# Patient Record
Sex: Female | Born: 1965 | Race: Black or African American | Hispanic: No | Marital: Married | Smoking: Never smoker
Health system: Southern US, Community
[De-identification: ages and names within clinical notes are randomized; demographics above are authoritative.]

## PROBLEM LIST (undated history)

## (undated) DIAGNOSIS — K219 Gastro-esophageal reflux disease without esophagitis: Secondary | ICD-10-CM

## (undated) DIAGNOSIS — M1990 Unspecified osteoarthritis, unspecified site: Secondary | ICD-10-CM

## (undated) DIAGNOSIS — D259 Leiomyoma of uterus, unspecified: Secondary | ICD-10-CM

## (undated) DIAGNOSIS — I10 Essential (primary) hypertension: Secondary | ICD-10-CM

## (undated) DIAGNOSIS — Z9109 Other allergy status, other than to drugs and biological substances: Secondary | ICD-10-CM

## (undated) DIAGNOSIS — N809 Endometriosis, unspecified: Secondary | ICD-10-CM

## (undated) DIAGNOSIS — E7800 Pure hypercholesterolemia, unspecified: Secondary | ICD-10-CM

## (undated) HISTORY — PX: ABDOMINAL HYSTERECTOMY: SHX81

## (undated) HISTORY — DX: Other allergy status, other than to drugs and biological substances: Z91.09

## (undated) HISTORY — DX: Endometriosis, unspecified: N80.9

## (undated) HISTORY — DX: Pure hypercholesterolemia, unspecified: E78.00

## (undated) HISTORY — DX: Essential (primary) hypertension: I10

## (undated) HISTORY — PX: OOPHORECTOMY: SHX86

## (undated) HISTORY — DX: Leiomyoma of uterus, unspecified: D25.9

---

## 2004-08-17 ENCOUNTER — Ambulatory Visit: Payer: Self-pay | Admitting: Unknown Physician Specialty

## 2005-08-18 ENCOUNTER — Ambulatory Visit: Payer: Self-pay | Admitting: Unknown Physician Specialty

## 2006-08-24 ENCOUNTER — Ambulatory Visit: Payer: Self-pay | Admitting: Unknown Physician Specialty

## 2007-08-28 ENCOUNTER — Ambulatory Visit: Payer: Self-pay | Admitting: Unknown Physician Specialty

## 2008-02-25 ENCOUNTER — Ambulatory Visit: Payer: Self-pay | Admitting: Unknown Physician Specialty

## 2008-05-10 ENCOUNTER — Ambulatory Visit: Payer: Self-pay | Admitting: Internal Medicine

## 2008-05-12 ENCOUNTER — Ambulatory Visit: Payer: Self-pay | Admitting: Internal Medicine

## 2008-09-02 ENCOUNTER — Ambulatory Visit: Payer: Self-pay | Admitting: Unknown Physician Specialty

## 2009-09-07 ENCOUNTER — Ambulatory Visit: Payer: Self-pay | Admitting: Unknown Physician Specialty

## 2010-03-04 ENCOUNTER — Ambulatory Visit: Payer: Self-pay | Admitting: Internal Medicine

## 2010-03-07 ENCOUNTER — Emergency Department: Payer: Self-pay | Admitting: Internal Medicine

## 2010-09-08 ENCOUNTER — Ambulatory Visit: Payer: Self-pay | Admitting: Unknown Physician Specialty

## 2011-09-28 ENCOUNTER — Ambulatory Visit: Payer: Self-pay

## 2011-09-28 LAB — HM MAMMOGRAPHY

## 2012-09-21 ENCOUNTER — Other Ambulatory Visit: Payer: Self-pay | Admitting: Internal Medicine

## 2012-09-21 MED ORDER — OMEPRAZOLE 20 MG PO CPDR: 20.0000 mg | DELAYED_RELEASE_CAPSULE | Freq: Every day | ORAL | Status: DC

## 2012-09-21 NOTE — Telephone Encounter (Signed)
Patient has an appointment on 3.11.14  Omeprazole DR 20 mg capsule

## 2012-09-21 NOTE — Telephone Encounter (Signed)
Sent in to pharmacy.  

## 2012-09-24 ENCOUNTER — Telehealth: Payer: Self-pay | Admitting: *Deleted

## 2012-09-24 NOTE — Telephone Encounter (Signed)
Called [phone] for prior authorization form being faxed over

## 2012-09-27 ENCOUNTER — Telehealth: Payer: Self-pay | Admitting: Internal Medicine

## 2012-09-27 NOTE — Telephone Encounter (Signed)
Kathleen Gould, Kathleen Gould,  DOB-10-07-1965, PCP- Dale Liberty.  Patient was seen by Dr. Lorin Picket at [facility] (? Sp).  Last seen July, 2013.  Patient has an appt at [facility] on  March 11th, 2014.   She  Is concerned she will be running out of her medication prior to visit.  Patient in NKDA, Hx HTN.  Medications are as follows-  (1) Felodipine ER 5mg  daily   (2) Omeprazole 20mg  daily  (3) Triameterene-HCTZ 37.5-25mg  daily and Lipitor 10mg  daily.   Patient uses [facility] [phone].  Please contact patient for medication management until appt.  [phone]

## 2012-09-27 NOTE — Telephone Encounter (Signed)
Please call pt.  Ok to refill meds until appt.

## 2012-09-28 MED ORDER — ATORVASTATIN CALCIUM 10 MG PO TABS: 10.0000 mg | ORAL_TABLET | Freq: Every day | ORAL | Status: DC

## 2012-09-28 MED ORDER — FELODIPINE ER 5 MG PO TB24: 5.0000 mg | ORAL_TABLET | Freq: Every day | ORAL | Status: DC

## 2012-09-28 MED ORDER — OMEPRAZOLE 20 MG PO CPDR: 20.0000 mg | DELAYED_RELEASE_CAPSULE | Freq: Every day | ORAL | Status: DC

## 2012-09-28 MED ORDER — TRIAMTERENE-HCTZ 37.5-25 MG PO TABS: 1.0000 | ORAL_TABLET | Freq: Every day | ORAL | Status: DC

## 2012-09-28 NOTE — Telephone Encounter (Signed)
Sent in to pharmacy. Spoke to patient's husband who stated that she needed refills on all medications. He also stated that patient had not received refill on omeprazole.

## 2012-10-01 LAB — HM MAMMOGRAPHY

## 2012-10-04 ENCOUNTER — Telehealth: Payer: Self-pay | Admitting: *Deleted

## 2012-10-04 NOTE — Telephone Encounter (Signed)
Called patient regarding denial of prior authorization;no answer. Message was left.

## 2012-10-17 ENCOUNTER — Telehealth: Payer: Self-pay | Admitting: *Deleted

## 2012-10-17 ENCOUNTER — Other Ambulatory Visit: Payer: Self-pay | Admitting: General Practice

## 2012-10-17 MED ORDER — OMEPRAZOLE 20 MG PO CPDR: 20.0000 mg | DELAYED_RELEASE_CAPSULE | Freq: Every day | ORAL | Status: DC

## 2012-10-17 NOTE — Telephone Encounter (Signed)
2nd message was left with patient regarding insurance not covering prilosec;letter sent

## 2012-10-17 NOTE — Telephone Encounter (Signed)
Pt stated she would just pay for medication out of pocket. Pt has an appt on 3/11 filled the Rx for 90 days because the cost would be cheaper.

## 2012-10-18 ENCOUNTER — Telehealth: Payer: Self-pay | Admitting: *Deleted

## 2012-10-18 NOTE — Telephone Encounter (Signed)
Called [phone] for prior authorization in the Omeprazole 20 mg, it was approved a approval noticed will be faxed over now; approval time Feb. 06, 2014- Feb 20,2015

## 2012-11-05 ENCOUNTER — Encounter: Payer: Self-pay | Admitting: Internal Medicine

## 2012-11-05 DIAGNOSIS — I10 Essential (primary) hypertension: Secondary | ICD-10-CM

## 2012-11-05 DIAGNOSIS — K219 Gastro-esophageal reflux disease without esophagitis: Secondary | ICD-10-CM | POA: Insufficient documentation

## 2012-11-05 DIAGNOSIS — E7800 Pure hypercholesterolemia, unspecified: Secondary | ICD-10-CM

## 2012-11-05 DIAGNOSIS — D649 Anemia, unspecified: Secondary | ICD-10-CM

## 2012-11-06 ENCOUNTER — Encounter: Payer: Self-pay | Admitting: Internal Medicine

## 2012-11-06 ENCOUNTER — Ambulatory Visit (INDEPENDENT_AMBULATORY_CARE_PROVIDER_SITE_OTHER): Payer: BC Managed Care – PPO | Admitting: Internal Medicine

## 2012-11-06 VITALS — BP 120/80 | HR 94 | Temp 98.3°F | Ht 66.0 in | Wt 208.0 lb

## 2012-11-06 DIAGNOSIS — D649 Anemia, unspecified: Secondary | ICD-10-CM

## 2012-11-06 DIAGNOSIS — E7800 Pure hypercholesterolemia, unspecified: Secondary | ICD-10-CM

## 2012-11-06 DIAGNOSIS — Z9109 Other allergy status, other than to drugs and biological substances: Secondary | ICD-10-CM

## 2012-11-06 DIAGNOSIS — I10 Essential (primary) hypertension: Secondary | ICD-10-CM

## 2012-11-06 DIAGNOSIS — K219 Gastro-esophageal reflux disease without esophagitis: Secondary | ICD-10-CM

## 2012-11-06 LAB — COMPREHENSIVE METABOLIC PANEL
ALT: 14 U/L (ref 0–35)
AST: 14 U/L (ref 0–37)
BUN: 11 mg/dL (ref 6–23)
Calcium: 9.7 mg/dL (ref 8.4–10.5)
Chloride: 102 mEq/L (ref 96–112)
Creatinine, Ser: 0.8 mg/dL (ref 0.4–1.2)
Total Bilirubin: 1 mg/dL (ref 0.3–1.2)

## 2012-11-06 LAB — CBC WITH DIFFERENTIAL/PLATELET
Basophils Absolute: 0.1 10*3/uL (ref 0.0–0.1)
Basophils Relative: 1.5 % (ref 0.0–3.0)
Eosinophils Absolute: 0.1 10*3/uL (ref 0.0–0.7)
HCT: 41.8 % (ref 36.0–46.0)
Hemoglobin: 14.2 g/dL (ref 12.0–15.0)
Lymphocytes Relative: 26.1 % (ref 12.0–46.0)
Lymphs Abs: 1.4 10*3/uL (ref 0.7–4.0)
MCHC: 34 g/dL (ref 30.0–36.0)
MCV: 86.3 fl (ref 78.0–100.0)
Monocytes Absolute: 0.3 10*3/uL (ref 0.1–1.0)
Neutro Abs: 3.6 10*3/uL (ref 1.4–7.7)
RBC: 4.85 Mil/uL (ref 3.87–5.11)
RDW: 13.9 % (ref 11.5–14.6)

## 2012-11-06 LAB — LIPID PANEL
Cholesterol: 225 mg/dL — ABNORMAL HIGH (ref 0–200)
HDL: 55.2 mg/dL (ref >39.00)
Total CHOL/HDL Ratio: 4
Triglycerides: 89 mg/dL (ref 0.0–149.0)

## 2012-11-06 MED ORDER — AMOXICILLIN 875 MG PO TABS: 875.0000 mg | ORAL_TABLET | Freq: Two times a day (BID) | ORAL | Status: DC

## 2012-11-06 MED ORDER — ATORVASTATIN CALCIUM 10 MG PO TABS: 10.0000 mg | ORAL_TABLET | Freq: Every day | ORAL | Status: DC

## 2012-11-06 MED ORDER — OMEPRAZOLE 20 MG PO CPDR: 20.0000 mg | DELAYED_RELEASE_CAPSULE | Freq: Every day | ORAL | Status: DC

## 2012-11-06 MED ORDER — FELODIPINE ER 5 MG PO TB24: 5.0000 mg | ORAL_TABLET | Freq: Every day | ORAL | Status: DC

## 2012-11-06 MED ORDER — TRIAMTERENE-HCTZ 37.5-25 MG PO TABS: 1.0000 | ORAL_TABLET | Freq: Every day | ORAL | Status: DC

## 2012-11-13 ENCOUNTER — Other Ambulatory Visit: Payer: Self-pay | Admitting: Internal Medicine

## 2012-11-13 DIAGNOSIS — D696 Thrombocytopenia, unspecified: Secondary | ICD-10-CM

## 2012-11-13 DIAGNOSIS — E876 Hypokalemia: Secondary | ICD-10-CM

## 2012-11-13 NOTE — Progress Notes (Signed)
Order placed for follow up labs 

## 2012-11-15 ENCOUNTER — Encounter: Payer: Self-pay | Admitting: *Deleted

## 2012-11-19 ENCOUNTER — Encounter: Payer: Self-pay | Admitting: Internal Medicine

## 2012-11-19 DIAGNOSIS — Z9109 Other allergy status, other than to drugs and biological substances: Secondary | ICD-10-CM | POA: Insufficient documentation

## 2012-11-19 NOTE — Assessment & Plan Note (Signed)
Follow.  Check cbc.

## 2012-11-19 NOTE — Assessment & Plan Note (Signed)
Appears to be controlled if she watches what she eats and takes her omeprazole.  Follow.

## 2012-11-19 NOTE — Assessment & Plan Note (Signed)
Treat current sinusitis as outlined.  Follow.

## 2012-11-19 NOTE — Assessment & Plan Note (Signed)
Low cholesterol diet and exercise.  Follow.  Check lipid panel.   

## 2012-11-19 NOTE — Assessment & Plan Note (Signed)
Blood pressure is doing well on current regimen.  Check metabolic panel.  Follow.

## 2012-11-19 NOTE — Progress Notes (Signed)
  Subjective:    Patient ID: Kathleen Gould, female    DOB: 23-Mar-1966, 47 y.o.   MRN: [id number]  HPI 47 year old female with past history of uterine fibroids and endometriosis, hypertension and hypercholesterolemia who comes in today for a scheduled follow up.  She states she has been doing relatively well.  She does have increased nasal congestion and sinus pressure.  Some increased pressure in her ears.  Thick yellow mucus production.  No chest congestion.  No sob.  Acid reflux is controlled if she watches what she eats.  Tomasa Blase will aggravate.  On omeprazole.  Sees Dr Alben SpittleNedra Hai for her gyn care.  States she is up to date.  Had her mammogram last month.  Bowels stable.    Past Medical History  Diagnosis Date  . Hypertension   . Endometriosis   . Hypercholesterolemia   . Uterine fibroid   . Environmental allergies     Review of Systems Patient denies any headache, lightheadedness or dizziness.  Sinus pressure and congestion as outlined.  No chest pain, tightness or palpitations.  No increased shortness of breath.  No nausea or vomiting. On omeprazole.  Acid reflux appears to be controlled if she watches what she eats.  No abdominal pain or cramping.  No bowel change, such as diarrhea, constipation, BRBPR or melana.  No urine change.        Objective:   Physical Exam Filed Vitals:   11/06/12 1010  BP: 120/80  Pulse: 94  Temp: 98.3 F (36.8 C)   Blood pressure recheck:  128/72, pulse 99  47 year old female in no acute distress.   HEENT:  Nares- clear except for slightly erythematous turbinates.  Minimal tenderness to palpation over the sinuses.  Oropharynx - without lesions. NECK:  Supple.  Nontender.  No audible bruit.  HEART:  Appears to be regular. LUNGS:  No crackles or wheezing audible.  Respirations even and unlabored.  RADIAL PULSE:  Equal bilaterally.    BREASTS:  Performed by gyn.   ABDOMEN:  Soft, nontender.  Bowel sounds present and normal.  No audible abdominal  bruit.  GU:  Performed by gyn.    EXTREMITIES:  No increased edema present.  DP pulses palpable and equal bilaterally.          Assessment & Plan:  PROBABLE SINUSITIS.  Saline nasal spray and flonase as directed.  Mucinex/robitussin as directed.  Will give her a prescription for amoxicillin 875mg  if needed.  Follow.    HEALTH MAINTENANCE.  Breasts, pelvic and pap smears through [facility].  Up to date per her report.  Last mammogram I have is 09/28/11 - BiRADS I.  Obtain records of latest mammogram.  Colonoscopy 7/09 - internal hemorrhoids.

## 2012-11-29 ENCOUNTER — Telehealth: Payer: Self-pay | Admitting: *Deleted

## 2012-11-29 ENCOUNTER — Other Ambulatory Visit (INDEPENDENT_AMBULATORY_CARE_PROVIDER_SITE_OTHER): Payer: BC Managed Care – PPO

## 2012-11-29 DIAGNOSIS — E876 Hypokalemia: Secondary | ICD-10-CM

## 2012-11-29 DIAGNOSIS — D696 Thrombocytopenia, unspecified: Secondary | ICD-10-CM

## 2012-11-29 LAB — PLATELET COUNT: Platelets: 470 10*3/uL — ABNORMAL HIGH (ref 150–400)

## 2012-11-29 NOTE — Telephone Encounter (Signed)
Pt came in for labs and states that the last time she was here she it was for being nauseas and states that she tired to get a hold of someone to let them know that she still is not felling better and that she is still nausea and throwing up, also that she went to [facility] clinic on November 19, 2012 for the nausea and they did blood and urine test to see if they could find anything, she states they gave her phenergan and that didn't help at all.

## 2012-11-29 NOTE — Telephone Encounter (Signed)
If she is continuing to feel sick on her stomach - she needs reevaluation.  Need to know how often - emesis, etc.  Also need records from [place].  Any other symptoms.

## 2012-11-29 NOTE — Telephone Encounter (Signed)
Patient said that she vomited Saturday but she still feels nausea on and off all the. Has bad headaches but not all the time.

## 2012-11-30 ENCOUNTER — Ambulatory Visit (INDEPENDENT_AMBULATORY_CARE_PROVIDER_SITE_OTHER): Payer: BC Managed Care – PPO | Admitting: Internal Medicine

## 2012-11-30 ENCOUNTER — Encounter: Payer: Self-pay | Admitting: Internal Medicine

## 2012-11-30 ENCOUNTER — Ambulatory Visit: Payer: BC Managed Care – PPO

## 2012-11-30 VITALS — BP 130/68 | HR 86 | Temp 98.6°F | Resp 18 | Wt 206.5 lb

## 2012-11-30 DIAGNOSIS — K219 Gastro-esophageal reflux disease without esophagitis: Secondary | ICD-10-CM

## 2012-11-30 DIAGNOSIS — R110 Nausea: Secondary | ICD-10-CM

## 2012-11-30 DIAGNOSIS — I10 Essential (primary) hypertension: Secondary | ICD-10-CM

## 2012-11-30 NOTE — Telephone Encounter (Signed)
Patient called back and said that she could be in office by 4:00 pm

## 2012-11-30 NOTE — Telephone Encounter (Signed)
Left message for patient to return call.

## 2012-11-30 NOTE — Telephone Encounter (Signed)
See if she can come in today at the 11:30 spot.  Will need to be evaluated if symptoms are persistent.

## 2012-11-30 NOTE — Patient Instructions (Signed)
I want you to increase your prilosec (omeprazole) 20mg  - one tablet 2x/day.  I am also going to schedule you for an abdominal ultrasound.

## 2012-12-02 ENCOUNTER — Encounter: Payer: Self-pay | Admitting: Internal Medicine

## 2012-12-02 NOTE — Assessment & Plan Note (Signed)
Blood pressure is doing well on current regimen.  Check metabolic panel.  Follow.  

## 2012-12-02 NOTE — Progress Notes (Signed)
  Subjective:    Patient ID: Kathleen Gould, female    DOB: 1966-04-16, 47 y.o.   MRN: [id number]  HPI 47 year old female with past history of uterine fibroids and endometriosis, hypertension and hypercholesterolemia who comes in today as a work in with concerns regarding persistent nausea.  Last visit, she had issues with increased sinus congestion and drainage.  The nausea was felt to be related to the increased drainage.  Sinus treated and improved.  Still with persistent nausea.  11/17/12 - nausea and minimal headache.  Emesis x 2.  Since, has had persistent nausea.  No further emesis.  Decreased appetite.  Just not hungry.  Eating - knows she has to.  One runny stool, but since minimal constipation.  No significant bowel change.  Bowels regular.  [facility]Dr Particia Nearing).  Labs unrevealing.  Urine unrevealing.  In today, because of persistence.  Some better today.   Sees Dr Alben SpittleNedra Hai for her gyn care.  States she is up to date.   No change in her meds.  Taking prilosec.      Past Medical History  Diagnosis Date  . Hypertension   . Endometriosis   . Hypercholesterolemia   . Uterine fibroid   . Environmental allergies     Review of Systems Patient denies any headache now.  No lightheadedness or dizziness.  Sinus pressure and congestion have essentially resolved.  Minimal drainage.  No chest pain, tightness or palpitations.  No increased shortness of breath.  Nausea as outlined.  No further vomiting.  No abdominal pain or cramping.  No significant bowel change.  Possible minimal constipation.  No BRBPR or melana.  No urine change.  No significant acid reflux.  She is taking prilosec daily.   Has had phenergan.  Not helping.      Objective:   Physical Exam  Filed Vitals:   11/30/12 1615  BP: 130/68  Pulse: 86  Temp: 98.6 F (37 C)  Resp: 66   47 year old female in no acute distress.   HEENT:  Nares- clear.  Oropharynx - without lesions. NECK:  Supple.   Nontender.  No audible bruit.  HEART:  Appears to be regular. LUNGS:  No crackles or wheezing audible.  Respirations even and unlabored.  RADIAL PULSE:  Equal bilaterally.   ABDOMEN:  Soft, nontender.  Bowel sounds present and normal.  No audible abdominal bruit.    EXTREMITIES:  No increased edema present.  DP pulses palpable and equal bilaterally.          Assessment & Plan:  PREVIOUS SINUSITIS.  Essentially resolved.  Continue nasal sprays.  Follow.    PERSISTENT NAUSEA.  Unclear etiology.  Sinus infection essentially resolved.  Eating.  Does not appear to change for better or worse with eating.  No pain.  Decreased appetite.  No new meds.  Will check liver panel, amylase and lipase.  Also recheck cbc and met b.  Will increase prilosec to bid.  Check abdominal ultrasound.  Further w/up pending above results.  Some better today.    HEALTH MAINTENANCE.  Breasts, pelvic and pap smears through [facility].  Up to date per her report.  Last mammogram I have is 09/28/11 - BiRADS I.  Obtain records of latest mammogram.  Colonoscopy 7/09 - internal hemorrhoids.

## 2012-12-02 NOTE — Assessment & Plan Note (Signed)
On omeprazole daily.  Will increase to bid.  Follow.

## 2012-12-03 LAB — HEPATIC FUNCTION PANEL
Albumin: 4.2 g/dL (ref 3.5–5.2)
Alkaline Phosphatase: 53 U/L (ref 39–117)

## 2012-12-03 LAB — AMYLASE: Amylase: 50 U/L (ref 27–131)

## 2012-12-03 LAB — LIPASE: Lipase: 16 U/L (ref 11.0–59.0)

## 2012-12-19 ENCOUNTER — Ambulatory Visit: Payer: Self-pay | Admitting: Internal Medicine

## 2013-01-12 ENCOUNTER — Emergency Department: Payer: Self-pay | Admitting: Emergency Medicine

## 2013-02-21 ENCOUNTER — Encounter: Payer: Self-pay | Admitting: Internal Medicine

## 2013-03-07 ENCOUNTER — Ambulatory Visit (INDEPENDENT_AMBULATORY_CARE_PROVIDER_SITE_OTHER): Payer: BC Managed Care – PPO | Admitting: Internal Medicine

## 2013-03-07 ENCOUNTER — Encounter: Payer: Self-pay | Admitting: Internal Medicine

## 2013-03-07 VITALS — BP 120/70 | HR 86 | Temp 98.3°F | Ht 66.0 in | Wt 208.5 lb

## 2013-03-07 DIAGNOSIS — D649 Anemia, unspecified: Secondary | ICD-10-CM

## 2013-03-07 DIAGNOSIS — R350 Frequency of micturition: Secondary | ICD-10-CM

## 2013-03-07 DIAGNOSIS — Z9109 Other allergy status, other than to drugs and biological substances: Secondary | ICD-10-CM

## 2013-03-07 DIAGNOSIS — I10 Essential (primary) hypertension: Secondary | ICD-10-CM

## 2013-03-07 DIAGNOSIS — E7800 Pure hypercholesterolemia, unspecified: Secondary | ICD-10-CM

## 2013-03-07 DIAGNOSIS — K219 Gastro-esophageal reflux disease without esophagitis: Secondary | ICD-10-CM

## 2013-03-07 LAB — CBC WITH DIFFERENTIAL/PLATELET
Basophils Relative: 0.5 % (ref 0.0–3.0)
Hemoglobin: 13.7 g/dL (ref 12.0–15.0)
Lymphocytes Relative: 31.9 % (ref 12.0–46.0)
Monocytes Relative: 7.1 % (ref 3.0–12.0)
Neutro Abs: 3.1 10*3/uL (ref 1.4–7.7)
RBC: 4.66 Mil/uL (ref 3.87–5.11)

## 2013-03-07 LAB — BASIC METABOLIC PANEL
GFR: 78.82 mL/min (ref >60.00)
Glucose, Bld: 91 mg/dL (ref 70–99)
Potassium: 4.1 mEq/L (ref 3.5–5.1)
Sodium: 140 mEq/L (ref 135–145)

## 2013-03-07 LAB — URINALYSIS, ROUTINE W REFLEX MICROSCOPIC
Bilirubin Urine: NEGATIVE
Hgb urine dipstick: NEGATIVE
Ketones, ur: NEGATIVE
Leukocytes, UA: NEGATIVE
Nitrite: NEGATIVE
RBC / HPF: NONE SEEN
Specific Gravity, Urine: 1.02
Total Protein, Urine: NEGATIVE
Urine Glucose: NEGATIVE
Urobilinogen, UA: 0.2
pH: 8.5 (ref 5.0–8.0)

## 2013-03-07 LAB — HEPATIC FUNCTION PANEL
Albumin: 4.3 g/dL (ref 3.5–5.2)
Bilirubin, Direct: 0.1 mg/dL (ref 0.0–0.3)
Total Protein: 8 g/dL (ref 6.0–8.3)

## 2013-03-07 LAB — LIPID PANEL
Cholesterol: 275 mg/dL — ABNORMAL HIGH (ref 0–200)
HDL: 73.3 mg/dL (ref >39.00)
VLDL: 26 mg/dL (ref 0.0–40.0)

## 2013-03-07 LAB — LDL CHOLESTEROL, DIRECT: Direct LDL: 194.3 mg/dL

## 2013-03-10 ENCOUNTER — Encounter: Payer: Self-pay | Admitting: Internal Medicine

## 2013-03-10 LAB — CULTURE, URINE COMPREHENSIVE: Colony Count: 15000

## 2013-03-10 NOTE — Progress Notes (Signed)
  Subjective:    Patient ID: Kathleen Gould, female    DOB: November 30, 1965, 47 y.o.   MRN: [id number]  HPI 47 year old female with past history of uterine fibroids and endometriosis, hypertension and hypercholesterolemia who comes in today for a scheduled follow up.   Sees Dr Alben SpittleNedra Hai for her gyn care.  States she is up to date.   No change in her meds.      Past Medical History  Diagnosis Date  . Hypertension   . Endometriosis   . Hypercholesterolemia   . Uterine fibroid   . Environmental allergies     Review of Systems Patient denies any headache now.  No lightheadedness or dizziness.  Sinus pressure and congestion have essentially resolved.  Minimal drainage.  No chest pain, tightness or palpitations.  No increased shortness of breath.  Nausea as outlined.  No further vomiting.  No abdominal pain or cramping.  No significant bowel change.  Possible minimal constipation.  No BRBPR or melana.  No urine change.  No significant acid reflux.  She is taking prilosec daily.   Has had phenergan.  Not helping.      Objective:   Physical Exam  Filed Vitals:   03/07/13 0854  BP: 120/70  Pulse: 86  Temp: 98.3 F (36.8 C)   Blood pressure recheck:  122/76, pulse 32  47 year old female in no acute distress.   HEENT:  Nares- clear.  Oropharynx - without lesions. NECK:  Supple.  Nontender.  No audible bruit.  HEART:  Appears to be regular. LUNGS:  No crackles or wheezing audible.  Respirations even and unlabored.  RADIAL PULSE:  Equal bilaterally.   ABDOMEN:  Soft, nontender.  Bowel sounds present and normal.  No audible abdominal bruit.    EXTREMITIES:  No increased edema present.  DP pulses palpable and equal bilaterally.          Assessment & Plan:  PREVIOUS NAUSEA.  Resolved.  INCREASED URINARY FREQUENCY.  Will check urinalysis and culture to confirm no infection.     PREVIOUS HEAD INJURY.  Back to baseline.  All symptoms resolved.      HEALTH MAINTENANCE.  Breasts, pelvic  and pap smears through [facility].  Up to date per her report.  Last mammogram I have is 09/28/11 - BiRADS I.  Obtain records of latest mammogram.  Colonoscopy 7/09 - internal hemorrhoids.

## 2013-03-10 NOTE — Assessment & Plan Note (Signed)
Stable

## 2013-03-10 NOTE — Assessment & Plan Note (Signed)
Follow cbc.  

## 2013-03-10 NOTE — Assessment & Plan Note (Signed)
Blood pressure is doing well on current regimen.  Follow.   

## 2013-03-10 NOTE — Assessment & Plan Note (Signed)
Low cholesterol diet and exercise.  Follow lipid panel.   

## 2013-03-10 NOTE — Assessment & Plan Note (Signed)
On omeprazole.  Doing well.  Follow.  

## 2013-03-13 ENCOUNTER — Other Ambulatory Visit: Payer: Self-pay | Admitting: *Deleted

## 2013-03-13 MED ORDER — ATORVASTATIN CALCIUM 20 MG PO TABS: 20.0000 mg | ORAL_TABLET | Freq: Every day | ORAL | Status: DC

## 2013-03-13 MED ORDER — SULFAMETHOXAZOLE-TMP DS 800-160 MG PO TABS: 1.0000 | ORAL_TABLET | Freq: Two times a day (BID) | ORAL | Status: DC

## 2013-03-21 ENCOUNTER — Encounter: Payer: Self-pay | Admitting: Internal Medicine

## 2013-04-18 ENCOUNTER — Encounter: Payer: Self-pay | Admitting: Adult Health

## 2013-04-18 ENCOUNTER — Ambulatory Visit (INDEPENDENT_AMBULATORY_CARE_PROVIDER_SITE_OTHER): Payer: BC Managed Care – PPO | Admitting: Adult Health

## 2013-04-18 VITALS — BP 130/82 | HR 82 | Temp 98.0°F | Resp 12 | Wt 211.5 lb

## 2013-04-18 DIAGNOSIS — K5900 Constipation, unspecified: Secondary | ICD-10-CM

## 2013-04-18 NOTE — Assessment & Plan Note (Signed)
Patient reports 5 days of constipation with little results with fleets enema x 2. She presents with RLQ pain on exam. Chronic nausea. Cannot r/o chronic appendicitis given pain and nausea. Recommend abdominal CT. Patient refused. Stated it was too expensive. She felt the recent ultrasound should have sufficed. Explained that I could not safely recommend a cathartic with her RLQ pain and chronic nausea worsening in the last 5 days without identifying whether appendix involved and risk rupture. Patient stated she would come back and see Dr. Lorin Picket when she was here. In the meantime, patient stated "I will just take something over the counter".

## 2013-04-18 NOTE — Progress Notes (Signed)
  Subjective:    Patient ID: Kathleen Gould, female    DOB: 01/19/1966, 47 y.o.   MRN: [id number]  HPI  Patient presents to clinic with c/o of no BM since Saturday. She reports having a normal BM on Saturday. She has tried 2 fleets enema with little results. Reports eating regular size meals. No changes in diet. She eats lots of fruits and vegetables. Reports ongoing nausea "off and on" for months with worsening in the last 5 days. She denies hx of constipation. She normally moves her bowels daily. She is passing flatus. She feels bloated. She has not taken anything orally to relieve constipation. She c/o of occasional right sided abdominal pain. Recently had ultrasound which showed no gallstones and was essentially normal. Denies fever or other symptoms.   Current Outpatient Prescriptions on File Prior to Visit  Medication Sig Dispense Refill  . atorvastatin (LIPITOR) 20 MG tablet Take 1 tablet (20 mg total) by mouth daily.  30 tablet  5  . felodipine (PLENDIL) 5 MG 24 hr tablet Take 1 tablet (5 mg total) by mouth daily.  90 tablet  1  . omeprazole (PRILOSEC) 20 MG capsule Take 1 capsule (20 mg total) by mouth daily.  90 capsule  1  . triamterene-hydrochlorothiazide (MAXZIDE-25) 37.5-25 MG per tablet Take 1 each (1 tablet total) by mouth daily.  90 tablet  1   No current facility-administered medications on file prior to visit.    Review of Systems  Constitutional: Negative for fever.  Respiratory: Negative.   Cardiovascular: Negative.   Gastrointestinal: Positive for nausea, constipation and abdominal distention. Negative for blood in stool.       Reports abdominal pain right side - "not too severe"    BP 130/82  Pulse 82  Temp(Src) 98 F (36.7 C) (Oral)  Resp 12  Wt 211 lb 8 oz (95.936 kg)  BMI 34.15 kg/m2  SpO2 97%  LMP 11/06/2000    Objective:   Physical Exam  Constitutional: She is oriented to person, place, and time.  Obese female c/o constipation and occasional right  side pain.  Cardiovascular: Normal rate and regular rhythm.   Pulmonary/Chest: Effort normal. No respiratory distress.  Abdominal: Soft. Bowel sounds are normal. She exhibits distension. She exhibits no mass. There is tenderness. There is rebound. There is no guarding.  Neurological: She is alert and oriented to person, place, and time.  Skin: Skin is warm and dry.  Psychiatric: She has a normal mood and affect.      Assessment & Plan:

## 2013-07-02 ENCOUNTER — Other Ambulatory Visit: Payer: Self-pay | Admitting: Internal Medicine

## 2013-07-02 NOTE — Telephone Encounter (Signed)
Eprescribed.

## 2013-07-23 ENCOUNTER — Encounter: Payer: Self-pay | Admitting: Internal Medicine

## 2013-07-23 ENCOUNTER — Ambulatory Visit (INDEPENDENT_AMBULATORY_CARE_PROVIDER_SITE_OTHER): Payer: BC Managed Care – PPO | Admitting: Internal Medicine

## 2013-07-23 VITALS — BP 122/90 | HR 85 | Temp 98.3°F | Ht 65.75 in | Wt 208.2 lb

## 2013-07-23 DIAGNOSIS — Z1239 Encounter for other screening for malignant neoplasm of breast: Secondary | ICD-10-CM

## 2013-07-23 DIAGNOSIS — J329 Chronic sinusitis, unspecified: Secondary | ICD-10-CM

## 2013-07-23 DIAGNOSIS — R4586 Emotional lability: Secondary | ICD-10-CM

## 2013-07-23 DIAGNOSIS — Z9109 Other allergy status, other than to drugs and biological substances: Secondary | ICD-10-CM

## 2013-07-23 DIAGNOSIS — K5900 Constipation, unspecified: Secondary | ICD-10-CM

## 2013-07-23 DIAGNOSIS — Z1211 Encounter for screening for malignant neoplasm of colon: Secondary | ICD-10-CM

## 2013-07-23 DIAGNOSIS — D473 Essential (hemorrhagic) thrombocythemia: Secondary | ICD-10-CM

## 2013-07-23 DIAGNOSIS — D649 Anemia, unspecified: Secondary | ICD-10-CM

## 2013-07-23 DIAGNOSIS — E7800 Pure hypercholesterolemia, unspecified: Secondary | ICD-10-CM

## 2013-07-23 DIAGNOSIS — I10 Essential (primary) hypertension: Secondary | ICD-10-CM

## 2013-07-23 DIAGNOSIS — K219 Gastro-esophageal reflux disease without esophagitis: Secondary | ICD-10-CM

## 2013-07-23 DIAGNOSIS — F39 Unspecified mood [affective] disorder: Secondary | ICD-10-CM

## 2013-07-23 NOTE — Progress Notes (Signed)
Pre-visit discussion using our clinic review tool. No additional management support is needed unless otherwise documented below in the visit note.  

## 2013-07-23 NOTE — Progress Notes (Signed)
  Subjective:    Patient ID: Hal Morales, female    DOB: 1966/05/25, 47 y.o.   MRN: [id number]  HPI 47 year old female with past history of uterine fibroids and endometriosis, hypertension and hypercholesterolemia who comes in today to follow up on these issues as well as for a complete physical exam.  She reports having problems recently with increased nasal congestion.  Thick yellow mucus production.  No fever.  No chest congestion.  Has been using saline nasal flushes and has taken some mucinex.  Symptoms started approximately one month ago and gradually worsened.  Overall otherwise she feels she is doing well.  Breathing stable.  No nausea or vomiting.  No abdominal pain.  Bowels better.  No problems with constipation now.  She does report some mood swings.  Noticed more recently.        Past Medical History  Diagnosis Date  . Hypertension   . Endometriosis   . Hypercholesterolemia   . Uterine fibroid   . Environmental allergies     Outpatient Encounter Prescriptions as of 07/23/2013  Medication Sig  . atorvastatin (LIPITOR) 20 MG tablet Take 1 tablet (20 mg total) by mouth daily.  . felodipine (PLENDIL) 5 MG 24 hr tablet TAKE 1 TABLET BY MOUTH EVERY DAY  . omeprazole (PRILOSEC) 20 MG capsule TAKE 1 CAPSULE (20 MG TOTAL) BY MOUTH DAILY.  Marland Kitchen triamterene-hydrochlorothiazide (MAXZIDE-25) 37.5-25 MG per tablet TAKE 1 TABLET BY MOUTH EVERY DAY  . [DISCONTINUED] omeprazole (PRILOSEC) 20 MG capsule Take 1 capsule (20 mg total) by mouth daily.    Review of Systems Patient denies any headache.  No lightheadedness or dizziness.  Sinus pressure and congestion as outlined.  Thick mucus production.   Minimal drainage.  No chest pain, tightness or palpitations.  No increased shortness of breath.  No chest congestion.  No abdominal pain or cramping.  No bowel change now.  Constipation has improved.   No BRBPR or melana.  No urine change.  No significant acid reflux.  She is taking prilosec.       Objective:   Physical Exam  Filed Vitals:   07/23/13 1338  BP: 122/90  Pulse: 85  Temp: 98.3 F (36.8 C)   Blood pressure recheck:  118/78, pulse 9  47 year old female in no acute distress.   HEENT:  Nares- clear.  Oropharynx - without lesions. NECK:  Supple.  Nontender.  No audible bruit.  HEART:  Appears to be regular. LUNGS:  No crackles or wheezing audible.  Respirations even and unlabored.  RADIAL PULSE:  Equal bilaterally.    BREASTS:  No nipple discharge or nipple retraction present.  Could not appreciate any distinct nodules or axillary adenopathy.  ABDOMEN:  Soft, nontender.  Bowel sounds present and normal.  No audible abdominal bruit.    EXTREMITIES:  No increased edema present.  DP pulses palpable and equal bilaterally.          Assessment & Plan:  PREVIOUS NAUSEA.  Resolved.  HEALTH MAINTENANCE.  Physical today.  States had mammogram 09/2012.  Had at [facility].   Obtain records of latest mammogram.  Colonoscopy 7/09 - internal hemorrhoids.  IFOB given.  Schedule mammogram when due.

## 2013-07-24 ENCOUNTER — Other Ambulatory Visit (INDEPENDENT_AMBULATORY_CARE_PROVIDER_SITE_OTHER): Payer: BC Managed Care – PPO

## 2013-07-24 DIAGNOSIS — D649 Anemia, unspecified: Secondary | ICD-10-CM

## 2013-07-24 DIAGNOSIS — I10 Essential (primary) hypertension: Secondary | ICD-10-CM

## 2013-07-24 DIAGNOSIS — E7800 Pure hypercholesterolemia, unspecified: Secondary | ICD-10-CM

## 2013-07-24 LAB — TSH: TSH: 1.39 u[IU]/mL (ref 0.35–5.50)

## 2013-07-24 LAB — COMPREHENSIVE METABOLIC PANEL
AST: 16 U/L (ref 0–37)
Albumin: 4.1 g/dL (ref 3.5–5.2)
Alkaline Phosphatase: 64 U/L (ref 39–117)
CO2: 30 mEq/L (ref 19–32)
Chloride: 101 mEq/L (ref 96–112)
GFR: 88.59 mL/min (ref >60.00)
Glucose, Bld: 80 mg/dL (ref 70–99)
Potassium: 3.3 mEq/L — ABNORMAL LOW (ref 3.5–5.1)
Sodium: 136 mEq/L (ref 135–145)
Total Protein: 7.9 g/dL (ref 6.0–8.3)

## 2013-07-24 LAB — CBC WITH DIFFERENTIAL/PLATELET
Eosinophils Relative: 2.9 % (ref 0.0–5.0)
HCT: 41.1 % (ref 36.0–46.0)
Lymphocytes Relative: 31.4 % (ref 12.0–46.0)
MCV: 85.8 fl (ref 78.0–100.0)
Monocytes Absolute: 0.3 10*3/uL (ref 0.1–1.0)
Monocytes Relative: 5.8 % (ref 3.0–12.0)
Neutrophils Relative %: 59.3 % (ref 43.0–77.0)
Platelets: 457 10*3/uL — ABNORMAL HIGH (ref 150.0–400.0)
RBC: 4.79 Mil/uL (ref 3.87–5.11)
WBC: 4.7 10*3/uL (ref 4.5–10.5)

## 2013-07-24 LAB — LIPID PANEL
Total CHOL/HDL Ratio: 3
VLDL: 15.2 mg/dL (ref 0.0–40.0)

## 2013-07-28 ENCOUNTER — Encounter: Payer: Self-pay | Admitting: Internal Medicine

## 2013-07-28 DIAGNOSIS — F39 Unspecified mood [affective] disorder: Secondary | ICD-10-CM | POA: Insufficient documentation

## 2013-07-28 DIAGNOSIS — D75839 Thrombocytosis, unspecified: Secondary | ICD-10-CM | POA: Insufficient documentation

## 2013-07-28 DIAGNOSIS — J329 Chronic sinusitis, unspecified: Secondary | ICD-10-CM | POA: Insufficient documentation

## 2013-07-28 DIAGNOSIS — D473 Essential (hemorrhagic) thrombocythemia: Secondary | ICD-10-CM | POA: Insufficient documentation

## 2013-07-28 DIAGNOSIS — R4586 Emotional lability: Secondary | ICD-10-CM | POA: Insufficient documentation

## 2013-07-28 NOTE — Assessment & Plan Note (Signed)
Low cholesterol diet and exercise.  Follow lipid panel.   

## 2013-07-28 NOTE — Assessment & Plan Note (Signed)
Recheck cbc.  Follow.  

## 2013-07-28 NOTE — Assessment & Plan Note (Signed)
mucinex as directed.  Saline flushes.  Antibiotic if needed or if symptoms worsen.

## 2013-07-28 NOTE — Assessment & Plan Note (Signed)
Blood pressure is doing well on current regimen.  Follow.   

## 2013-07-28 NOTE — Assessment & Plan Note (Signed)
Resolved.  Not an issue now.   

## 2013-07-28 NOTE — Assessment & Plan Note (Signed)
Follow cbc.  

## 2013-07-28 NOTE — Assessment & Plan Note (Signed)
On omeprazole.  Doing well.  Follow.  

## 2013-07-28 NOTE — Assessment & Plan Note (Signed)
Stable

## 2013-07-28 NOTE — Assessment & Plan Note (Signed)
Has noticed some increased in mood swings.  Follow.  Hold on further medication at this time.  Follow.

## 2013-07-29 ENCOUNTER — Telehealth: Payer: Self-pay | Admitting: Internal Medicine

## 2013-07-29 MED ORDER — AMOXICILLIN 875 MG PO TABS: 875.0000 mg | ORAL_TABLET | Freq: Two times a day (BID) | ORAL | Status: DC

## 2013-07-29 NOTE — Telephone Encounter (Signed)
Patient Information:  Caller Name: Kathleen Gould  Phone: [phone]  Patient: Kathleen Gould  Gender: Female  DOB: [birth year]  Age: 47 Years  PCP: Kathleen Gould  Pregnant: No  Office Follow Up:  Does the office need to follow up with this patient?: Yes  Instructions For The Office: Request abx  RN Note:  Continue previous instructions by provider for cold s/s.  Symptoms  Reason For Call & Symptoms: Congested, nauseated, cough that is dry; Onset off and on x 1 month but seems worse since wknd.  Seen in office 07/23/13 and following home care instructions but feels worse today 07/29/13.  Reviewed Health History In EMR: Yes  Reviewed Medications In EMR: Yes  Reviewed Allergies In EMR: Yes  Reviewed Surgeries / Procedures: Yes  Date of Onset of Symptoms: 08/02/2013  Treatments Tried: Mucinex, Robitussin, Flonase  Treatments Tried Worked: No OB / GYN:  LMP: Unknown  Guideline(s) Used:  Colds  Disposition Per Guideline:   See Today or Tomorrow in Office  Reason For Disposition Reached:   Sinus congestion (pressure, fullness) present > 10 days  Advice Given:  Call Back If:  Difficulty breathing occurs  Fever lasts more than 3 days  Nasal discharge lasts more than 10 days  Cough lasts more than 3 weeks  You become worse  Patient Refused Recommendation:  Patient Requests Prescription  Prefers script for abx vs ov due to seen 07/23/13.

## 2013-07-29 NOTE — Telephone Encounter (Signed)
Spoke to pt.  She was just evaluated.  Informed her if symptoms did not improve with what was suggested at the visit, that we would prescribe abx.  Amoxicillin 875mg  bid x 10 days.  She was instructed to call if symptoms changed, worsened or did not resolve.  Offered evaluation.  She did not feel needed eval.

## 2013-08-14 ENCOUNTER — Encounter: Payer: Self-pay | Admitting: Internal Medicine

## 2013-08-15 ENCOUNTER — Encounter: Payer: Self-pay | Admitting: *Deleted

## 2013-08-15 NOTE — Progress Notes (Signed)
Mailed letter °

## 2013-08-23 ENCOUNTER — Telehealth: Payer: Self-pay | Admitting: Internal Medicine

## 2013-08-23 NOTE — Telephone Encounter (Signed)
States she received letter to call regarding results and possible changes in meds.  States she was to come back in for f/u testing w/in a week but not sure if now is best time due to persistent diarrhea and not feeling well.  Asking for a call.

## 2013-09-04 MED ORDER — ROSUVASTATIN CALCIUM 5 MG PO TABS: ORAL_TABLET | ORAL | Status: DC

## 2013-09-04 NOTE — Telephone Encounter (Signed)
Pt agreed to try the Crestor 5mg  on Monday, Wed, & Friday. Sent Rx to [place]

## 2013-09-04 NOTE — Addendum Note (Signed)
Addended by: Leeanne Rio on: 09/04/2013 03:57 PM   Modules accepted: Orders

## 2013-10-31 ENCOUNTER — Ambulatory Visit: Payer: Self-pay | Admitting: Internal Medicine

## 2013-10-31 LAB — HM MAMMOGRAPHY: HM Mammogram: NEGATIVE

## 2013-11-02 ENCOUNTER — Encounter: Payer: Self-pay | Admitting: Internal Medicine

## 2013-11-21 ENCOUNTER — Encounter: Payer: Self-pay | Admitting: Internal Medicine

## 2013-11-21 ENCOUNTER — Ambulatory Visit (INDEPENDENT_AMBULATORY_CARE_PROVIDER_SITE_OTHER): Payer: BC Managed Care – PPO | Admitting: Internal Medicine

## 2013-11-21 VITALS — BP 110/70 | HR 91 | Temp 98.5°F | Ht 65.75 in | Wt 209.0 lb

## 2013-11-21 DIAGNOSIS — D473 Essential (hemorrhagic) thrombocythemia: Secondary | ICD-10-CM

## 2013-11-21 DIAGNOSIS — D649 Anemia, unspecified: Secondary | ICD-10-CM

## 2013-11-21 DIAGNOSIS — E7800 Pure hypercholesterolemia, unspecified: Secondary | ICD-10-CM

## 2013-11-21 DIAGNOSIS — K5900 Constipation, unspecified: Secondary | ICD-10-CM

## 2013-11-21 DIAGNOSIS — I10 Essential (primary) hypertension: Secondary | ICD-10-CM

## 2013-11-21 DIAGNOSIS — D75839 Thrombocytosis, unspecified: Secondary | ICD-10-CM

## 2013-11-21 DIAGNOSIS — Z9109 Other allergy status, other than to drugs and biological substances: Secondary | ICD-10-CM

## 2013-11-21 DIAGNOSIS — K219 Gastro-esophageal reflux disease without esophagitis: Secondary | ICD-10-CM

## 2013-11-21 NOTE — Assessment & Plan Note (Addendum)
On omeprazole.  Some nausea.  Consider increasing omeprazole to bid.  Follow.  Get her back in soon to reassess.  Treat allergies.

## 2013-11-21 NOTE — Progress Notes (Signed)
Pre-visit discussion using our clinic review tool. No additional management support is needed unless otherwise documented below in the visit note.  

## 2013-11-21 NOTE — Progress Notes (Signed)
  Subjective:    Patient ID: Kathleen Gould, female    DOB: 17-Feb-1966, 48 y.o.   MRN: [id number]  HPI 48 year old female with past history of uterine fibroids and endometriosis, hypertension and hypercholesterolemia who comes in today for a scheduled follow up.   She reports having problems recently with increased nasal congestion, head congestion, increased post nasal drainage and cough.  No fever.  No chest congestion.  Has been using saline nasal flushes and has taken some robitussin.  Some nausea associated.  No vomiting.   Breathing stable.  No abdominal pain.  Bowels stable.  No problems with constipation now.  Does report more gas.  Has cut back milk.         Past Medical History  Diagnosis Date  . Hypertension   . Endometriosis   . Hypercholesterolemia   . Uterine fibroid   . Environmental allergies     Outpatient Encounter Prescriptions as of 11/21/2013  Medication Sig  . felodipine (PLENDIL) 5 MG 24 hr tablet TAKE 1 TABLET BY MOUTH EVERY DAY  . fluticasone (FLONASE) 50 MCG/ACT nasal spray Place 2 sprays into both nostrils daily.  Marland Kitchen omeprazole (PRILOSEC) 20 MG capsule TAKE 1 CAPSULE (20 MG TOTAL) BY MOUTH DAILY.  . rosuvastatin (CRESTOR) 5 MG tablet Take one tablet on Monday, Wednesday, & Friday  . triamterene-hydrochlorothiazide (MAXZIDE-25) 37.5-25 MG per tablet TAKE 1 TABLET BY MOUTH EVERY DAY  . [DISCONTINUED] amoxicillin (AMOXIL) 875 MG tablet Take 1 tablet (875 mg total) by mouth 2 (two) times daily.    Review of Systems Patient denies any headache.  No lightheadedness or dizziness.  Sinus pressure and congestion as outlined.  Thick mucus production.   Minimal drainage.  No chest pain, tightness or palpitations.  No increased shortness of breath.  No chest congestion.  No abdominal pain or cramping.  No bowel change now.  Constipation has improved.   No BRBPR or melana.  No urine change.  No significant acid reflux.  She is taking prilosec.      Objective:   Physical  Exam  Filed Vitals:   11/21/13 1347  BP: 110/70  Pulse: 91  Temp: 98.5 F (36.9 C)   Blood pressure recheck:  47/21  48 year old female in no acute distress.   HEENT:  Nares- clear with slightly erythematous turbinates.   Oropharynx - without lesions.  No significant tenderness to palpation over the sinuses.  TMs visualized without erythema.   NECK:  Supple.  Nontender.  No audible bruit.  HEART:  Appears to be regular. LUNGS:  No crackles or wheezing audible.  Respirations even and unlabored.  RADIAL PULSE:  Equal bilaterally.   ABDOMEN:  Soft, nontender.  Bowel sounds present and normal.  No audible abdominal bruit.    EXTREMITIES:  No increased edema present.  DP pulses palpable and equal bilaterally.          Assessment & Plan:  PREVIOUS NAUSEA.  Had resolved.  Now with nausea - question related to the drainage.  Treat allergies.  Increase omeprazole.  Follow.    HEALTH MAINTENANCE.  Physical 07/23/13.  Colonoscopy 7/09 - internal hemorrhoids.   Mammogram 10/31/13 - Birads I.     I spent over 25 minutes with the patient and more than 50% of the time was spent in consultation regarding the above.

## 2013-11-24 ENCOUNTER — Encounter: Payer: Self-pay | Admitting: Internal Medicine

## 2013-11-24 NOTE — Assessment & Plan Note (Signed)
Blood pressure is doing well on current regimen.  Follow.   

## 2013-11-24 NOTE — Assessment & Plan Note (Signed)
Follow cbc.  

## 2013-11-24 NOTE — Assessment & Plan Note (Signed)
Take antihistamine daily.  Saline nasal spray and steroid nasal spray as directed.  Treat reflux.  Follow.

## 2013-11-24 NOTE — Assessment & Plan Note (Signed)
Not a significant issue for her now.  Follow.   

## 2013-11-24 NOTE — Assessment & Plan Note (Signed)
Low cholesterol diet and exercise.  Follow lipid panel.   

## 2013-12-02 ENCOUNTER — Other Ambulatory Visit (INDEPENDENT_AMBULATORY_CARE_PROVIDER_SITE_OTHER): Payer: BC Managed Care – PPO

## 2013-12-02 DIAGNOSIS — I10 Essential (primary) hypertension: Secondary | ICD-10-CM

## 2013-12-02 DIAGNOSIS — D75839 Thrombocytosis, unspecified: Secondary | ICD-10-CM

## 2013-12-02 DIAGNOSIS — E7800 Pure hypercholesterolemia, unspecified: Secondary | ICD-10-CM

## 2013-12-02 DIAGNOSIS — Z1211 Encounter for screening for malignant neoplasm of colon: Secondary | ICD-10-CM

## 2013-12-02 DIAGNOSIS — D473 Essential (hemorrhagic) thrombocythemia: Secondary | ICD-10-CM

## 2013-12-02 LAB — BASIC METABOLIC PANEL
BUN: 12 mg/dL (ref 6–23)
CALCIUM: 10.1 mg/dL (ref 8.4–10.5)
CO2: 28 meq/L (ref 19–32)
CREATININE: 0.8 mg/dL (ref 0.4–1.2)
Chloride: 101 mEq/L (ref 96–112)
GFR: 85.78 mL/min (ref >60.00)
GLUCOSE: 89 mg/dL (ref 70–99)
Potassium: 3.5 mEq/L (ref 3.5–5.1)
Sodium: 139 mEq/L (ref 135–145)

## 2013-12-02 LAB — CBC WITH DIFFERENTIAL/PLATELET
Basophils Absolute: 0 10*3/uL (ref 0.0–0.1)
Basophils Relative: 0.4 % (ref 0.0–3.0)
Eosinophils Absolute: 0.1 10*3/uL (ref 0.0–0.7)
Eosinophils Relative: 1.9 % (ref 0.0–5.0)
HCT: 41.5 % (ref 36.0–46.0)
Hemoglobin: 14 g/dL (ref 12.0–15.0)
LYMPHS PCT: 26.9 % (ref 12.0–46.0)
Lymphs Abs: 1.5 10*3/uL (ref 0.7–4.0)
MCHC: 33.7 g/dL (ref 30.0–36.0)
MCV: 86.3 fl (ref 78.0–100.0)
MONOS PCT: 5.9 % (ref 3.0–12.0)
Monocytes Absolute: 0.3 10*3/uL (ref 0.1–1.0)
NEUTROS ABS: 3.6 10*3/uL (ref 1.4–7.7)
Neutrophils Relative %: 64.9 % (ref 43.0–77.0)
PLATELETS: 467 10*3/uL — AB (ref 150.0–400.0)
RBC: 4.81 Mil/uL (ref 3.87–5.11)
RDW: 14.3 % (ref 11.5–14.6)
WBC: 5.5 10*3/uL (ref 4.5–10.5)

## 2013-12-02 LAB — HEPATIC FUNCTION PANEL
ALBUMIN: 4.2 g/dL (ref 3.5–5.2)
ALK PHOS: 64 U/L (ref 39–117)
ALT: 15 U/L (ref 0–35)
AST: 15 U/L (ref 0–37)
BILIRUBIN TOTAL: 0.7 mg/dL (ref 0.3–1.2)
Bilirubin, Direct: 0 mg/dL (ref 0.0–0.3)
Total Protein: 8.4 g/dL — ABNORMAL HIGH (ref 6.0–8.3)

## 2013-12-02 LAB — LIPID PANEL
CHOLESTEROL: 271 mg/dL — AB (ref 0–200)
HDL: 78.5 mg/dL (ref >39.00)
LDL Cholesterol: 175 mg/dL — ABNORMAL HIGH (ref 0–99)
Total CHOL/HDL Ratio: 3
Triglycerides: 89 mg/dL (ref 0.0–149.0)
VLDL: 17.8 mg/dL (ref 0.0–40.0)

## 2013-12-04 ENCOUNTER — Other Ambulatory Visit: Payer: Self-pay | Admitting: *Deleted

## 2013-12-04 MED ORDER — ROSUVASTATIN CALCIUM 5 MG PO TABS: 5.0000 mg | ORAL_TABLET | Freq: Every day | ORAL | Status: DC

## 2013-12-31 ENCOUNTER — Encounter: Payer: Self-pay | Admitting: Internal Medicine

## 2013-12-31 ENCOUNTER — Ambulatory Visit (INDEPENDENT_AMBULATORY_CARE_PROVIDER_SITE_OTHER): Payer: BC Managed Care – PPO | Admitting: Internal Medicine

## 2013-12-31 VITALS — BP 110/70 | HR 91 | Temp 98.2°F | Ht 65.75 in | Wt 209.8 lb

## 2013-12-31 DIAGNOSIS — D473 Essential (hemorrhagic) thrombocythemia: Secondary | ICD-10-CM

## 2013-12-31 DIAGNOSIS — IMO0001 Reserved for inherently not codable concepts without codable children: Secondary | ICD-10-CM

## 2013-12-31 DIAGNOSIS — M79609 Pain in unspecified limb: Secondary | ICD-10-CM

## 2013-12-31 DIAGNOSIS — F39 Unspecified mood [affective] disorder: Secondary | ICD-10-CM

## 2013-12-31 DIAGNOSIS — M791 Myalgia, unspecified site: Secondary | ICD-10-CM

## 2013-12-31 DIAGNOSIS — D75839 Thrombocytosis, unspecified: Secondary | ICD-10-CM

## 2013-12-31 DIAGNOSIS — M79603 Pain in arm, unspecified: Secondary | ICD-10-CM

## 2013-12-31 DIAGNOSIS — R4586 Emotional lability: Secondary | ICD-10-CM

## 2013-12-31 DIAGNOSIS — E8809 Other disorders of plasma-protein metabolism, not elsewhere classified: Secondary | ICD-10-CM

## 2013-12-31 DIAGNOSIS — R779 Abnormality of plasma protein, unspecified: Secondary | ICD-10-CM

## 2013-12-31 DIAGNOSIS — I10 Essential (primary) hypertension: Secondary | ICD-10-CM

## 2013-12-31 DIAGNOSIS — K219 Gastro-esophageal reflux disease without esophagitis: Secondary | ICD-10-CM

## 2013-12-31 DIAGNOSIS — J329 Chronic sinusitis, unspecified: Secondary | ICD-10-CM

## 2013-12-31 MED ORDER — TRIAMTERENE-HCTZ 37.5-25 MG PO TABS: 1.0000 | ORAL_TABLET | Freq: Every day | ORAL | Status: DC

## 2013-12-31 MED ORDER — FELODIPINE ER 5 MG PO TB24
5.0000 mg | ORAL_TABLET | Freq: Every day | ORAL | Status: DC
Start: 2013-12-31 — End: 2014-07-06

## 2013-12-31 MED ORDER — OMEPRAZOLE 20 MG PO CPDR: 20.0000 mg | DELAYED_RELEASE_CAPSULE | Freq: Every day | ORAL | Status: DC

## 2013-12-31 MED ORDER — FLUTICASONE PROPIONATE 50 MCG/ACT NA SUSP: 2.0000 | Freq: Every day | NASAL | Status: DC

## 2013-12-31 NOTE — Progress Notes (Signed)
Pre visit review using our clinic review tool, if applicable. No additional management support is needed unless otherwise documented below in the visit note. 

## 2013-12-31 NOTE — Assessment & Plan Note (Addendum)
Not reported as a significant issues today.  Follow.

## 2013-12-31 NOTE — Progress Notes (Signed)
  Subjective:    Patient ID: Kathleen Gould, female    DOB: [birth year], 48 y.o.   MRN: [id number]  HPI 48 year old female with past history of uterine fibroids and endometriosis, hypertension and hypercholesterolemia who comes in today for a scheduled follow up.  She is on crestor now.  Has noticed increased pain in her right shoulder, right elbow and right hip. Feels similar to previous symptoms while on cholesterol medication.  Increased aching.  No injury.  The nausea has resolved.   No vomiting.   Breathing stable.  No abdominal pain.  Bowels stable.  No problems with constipation now.       Past Medical History  Diagnosis Date  . Hypertension   . Endometriosis   . Hypercholesterolemia   . Uterine fibroid   . Environmental allergies     Outpatient Encounter Prescriptions as of 12/31/2013  Medication Sig  . felodipine (PLENDIL) 5 MG 24 hr tablet TAKE 1 TABLET BY MOUTH EVERY DAY  . fluticasone (FLONASE) 50 MCG/ACT nasal spray Place 2 sprays into both nostrils daily.  Marland Kitchen omeprazole (PRILOSEC) 20 MG capsule TAKE 1 CAPSULE (20 MG TOTAL) BY MOUTH DAILY.  . rosuvastatin (CRESTOR) 5 MG tablet Take 1 tablet (5 mg total) by mouth daily.  Marland Kitchen triamterene-hydrochlorothiazide (MAXZIDE-25) 37.5-25 MG per tablet TAKE 1 TABLET BY MOUTH EVERY DAY    Review of Systems Patient denies any headache.  No lightheadedness or dizziness.  No sinus pressure or congestion now.   No chest pain, tightness or palpitations.  No increased shortness of breath.  No chest congestion.  No abdominal pain or cramping.  No bowel change now.  Constipation has improved.   No BRBPR or melana.  No urine change.  No significant acid reflux.  She is taking prilosec.  Nausea has resolved.  Right shoulder, elbow and hip pain and aching as outlined.       Objective:   Physical Exam  Filed Vitals:   12/31/13 1455  BP: 110/70  Pulse: 91  Temp: 98.2 F (36.8 C)   Blood pressure recheck:  25/20  48 year old female in no acute  distress.   HEENT:  Nares- clear.  Oropharynx - without lesions.    NECK:  Supple.  Nontender.  No audible bruit.  HEART:  Appears to be regular. LUNGS:  No crackles or wheezing audible.  Respirations even and unlabored.  RADIAL PULSE:  Equal bilaterally.   ABDOMEN:  Soft, nontender.  Bowel sounds present and normal.  No audible abdominal bruit.    EXTREMITIES:  No increased edema present.  DP pulses palpable and equal bilaterally.   MSK:  Some increased discomfort to palpation over the right arm.  Some increased pain with abduction right upper arm.         Assessment & Plan:  PREVIOUS NAUSEA.  Has resolved.      HEALTH MAINTENANCE.  Physical 07/23/13.  Colonoscopy 7/09 - internal hemorrhoids.   Mammogram 10/31/13 - Birads I.

## 2014-01-01 LAB — CBC WITH DIFFERENTIAL/PLATELET
Basophils Absolute: 0 10*3/uL (ref 0.0–0.1)
Basophils Relative: 0.5 % (ref 0.0–3.0)
EOS PCT: 1.3 % (ref 0.0–5.0)
Eosinophils Absolute: 0.1 10*3/uL (ref 0.0–0.7)
HEMATOCRIT: 40.1 % (ref 36.0–46.0)
HEMOGLOBIN: 13.5 g/dL (ref 12.0–15.0)
LYMPHS ABS: 2.1 10*3/uL (ref 0.7–4.0)
Lymphocytes Relative: 36.2 % (ref 12.0–46.0)
MCHC: 33.6 g/dL (ref 30.0–36.0)
MCV: 86.5 fl (ref 78.0–100.0)
Monocytes Absolute: 0.3 10*3/uL (ref 0.1–1.0)
Monocytes Relative: 5.6 % (ref 3.0–12.0)
NEUTROS ABS: 3.2 10*3/uL (ref 1.4–7.7)
Neutrophils Relative %: 56.4 % (ref 43.0–77.0)
Platelets: 443 10*3/uL — ABNORMAL HIGH (ref 150.0–400.0)
RBC: 4.64 Mil/uL (ref 3.87–5.11)
RDW: 13.8 % (ref 11.5–15.5)
WBC: 5.7 10*3/uL (ref 4.0–10.5)

## 2014-01-01 LAB — CK: CK TOTAL: 191 U/L — AB (ref 7–177)

## 2014-01-01 LAB — HEPATIC FUNCTION PANEL
ALBUMIN: 4.4 g/dL (ref 3.5–5.2)
ALT: 16 U/L (ref 0–35)
AST: 17 U/L (ref 0–37)
Alkaline Phosphatase: 59 U/L (ref 39–117)
Bilirubin, Direct: 0.1 mg/dL (ref 0.0–0.3)
TOTAL PROTEIN: 7.9 g/dL (ref 6.0–8.3)
Total Bilirubin: 0.5 mg/dL (ref 0.2–1.2)

## 2014-01-03 ENCOUNTER — Other Ambulatory Visit: Payer: Self-pay | Admitting: Internal Medicine

## 2014-01-03 DIAGNOSIS — R748 Abnormal levels of other serum enzymes: Secondary | ICD-10-CM

## 2014-01-03 NOTE — Progress Notes (Signed)
Order placed for f/u ck

## 2014-01-05 ENCOUNTER — Encounter: Payer: Self-pay | Admitting: Internal Medicine

## 2014-01-05 DIAGNOSIS — M79603 Pain in arm, unspecified: Secondary | ICD-10-CM | POA: Insufficient documentation

## 2014-01-05 NOTE — Assessment & Plan Note (Signed)
Blood pressure is doing well on current regimen.  Follow.   

## 2014-01-05 NOTE — Assessment & Plan Note (Signed)
Right shoulder and elbow pain as outlined.  Feels similar to when previously taking cholesterol medication.  Will stop the crestor.  Check ck. Call with update in 2-3 weeks.  See if symptoms resolve.  Further w/up pending response.

## 2014-01-05 NOTE — Assessment & Plan Note (Signed)
Resolved

## 2014-01-05 NOTE — Assessment & Plan Note (Signed)
On omeprazole.  Nausea resolved.

## 2014-01-05 NOTE — Assessment & Plan Note (Signed)
Follow cbc.  

## 2014-01-21 ENCOUNTER — Telehealth: Payer: Self-pay | Admitting: Internal Medicine

## 2014-01-21 NOTE — Telephone Encounter (Signed)
Pt left vm stating she has nausea and stomach pain.  Returned pt call, LMTCB.

## 2014-02-24 ENCOUNTER — Telehealth: Payer: Self-pay | Admitting: Internal Medicine

## 2014-02-24 NOTE — Telephone Encounter (Signed)
Duplicate.  See attached.   

## 2014-02-24 NOTE — Telephone Encounter (Signed)
Patient Information:  Caller Name: Braylie  Phone: [phone]  Patient: Kathleen Gould  Gender: Female  DOB: 31-Oct-1965  Age: 48 Years  PCP: Einar Pheasant  Pregnant: No  Office Follow Up:  Does the office need to follow up with this patient?: Yes  Instructions For The Office: PCP is booked today. Patient declines another provider or office.  Pleaes contact .  RN Note:  PCP is booked today. Patient declines another provider or office.  Pleaes contact .  Symptoms  Reason For Call & Symptoms: Patient reports pain to mid lower back and raidates from right flank to right mid abdomen. Onset off/on for three weeks.  She states she original thought it was UTI and was at  [facility] but it was not a UTI. Pain has continued and has worsened. +Nausea, No urinary discomfort, no vaginal discharge, afebrile. No diarrhea. Last BM yesterday normal.  Reviewed Health History In EMR: Yes  Reviewed Medications In EMR: Yes  Reviewed Allergies In EMR: Yes  Reviewed Surgeries / Procedures: Yes  Date of Onset of Symptoms: 02/03/2014  Treatments Tried: Aleve  Treatments Tried Worked: No OB / GYN:  LMP: Unknown  Guideline(s) Used:  Abdominal Pain - Female  Disposition Per Guideline:   See Today in Office  Reason For Disposition Reached:   Moderate or mild pain that comes and goes (cramps) lasts > 24 hours  Advice Given:  Rest:  Lie down and rest until you feel better.  Fluids:  Sip clear fluids only (e.g., water, flat soft drinks or 1/2 strength fruit juice) until the pain has been gone for over 2 hours. Then slowly return to a regular diet.  Diet:  Slowly advance diet from clear liquids to a bland diet  Avoid alcohol or caffeinated beverages  Avoid greasy or fatty foods.  Pass a BM:  Sit on the toilet and try to pass a bowel movement (BM). Do not strain. This may relieve the pain if it is due to constipation or impending diarrhea.  Avoid NSAIDS and Aspirin  : Avoid any drug that  can irritate the stomach lining and make the pain worse (especially aspirin and NSAIDs like ibuprofen).  Call Back If:  Abdominal pain is constant and present for more than 2 hours  You become worse.  Patient Will Follow Care Advice:  YES

## 2014-02-24 NOTE — Telephone Encounter (Signed)
Pt.notified

## 2014-02-24 NOTE — Telephone Encounter (Signed)
Please advise 

## 2014-02-24 NOTE — Telephone Encounter (Signed)
I am unable to work in today - already working through lunch.  If she is having increased pain, I agree with evaluation today to confirm nothing more acute going on.  May need CT, etc.  Will then f/u after.  Thanks.

## 2014-02-25 ENCOUNTER — Telehealth: Payer: Self-pay | Admitting: Internal Medicine

## 2014-02-25 ENCOUNTER — Encounter: Payer: Self-pay | Admitting: Internal Medicine

## 2014-02-25 ENCOUNTER — Ambulatory Visit (INDEPENDENT_AMBULATORY_CARE_PROVIDER_SITE_OTHER): Payer: BC Managed Care – PPO | Admitting: Internal Medicine

## 2014-02-25 VITALS — BP 110/70 | HR 98 | Temp 98.1°F | Ht 65.75 in | Wt 208.5 lb

## 2014-02-25 DIAGNOSIS — I10 Essential (primary) hypertension: Secondary | ICD-10-CM

## 2014-02-25 DIAGNOSIS — N390 Urinary tract infection, site not specified: Secondary | ICD-10-CM

## 2014-02-25 DIAGNOSIS — R10A1 Flank pain, right side: Secondary | ICD-10-CM

## 2014-02-25 DIAGNOSIS — K219 Gastro-esophageal reflux disease without esophagitis: Secondary | ICD-10-CM

## 2014-02-25 DIAGNOSIS — J0100 Acute maxillary sinusitis, unspecified: Secondary | ICD-10-CM

## 2014-02-25 DIAGNOSIS — R109 Unspecified abdominal pain: Secondary | ICD-10-CM

## 2014-02-25 LAB — HEPATIC FUNCTION PANEL
ALT: 16 U/L (ref 0–35)
AST: 17 U/L (ref 0–37)
Albumin: 4.5 g/dL (ref 3.5–5.2)
Alkaline Phosphatase: 76 U/L (ref 39–117)
BILIRUBIN DIRECT: 0.1 mg/dL (ref 0.0–0.3)
Total Bilirubin: 0.8 mg/dL (ref 0.2–1.2)
Total Protein: 8.8 g/dL — ABNORMAL HIGH (ref 6.0–8.3)

## 2014-02-25 LAB — CBC WITH DIFFERENTIAL/PLATELET
BASOS ABS: 0 10*3/uL (ref 0.0–0.1)
Basophils Relative: 0.3 % (ref 0.0–3.0)
EOS ABS: 0.1 10*3/uL (ref 0.0–0.7)
EOS PCT: 1.3 % (ref 0.0–5.0)
HCT: 42.9 % (ref 36.0–46.0)
Hemoglobin: 14.4 g/dL (ref 12.0–15.0)
Lymphocytes Relative: 24.8 % (ref 12.0–46.0)
Lymphs Abs: 1.6 10*3/uL (ref 0.7–4.0)
MCHC: 33.7 g/dL (ref 30.0–36.0)
MCV: 85.5 fl (ref 78.0–100.0)
Monocytes Absolute: 0.3 10*3/uL (ref 0.1–1.0)
Monocytes Relative: 5.3 % (ref 3.0–12.0)
Neutro Abs: 4.4 10*3/uL (ref 1.4–7.7)
Neutrophils Relative %: 68.3 % (ref 43.0–77.0)
PLATELETS: 519 10*3/uL — AB (ref 150.0–400.0)
RBC: 5.01 Mil/uL (ref 3.87–5.11)
RDW: 13.8 % (ref 11.5–15.5)
WBC: 6.4 10*3/uL (ref 4.0–10.5)

## 2014-02-25 LAB — POCT URINALYSIS DIPSTICK
BILIRUBIN UA: NEGATIVE
Blood, UA: NEGATIVE
Glucose, UA: NEGATIVE
Ketones, UA: NEGATIVE
Leukocytes, UA: NEGATIVE
NITRITE UA: NEGATIVE
PH UA: 7
Protein, UA: NEGATIVE
Spec Grav, UA: 1.01
UROBILINOGEN UA: 0.2

## 2014-02-25 LAB — AMYLASE: Amylase: 67 U/L (ref 27–131)

## 2014-02-25 LAB — BASIC METABOLIC PANEL
BUN: 16 mg/dL (ref 6–23)
CHLORIDE: 96 meq/L (ref 96–112)
CO2: 34 mEq/L — ABNORMAL HIGH (ref 19–32)
Calcium: 10.7 mg/dL — ABNORMAL HIGH (ref 8.4–10.5)
Creatinine, Ser: 1.4 mg/dL — ABNORMAL HIGH (ref 0.4–1.2)
GFR: 44.16 mL/min — ABNORMAL LOW (ref >60.00)
Glucose, Bld: 95 mg/dL (ref 70–99)
Potassium: 4.2 mEq/L (ref 3.5–5.1)
Sodium: 137 mEq/L (ref 135–145)

## 2014-02-25 LAB — LIPASE: LIPASE: 49 U/L (ref 11.0–59.0)

## 2014-02-25 NOTE — Telephone Encounter (Signed)
Pt notified, appt scheduled for today.

## 2014-02-25 NOTE — Patient Instructions (Signed)
Increase omeprazole to twice a day

## 2014-02-25 NOTE — Telephone Encounter (Signed)
Please call pt and see if she can come in today at 2:00.  (block the 30 minute spot).  She called yesterday and wanted to be seen.  Thanks.

## 2014-02-25 NOTE — Progress Notes (Signed)
Pre visit review using our clinic review tool, if applicable. No additional management support is needed unless otherwise documented below in the visit note. 

## 2014-02-26 ENCOUNTER — Other Ambulatory Visit: Payer: Self-pay | Admitting: Internal Medicine

## 2014-02-26 DIAGNOSIS — N289 Disorder of kidney and ureter, unspecified: Secondary | ICD-10-CM

## 2014-02-26 DIAGNOSIS — D473 Essential (hemorrhagic) thrombocythemia: Secondary | ICD-10-CM

## 2014-02-26 DIAGNOSIS — E8809 Other disorders of plasma-protein metabolism, not elsewhere classified: Secondary | ICD-10-CM

## 2014-02-26 DIAGNOSIS — R779 Abnormality of plasma protein, unspecified: Secondary | ICD-10-CM

## 2014-02-26 DIAGNOSIS — D75839 Thrombocytosis, unspecified: Secondary | ICD-10-CM

## 2014-02-26 NOTE — Progress Notes (Signed)
Orders placed for f/u labs.  

## 2014-02-27 ENCOUNTER — Telehealth: Payer: Self-pay | Admitting: *Deleted

## 2014-02-27 NOTE — Telephone Encounter (Signed)
Undated directions on Trim/HCTZ to 1/2 tablet daily (see lab results)

## 2014-03-01 ENCOUNTER — Encounter: Payer: Self-pay | Admitting: Internal Medicine

## 2014-03-01 NOTE — Assessment & Plan Note (Signed)
Saline nasal spray, mucinex and steroid nasal spray as directed.  Follow.  Hold abx.

## 2014-03-01 NOTE — Assessment & Plan Note (Addendum)
Right flank pain and abdominal pain as outlined.  Persistent intermittent worsening flares.  Question if related to a kidney stone.  Check liver panel, pancreas tests, cbc and metabolic panel.  Plan for CT abdomen to further evaluate and look for possible kidney stone.  Further w/up pending.  Hold on abx or other medications.  Of note, urine dip negative.

## 2014-03-01 NOTE — Assessment & Plan Note (Signed)
On omeprazole.  Increase to bid.  Follow.  Further w/up as outlined.

## 2014-03-01 NOTE — Assessment & Plan Note (Signed)
Blood pressure is doing well on current regimen.  Follow.

## 2014-03-01 NOTE — Progress Notes (Signed)
  Subjective:    Patient ID: Kathleen Gould, female    DOB: 08-Feb-1966, 47 y.o.   MRN: [id number]  Flank Pain  48 year old female with past history of uterine fibroids and endometriosis, hypertension and hypercholesterolemia who comes in today as a work in with concerns regarding right side/flank pain.  States has been present for the last month.  Described as constant.  Worse at times.  Some associated nausea.  Had emesis x 1 yesterday.  No vomiting otherwise.  Has been eating and drinking fine.  Some nocturia.  No hematuria.  Food does not trigger.  Describes the pain as starting right lateral flank and extending to right lower abdomen.       Past Medical History  Diagnosis Date  . Hypertension   . Endometriosis   . Hypercholesterolemia   . Uterine fibroid   . Environmental allergies     Outpatient Encounter Prescriptions as of 02/25/2014  Medication Sig  . felodipine (PLENDIL) 5 MG 24 hr tablet Take 1 tablet (5 mg total) by mouth daily.  . fluticasone (FLONASE) 50 MCG/ACT nasal spray Place 2 sprays into both nostrils daily.  Marland Kitchen omeprazole (PRILOSEC) 20 MG capsule Take 1 capsule (20 mg total) by mouth daily.  . [DISCONTINUED] triamterene-hydrochlorothiazide (MAXZIDE-25) 37.5-25 MG per tablet Take 1 tablet by mouth daily.  . [DISCONTINUED] rosuvastatin (CRESTOR) 5 MG tablet Take 1 tablet (5 mg total) by mouth daily.    Review of Systems  Genitourinary: Positive for flank pain.   No lightheadedness or dizziness.  Does report some sinus headache and congestion.  Right ear feels different.   No chest pain, tightness or palpitations.  No increased shortness of breath.  No chest congestion.  Right flank pain and lower abdominal pain as outlined.  No bowel change now.  No BRBPR or melana.  Some nocturia.  No hematuria.   No significant acid reflux.  She is taking prilosec.  Some nausea.        Objective:   Physical Exam  Filed Vitals:   02/25/14 1354  BP: 110/70  Pulse: 98  Temp:  98.1 F (36.7 C)   Blood pressure recheck:  128/76, pulse 26-21  48 year old female in no acute distress.   HEENT:  Nares- clear.  Oropharynx - without lesions.   TMs visualized without erythema.   NECK:  Supple.  Nontender.  No audible bruit.  HEART:  Appears to be regular. LUNGS:  No crackles or wheezing audible.  Respirations even and unlabored.  RADIAL PULSE:  Equal bilaterally.   ABDOMEN:  Soft.   Minimal tenderness to palpation.   Bowel sounds present and normal.  No audible abdominal bruit.    EXTREMITIES:  No increased edema present.  DP pulses palpable and equal bilaterally.        Assessment & Plan:  HEALTH MAINTENANCE.  Physical 07/23/13.  Colonoscopy 7/09 - internal hemorrhoids.   Mammogram 10/31/13 - Birads I.    I spent 25 minutes with the patient and more than 50% of the time was spent in consultation regarding the above.

## 2014-03-03 ENCOUNTER — Other Ambulatory Visit (INDEPENDENT_AMBULATORY_CARE_PROVIDER_SITE_OTHER): Payer: BC Managed Care – PPO

## 2014-03-03 DIAGNOSIS — R779 Abnormality of plasma protein, unspecified: Secondary | ICD-10-CM

## 2014-03-03 DIAGNOSIS — D75839 Thrombocytosis, unspecified: Secondary | ICD-10-CM

## 2014-03-03 DIAGNOSIS — D473 Essential (hemorrhagic) thrombocythemia: Secondary | ICD-10-CM

## 2014-03-03 DIAGNOSIS — R748 Abnormal levels of other serum enzymes: Secondary | ICD-10-CM

## 2014-03-03 DIAGNOSIS — N289 Disorder of kidney and ureter, unspecified: Secondary | ICD-10-CM

## 2014-03-03 DIAGNOSIS — E8809 Other disorders of plasma-protein metabolism, not elsewhere classified: Secondary | ICD-10-CM

## 2014-03-03 LAB — BASIC METABOLIC PANEL
BUN: 16 mg/dL (ref 6–23)
CO2: 28 mEq/L (ref 19–32)
Calcium: 10 mg/dL (ref 8.4–10.5)
Chloride: 103 mEq/L (ref 96–112)
Creatinine, Ser: 0.9 mg/dL (ref 0.4–1.2)
GFR: 71.42 mL/min (ref >60.00)
Glucose, Bld: 97 mg/dL (ref 70–99)
POTASSIUM: 3.6 meq/L (ref 3.5–5.1)
Sodium: 140 mEq/L (ref 135–145)

## 2014-03-03 LAB — PROTEIN, TOTAL: TOTAL PROTEIN: 7.4 g/dL (ref 6.0–8.3)

## 2014-03-03 LAB — CK: Total CK: 111 U/L (ref 7–177)

## 2014-03-04 LAB — PLATELET COUNT: Platelets: 279 10*3/uL (ref 150–400)

## 2014-03-05 LAB — PROTEIN ELECTROPHORESIS, SERUM
ALPHA-1-GLOBULIN: 11.1 % — AB (ref 2.9–4.9)
ALPHA-2-GLOBULIN: 9.7 % (ref 7.1–11.8)
Albumin ELP: 50.4 % — ABNORMAL LOW (ref 55.8–66.1)
BETA GLOBULIN: 6.5 % (ref 4.7–7.2)
Beta 2: 5.2 % (ref 3.2–6.5)
GAMMA GLOBULIN: 17.1 % (ref 11.1–18.8)
M-Spike, %: 0.44 g/dL
Total Protein, Serum Electrophoresis: 7.2 g/dL (ref 6.0–8.3)

## 2014-03-19 ENCOUNTER — Encounter: Payer: Self-pay | Admitting: Internal Medicine

## 2014-04-03 ENCOUNTER — Encounter: Payer: Self-pay | Admitting: Internal Medicine

## 2014-04-03 ENCOUNTER — Ambulatory Visit (INDEPENDENT_AMBULATORY_CARE_PROVIDER_SITE_OTHER): Payer: BC Managed Care – PPO | Admitting: Internal Medicine

## 2014-04-03 VITALS — BP 120/80 | HR 81 | Temp 98.6°F | Ht 65.75 in | Wt 215.5 lb

## 2014-04-03 DIAGNOSIS — R109 Unspecified abdominal pain: Secondary | ICD-10-CM

## 2014-04-03 DIAGNOSIS — I10 Essential (primary) hypertension: Secondary | ICD-10-CM

## 2014-04-03 DIAGNOSIS — D472 Monoclonal gammopathy: Secondary | ICD-10-CM

## 2014-04-03 DIAGNOSIS — Z9109 Other allergy status, other than to drugs and biological substances: Secondary | ICD-10-CM

## 2014-04-03 DIAGNOSIS — D473 Essential (hemorrhagic) thrombocythemia: Secondary | ICD-10-CM

## 2014-04-03 DIAGNOSIS — E7800 Pure hypercholesterolemia, unspecified: Secondary | ICD-10-CM

## 2014-04-03 DIAGNOSIS — K219 Gastro-esophageal reflux disease without esophagitis: Secondary | ICD-10-CM

## 2014-04-03 DIAGNOSIS — D75839 Thrombocytosis, unspecified: Secondary | ICD-10-CM

## 2014-04-03 NOTE — Progress Notes (Signed)
Pre visit review using our clinic review tool, if applicable. No additional management support is needed unless otherwise documented below in the visit note. 

## 2014-04-04 ENCOUNTER — Encounter: Payer: Self-pay | Admitting: Internal Medicine

## 2014-04-04 DIAGNOSIS — D472 Monoclonal gammopathy: Secondary | ICD-10-CM | POA: Insufficient documentation

## 2014-04-04 NOTE — Assessment & Plan Note (Signed)
Resolved.  She decided not to have CT scan.  Follow.  No abdominal pain .  Feels good.

## 2014-04-04 NOTE — Assessment & Plan Note (Signed)
Blood pressure is doing well on current regimen.  Follow.

## 2014-04-04 NOTE — Progress Notes (Signed)
  Subjective:    Patient ID: Kathleen Gould, female    DOB: Oct 28, 1965, 48 y.o.   MRN: [id number]  HPI 48 year old female with past history of uterine fibroids and endometriosis, hypertension and hypercholesterolemia who comes in today for a scheduled follow up.  She states she feels good.  No abdominal pain or flank pain.  No vomiting.  No nausea.  States symptoms resolved after last visit and she decided not to have the CT scan.   Breathing stable.  Bowels stable.  No problems with constipation now.  Some allergy symptoms still persist, but are better.  They have found a water leak and increased mold at her house.  This is being fixed.      Past Medical History  Diagnosis Date  . Hypertension   . Endometriosis   . Hypercholesterolemia   . Uterine fibroid   . Environmental allergies     Outpatient Encounter Prescriptions as of 04/03/2014  Medication Sig  . felodipine (PLENDIL) 5 MG 24 hr tablet Take 1 tablet (5 mg total) by mouth daily.  . fluticasone (FLONASE) 50 MCG/ACT nasal spray Place 2 sprays into both nostrils daily.  Marland Kitchen omeprazole (PRILOSEC) 20 MG capsule Take 1 capsule (20 mg total) by mouth daily.  Marland Kitchen triamterene-hydrochlorothiazide (MAXZIDE-25) 37.5-25 MG per tablet Take 0.5 tablets by mouth daily.    Review of Systems Patient denies any headache.  No lightheadedness or dizziness.  No sinus pressure or congestion now.   No chest pain, tightness or palpitations.  No increased shortness of breath.  No chest congestion.  No abdominal pain or cramping.  No bowel change now.  Constipation has improved.   No BRBPR or melana.  No urine change.  No acid reflux.  She is taking prilosec.  Nausea has resolved.  Overall she feels she is doing well.        Objective:   Physical Exam  Filed Vitals:   04/03/14 0858  BP: 120/80  Pulse: 81  Temp: 98.6 F (41 C)   48 year old female in no acute distress.   HEENT:  Nares- clear.  Oropharynx - without lesions.    NECK:  Supple.   Nontender.  No audible bruit.  HEART:  Appears to be regular. LUNGS:  No crackles or wheezing audible.  Respirations even and unlabored.  RADIAL PULSE:  Equal bilaterally.   ABDOMEN:  Soft, nontender.  Bowel sounds present and normal.  No audible abdominal bruit.    EXTREMITIES:  No increased edema present.  DP pulses palpable and equal bilaterally.          Assessment & Plan:  PREVIOUS NAUSEA.  Has resolved.      HEALTH MAINTENANCE.  Physical 07/23/13.  Colonoscopy 7/09 - internal hemorrhoids.   Mammogram 10/31/13 - Birads I.

## 2014-04-04 NOTE — Assessment & Plan Note (Signed)
Recently found to have elevated protein on labs.  F/u labs revealed mild M spike.  Discussed with her today.  Will refer to hematology for further evaluation and w/up.

## 2014-04-04 NOTE — Assessment & Plan Note (Signed)
Better.  Follow.  

## 2014-04-04 NOTE — Assessment & Plan Note (Signed)
Most recent check reveals platelet count to be wnl.  Follow.

## 2014-04-04 NOTE — Assessment & Plan Note (Signed)
Low cholesterol diet and exercise.  Follow lipid panel.   

## 2014-04-04 NOTE — Assessment & Plan Note (Signed)
On omeprazole.  Currently asymptomatic.

## 2014-04-10 ENCOUNTER — Encounter: Payer: Self-pay | Admitting: Internal Medicine

## 2014-04-10 MED ORDER — PANTOPRAZOLE SODIUM 40 MG PO TBEC: 40.0000 mg | DELAYED_RELEASE_TABLET | Freq: Every day | ORAL | Status: DC

## 2014-04-10 NOTE — Telephone Encounter (Signed)
rx sent in for protonix #30 with 3 refills.   

## 2014-04-21 ENCOUNTER — Ambulatory Visit: Payer: Self-pay | Admitting: Internal Medicine

## 2014-04-21 LAB — CBC CANCER CENTER
BASOS ABS: 0 x10 3/mm (ref 0.0–0.1)
Basophil %: 1 %
EOS ABS: 0 x10 3/mm (ref 0.0–0.7)
EOS PCT: 0.4 %
HCT: 41.5 % (ref 35.0–47.0)
HGB: 13.6 g/dL (ref 12.0–16.0)
LYMPHS ABS: 1.6 x10 3/mm (ref 1.0–3.6)
Lymphocyte %: 40.1 %
MCH: 28.3 pg (ref 26.0–34.0)
MCHC: 32.7 g/dL (ref 32.0–36.0)
MCV: 87 fL (ref 80–100)
MONO ABS: 0.3 x10 3/mm (ref 0.2–0.9)
Monocyte %: 7.2 %
NEUTROS ABS: 2.1 x10 3/mm (ref 1.4–6.5)
NEUTROS PCT: 51.3 %
PLATELETS: 440 x10 3/mm (ref 150–440)
RBC: 4.79 10*6/uL (ref 3.80–5.20)
RDW: 14.4 % (ref 11.5–14.5)
WBC: 4 x10 3/mm (ref 3.6–11.0)

## 2014-04-21 LAB — CREATININE, SERUM: Creatinine: 0.81 mg/dL (ref 0.60–1.30)

## 2014-04-21 LAB — PROTIME-INR
INR: 1
Prothrombin Time: 12.9 secs (ref 11.5–14.7)

## 2014-04-21 LAB — APTT: Activated PTT: 27.5 secs (ref 23.6–35.9)

## 2014-04-21 LAB — LACTATE DEHYDROGENASE: LDH: 187 U/L (ref 81–246)

## 2014-04-21 LAB — CALCIUM: CALCIUM: 9.3 mg/dL (ref 8.5–10.1)

## 2014-04-23 LAB — KAPPA/LAMBDA FREE LIGHT CHAINS (ARMC)

## 2014-04-23 LAB — PROT IMMUNOELECTROPHORES(ARMC)

## 2014-04-29 ENCOUNTER — Ambulatory Visit: Payer: Self-pay | Admitting: Internal Medicine

## 2014-05-29 ENCOUNTER — Ambulatory Visit: Payer: Self-pay | Admitting: Internal Medicine

## 2014-06-02 ENCOUNTER — Telehealth: Payer: Self-pay | Admitting: Internal Medicine

## 2014-06-02 NOTE — Telephone Encounter (Signed)
LMTCB to come for office visit at 8am on 10/7.msn

## 2014-06-04 ENCOUNTER — Ambulatory Visit (INDEPENDENT_AMBULATORY_CARE_PROVIDER_SITE_OTHER): Payer: BC Managed Care – PPO | Admitting: Internal Medicine

## 2014-06-04 VITALS — BP 124/78 | HR 78 | Temp 98.5°F | Ht 65.75 in | Wt 211.0 lb

## 2014-06-04 DIAGNOSIS — K219 Gastro-esophageal reflux disease without esophagitis: Secondary | ICD-10-CM

## 2014-06-04 DIAGNOSIS — D472 Monoclonal gammopathy: Secondary | ICD-10-CM

## 2014-06-04 DIAGNOSIS — R208 Other disturbances of skin sensation: Secondary | ICD-10-CM

## 2014-06-04 DIAGNOSIS — I10 Essential (primary) hypertension: Secondary | ICD-10-CM

## 2014-06-04 DIAGNOSIS — E7800 Pure hypercholesterolemia, unspecified: Secondary | ICD-10-CM

## 2014-06-04 DIAGNOSIS — R200 Anesthesia of skin: Secondary | ICD-10-CM

## 2014-06-04 DIAGNOSIS — M545 Low back pain: Secondary | ICD-10-CM

## 2014-06-04 LAB — LIPID PANEL
Cholesterol: 322 mg/dL — ABNORMAL HIGH (ref 0–200)
HDL: 71.8 mg/dL (ref >39.00)
LDL Cholesterol: 231 mg/dL — ABNORMAL HIGH (ref 0–99)
NonHDL: 250.2
TRIGLYCERIDES: 94 mg/dL (ref 0.0–149.0)
Total CHOL/HDL Ratio: 4
VLDL: 18.8 mg/dL (ref 0.0–40.0)

## 2014-06-04 LAB — BASIC METABOLIC PANEL
BUN: 11 mg/dL (ref 6–23)
CHLORIDE: 102 meq/L (ref 96–112)
CO2: 25 mEq/L (ref 19–32)
Calcium: 10.5 mg/dL (ref 8.4–10.5)
Creatinine, Ser: 0.8 mg/dL (ref 0.4–1.2)
GFR: 85.61 mL/min (ref >60.00)
Glucose, Bld: 90 mg/dL (ref 70–99)
Potassium: 4.1 mEq/L (ref 3.5–5.1)
Sodium: 140 mEq/L (ref 135–145)

## 2014-06-04 LAB — HEPATIC FUNCTION PANEL
ALT: 13 U/L (ref 0–35)
AST: 18 U/L (ref 0–37)
Albumin: 3.9 g/dL (ref 3.5–5.2)
Alkaline Phosphatase: 68 U/L (ref 39–117)
Bilirubin, Direct: 0.1 mg/dL (ref 0.0–0.3)
TOTAL PROTEIN: 8.4 g/dL — AB (ref 6.0–8.3)
Total Bilirubin: 0.8 mg/dL (ref 0.2–1.2)

## 2014-06-04 LAB — VITAMIN B12: Vitamin B-12: 360 pg/mL (ref 211–911)

## 2014-06-04 LAB — TSH: TSH: 1.36 u[IU]/mL (ref 0.35–4.50)

## 2014-06-04 NOTE — Progress Notes (Signed)
Pre visit review using our clinic review tool, if applicable. No additional management support is needed unless otherwise documented below in the visit note. 

## 2014-06-05 ENCOUNTER — Encounter: Payer: Self-pay | Admitting: Internal Medicine

## 2014-06-05 DIAGNOSIS — R200 Anesthesia of skin: Secondary | ICD-10-CM

## 2014-06-08 ENCOUNTER — Encounter: Payer: Self-pay | Admitting: Internal Medicine

## 2014-06-08 DIAGNOSIS — M549 Dorsalgia, unspecified: Secondary | ICD-10-CM | POA: Insufficient documentation

## 2014-06-08 NOTE — Assessment & Plan Note (Signed)
Just had xrays at hematology.  Stretches and exercise as directed.  Follow.

## 2014-06-08 NOTE — Assessment & Plan Note (Signed)
Seeing hematology.  Undergoing w/up.  Follow.

## 2014-06-08 NOTE — Assessment & Plan Note (Signed)
Symptoms and exam as outlined.  Check b12.  Wrist splints as directed.  Follow closely.  If persistent, will pursue further w/up including NCS and/or neurology referral.

## 2014-06-08 NOTE — Progress Notes (Signed)
  Subjective:    Patient ID: Kathleen Gould, female    DOB: [birth year], 48 y.o.   MRN: [id number]  Back Pain Associated symptoms include headaches.  Headache  Associated symptoms include back pain.  48 year old female with past history of uterine fibroids and endometriosis, hypertension and hypercholesterolemia who comes in today as a work in with concerns regarding some numbness in her finger/hands.  Also some low back pain.  Some fatigue.  No abdominal pain or flank pain.  No vomiting.  No nausea.  Was recently found to have elevated total protein.  M spike found.  Now seeing hematology.  Just had skeletal survey - including back xray.  States the numbness was first noticed when she would awaken.  No weakness.  No decreased in grip strength.  Eating and drinking well.      Past Medical History  Diagnosis Date  . Hypertension   . Endometriosis   . Hypercholesterolemia   . Uterine fibroid   . Environmental allergies     Outpatient Encounter Prescriptions as of 06/04/2014  Medication Sig  . felodipine (PLENDIL) 5 MG 24 hr tablet Take 1 tablet (5 mg total) by mouth daily.  . fluticasone (FLONASE) 50 MCG/ACT nasal spray Place 2 sprays into both nostrils daily.  [name] Take by mouth.  . triamterene-hydrochlorothiazide (MAXZIDE-25) 37.5-25 MG per tablet Take 0.5 tablets by mouth daily.  . [DISCONTINUED] pantoprazole (PROTONIX) 40 MG tablet Take 1 tablet (40 mg total) by mouth daily.    Review of Systems no significant headache reported to me.  No lightheadedness or dizziness.  No sinus pressure or congestion now.   No chest pain, tightness or palpitations.  No increased shortness of breath.  No chest congestion.  No abdominal pain or cramping.  No bowel change now.  Increased numbness in her fingers and hands.  Started on the left.  Now both.  No pain or numbness up her arms.  No weakness.  Low back pain as outlined.  No pain radiating down her legs.          Objective:   Physical Exam  Filed Vitals:   06/04/14 0804  BP: 124/78  Pulse: 78  Temp: 98.5 F (54.73 C)   48 year old female in no acute distress.   HEENT:  Nares- clear.  Oropharynx - without lesions.    NECK:  Supple.  Nontender.   HEART:  Appears to be regular. LUNGS:  No crackles or wheezing audible.  Respirations even and unlabored.  RADIAL PULSE:  Equal bilaterally.   ABDOMEN:  Soft, nontender.  Bowel sounds present and normal.  No audible abdominal bruit.    EXTREMITIES:  No increased edema present.  DP pulses palpable and equal bilaterally.   NEURO:  Positive phalens and negative tinel's.  Grip strength normal.   MSK:  No pain in the wrist.          Assessment & Plan:  PREVIOUS NAUSEA.  Has resolved.      HEALTH MAINTENANCE.  Physical 07/23/13.  Colonoscopy 7/09 - internal hemorrhoids.   Mammogram 10/31/13 - Birads I.

## 2014-06-08 NOTE — Assessment & Plan Note (Signed)
Blood pressure is doing well on current regimen.  Follow.

## 2014-06-15 MED ORDER — ATORVASTATIN CALCIUM 10 MG PO TABS: 10.0000 mg | ORAL_TABLET | Freq: Every day | ORAL | Status: DC

## 2014-06-15 NOTE — Telephone Encounter (Signed)
Sent in rx for lipitor (#30 with one refill).

## 2014-06-27 NOTE — Telephone Encounter (Signed)
Order placed for referral to neurology 

## 2014-06-27 NOTE — Addendum Note (Signed)
Addended by: Alisa Graff on: 06/27/2014 08:18 AM   Modules accepted: Orders

## 2014-07-04 ENCOUNTER — Other Ambulatory Visit: Payer: Self-pay | Admitting: Internal Medicine

## 2014-07-04 DIAGNOSIS — E7800 Pure hypercholesterolemia, unspecified: Secondary | ICD-10-CM

## 2014-07-04 DIAGNOSIS — M2550 Pain in unspecified joint: Secondary | ICD-10-CM

## 2014-07-04 NOTE — Progress Notes (Signed)
Orders placed for labs

## 2014-07-06 MED ORDER — FELODIPINE ER 5 MG PO TB24: 5.0000 mg | ORAL_TABLET | Freq: Every day | ORAL | Status: DC

## 2014-07-06 NOTE — Addendum Note (Signed)
Addended by: Alisa Graff on: 07/06/2014 06:05 PM   Modules accepted: Orders

## 2014-07-06 NOTE — Telephone Encounter (Signed)
Refilled felodipine #90 with one refill.

## 2014-07-07 ENCOUNTER — Other Ambulatory Visit: Payer: BC Managed Care – PPO

## 2014-07-09 ENCOUNTER — Other Ambulatory Visit (INDEPENDENT_AMBULATORY_CARE_PROVIDER_SITE_OTHER): Payer: BC Managed Care – PPO

## 2014-07-09 DIAGNOSIS — M2550 Pain in unspecified joint: Secondary | ICD-10-CM

## 2014-07-09 DIAGNOSIS — E7800 Pure hypercholesterolemia, unspecified: Secondary | ICD-10-CM

## 2014-07-09 LAB — HEPATIC FUNCTION PANEL
ALT: 14 U/L (ref 0–35)
AST: 14 U/L (ref 0–37)
Albumin: 3.7 g/dL (ref 3.5–5.2)
Alkaline Phosphatase: 57 U/L (ref 39–117)
BILIRUBIN DIRECT: 0.1 mg/dL (ref 0.0–0.3)
BILIRUBIN TOTAL: 0.8 mg/dL (ref 0.2–1.2)
Total Protein: 8.1 g/dL (ref 6.0–8.3)

## 2014-07-09 LAB — CK: Total CK: 143 U/L (ref 7–177)

## 2014-07-09 LAB — SEDIMENTATION RATE: SED RATE: 33 mm/h — AB (ref 0–22)

## 2014-07-09 LAB — C-REACTIVE PROTEIN

## 2014-07-10 LAB — ANA: Anti Nuclear Antibody(ANA): POSITIVE — AB

## 2014-07-10 LAB — ANTI-NUCLEAR AB-TITER (ANA TITER): ANA Titer 1: 1:40 {titer} — ABNORMAL HIGH

## 2014-07-10 LAB — RHEUMATOID FACTOR: Rhuematoid fact SerPl-aCnc: <10 IU/mL (ref <=14)

## 2014-07-11 ENCOUNTER — Encounter: Payer: Self-pay | Admitting: Internal Medicine

## 2014-07-14 NOTE — Telephone Encounter (Signed)
Unread mychart message mailed to patient 

## 2014-08-19 ENCOUNTER — Ambulatory Visit (INDEPENDENT_AMBULATORY_CARE_PROVIDER_SITE_OTHER): Payer: BC Managed Care – PPO | Admitting: Internal Medicine

## 2014-08-19 ENCOUNTER — Encounter: Payer: Self-pay | Admitting: Internal Medicine

## 2014-08-19 VITALS — BP 120/80 | HR 92 | Temp 98.3°F | Ht 66.0 in | Wt 210.5 lb

## 2014-08-19 DIAGNOSIS — K219 Gastro-esophageal reflux disease without esophagitis: Secondary | ICD-10-CM

## 2014-08-19 DIAGNOSIS — I10 Essential (primary) hypertension: Secondary | ICD-10-CM

## 2014-08-19 DIAGNOSIS — Z1211 Encounter for screening for malignant neoplasm of colon: Secondary | ICD-10-CM

## 2014-08-19 DIAGNOSIS — E7800 Pure hypercholesterolemia, unspecified: Secondary | ICD-10-CM

## 2014-08-19 DIAGNOSIS — D649 Anemia, unspecified: Secondary | ICD-10-CM

## 2014-08-19 DIAGNOSIS — D472 Monoclonal gammopathy: Secondary | ICD-10-CM

## 2014-08-19 DIAGNOSIS — R200 Anesthesia of skin: Secondary | ICD-10-CM

## 2014-08-19 DIAGNOSIS — Z1239 Encounter for other screening for malignant neoplasm of breast: Secondary | ICD-10-CM

## 2014-08-19 DIAGNOSIS — R208 Other disturbances of skin sensation: Secondary | ICD-10-CM

## 2014-08-19 DIAGNOSIS — D75839 Thrombocytosis, unspecified: Secondary | ICD-10-CM

## 2014-08-19 DIAGNOSIS — J0100 Acute maxillary sinusitis, unspecified: Secondary | ICD-10-CM

## 2014-08-19 DIAGNOSIS — D473 Essential (hemorrhagic) thrombocythemia: Secondary | ICD-10-CM

## 2014-08-19 LAB — HEPATIC FUNCTION PANEL
ALK PHOS: 58 U/L (ref 39–117)
ALT: 14 U/L (ref 0–35)
AST: 14 U/L (ref 0–37)
Albumin: 4.3 g/dL (ref 3.5–5.2)
BILIRUBIN DIRECT: 0 mg/dL (ref 0.0–0.3)
TOTAL PROTEIN: 8 g/dL (ref 6.0–8.3)
Total Bilirubin: 0.7 mg/dL (ref 0.2–1.2)

## 2014-08-19 NOTE — Progress Notes (Signed)
Subjective:    Patient ID: Kathleen Gould, female    DOB: July 08, 1966, 48 y.o.   MRN: [id number]  HPI 48 year old female with past history of uterine fibroids and endometriosis, hypertension and hypercholesterolemia who comes in today to follow up on these issues as well as for a complete physical exam.  Recently evaluated for hand and foot numbness.  Saw Dr Manuella Ghazi for NCS.  NCS c/w bilateral mild (grade II) carpal tunnel syndrome.  She is using splints.  Have helped.   Was seen at [facility].  Diagnosed with sinusitis.  Was treated with augmentin.  Feels better.  Breathing stable.  No chest pain or tightness.  No sob.  Eating and drinking well.  No abdominal pain or cramping.  Bowels stable.  Due to f/u with Dr Ma Hillock 2-10/2014.  Tolerating lipitor.      Past Medical History  Diagnosis Date  . Hypertension   . Endometriosis   . Hypercholesterolemia   . Uterine fibroid   . Environmental allergies     Outpatient Encounter Prescriptions as of 08/19/2014  Medication Sig  . Amoxicillin-Pot Clavulanate (AUGMENTIN PO) Take 875 mg by mouth 2 (two) times daily.  Marland Kitchen atorvastatin (LIPITOR) 10 MG tablet Take 1 tablet (10 mg total) by mouth daily.  . felodipine (PLENDIL) 5 MG 24 hr tablet Take 1 tablet (5 mg total) by mouth daily.  . fluticasone (FLONASE) 50 MCG/ACT nasal spray Place 2 sprays into both nostrils daily.  [name] Take by mouth.  . triamterene-hydrochlorothiazide (MAXZIDE-25) 37.5-25 MG per tablet Take 0.5 tablets by mouth daily.    Review of Systems Patient denies any headache.  No lightheadedness or dizziness.  Sinus congestion has improved.  Taking augmentin.  No chest pain, tightness or palpitations.  No increased shortness of breath.  No chest congestion.  No abdominal pain or cramping.  No bowel change.  No BRBPR or melana.  No urine change.  No acid reflux.  She is taking prilosec.  Nausea has resolved.  Overall she feels she is  doing well.   Splints have helped her numbness.  Has f/u with Dr Ma Hillock as outlined.  Feels she is doing well with increased stress.  Overall feels better.       Objective:   Physical Exam  Filed Vitals:   08/19/14 0825  BP: 120/80  Pulse: 92  Temp: 98.3 F (74.56 C)   48 year old female in no acute distress.   HEENT:  Nares- clear.  Oropharynx - without lesions. NECK:  Supple.  Nontender.  No audible bruit.  HEART:  Appears to be regular. LUNGS:  No crackles or wheezing audible.  Respirations even and unlabored.  RADIAL PULSE:  Equal bilaterally.    BREASTS:  No nipple discharge or nipple retraction present.  Could not appreciate any distinct nodules or axillary adenopathy.  ABDOMEN:  Soft, nontender.  Bowel sounds present and normal.  No audible abdominal bruit.  GU:  Not performed.     EXTREMITIES:  No increased edema present.  DP pulses palpable and equal bilaterally.            Assessment & Plan:  1. Hypercholesterolemia Cholesterol elevated on last check 06/04/14.  Started on lipitor.  Tolerating. - Hepatic function panel Lab Results  Component Value Date   CHOL 322* 06/04/2014   HDL 71.80 06/04/2014   LDLCALC 231* 06/04/2014   LDLDIRECT 164.5 07/24/2013   TRIG 94.0 06/04/2014   CHOLHDL 4  06/04/2014   2. Breast cancer screening - MM DIGITAL SCREENING BILATERAL; Future  3. Colon cancer screening Colonoscopy in 2009.   - Fecal occult blood, imunochemical; Future  4. Essential hypertension, benign Blood pressure doing well.  Same medication regimen.  Follow pressures.  Follow met b.   5. Acute maxillary sinusitis, recurrence not specified Recently evaluated and treated with augmentin.  Better.    6. Gastroesophageal reflux disease, esophagitis presence not specified Controlled on prilosec.  Follow.    7. Thrombocytosis Platelet count 04/21/14 - 440 (wnl).  Follow.    8. Anemia, unspecified anemia type Hgb 13.6 - 04/21/14.    9. Pure  hypercholesterolemia Increased cholesterol as outlined.  Started on lipitor.    10. MGUS (monoclonal gammopathy of unknown significance) Stable.  Followed by Dr Ma Hillock.    11. Bilateral hand numbness NCS as outlined.  Splints helping.  Follow.    12. PREVIOUS NAUSEA.  Has resolved.      HEALTH MAINTENANCE.  Physical today.  Colonoscopy 7/09 - internal hemorrhoids.   Mammogram 10/31/13 - [facility] I.   Schedule f/u mammogram when due.

## 2014-08-19 NOTE — Progress Notes (Signed)
Pre visit review using our clinic review tool, if applicable. No additional management support is needed unless otherwise documented below in the visit note. 

## 2014-08-21 ENCOUNTER — Encounter: Payer: Self-pay | Admitting: *Deleted

## 2014-08-25 ENCOUNTER — Encounter: Payer: Self-pay | Admitting: Internal Medicine

## 2014-08-26 NOTE — Telephone Encounter (Signed)
Called patient on both numbers listed in her chart No answer. Left detailed message on home voicemail requesting a call back as soon as possible.

## 2014-08-27 NOTE — Telephone Encounter (Signed)
Pt went to [facility] [place] yesterday & was given a cough syrup for a URI. Will call back next week, if no better.

## 2014-08-27 NOTE — Telephone Encounter (Signed)
Noted  

## 2014-10-23 ENCOUNTER — Telehealth: Payer: Self-pay | Admitting: *Deleted

## 2014-10-23 DIAGNOSIS — E7800 Pure hypercholesterolemia, unspecified: Secondary | ICD-10-CM

## 2014-10-23 DIAGNOSIS — I10 Essential (primary) hypertension: Secondary | ICD-10-CM

## 2014-10-23 NOTE — Telephone Encounter (Signed)
Pt coming in tomorrow what labs and dx?  

## 2014-10-24 ENCOUNTER — Other Ambulatory Visit (INDEPENDENT_AMBULATORY_CARE_PROVIDER_SITE_OTHER): Payer: BC Managed Care – PPO

## 2014-10-24 DIAGNOSIS — E7800 Pure hypercholesterolemia, unspecified: Secondary | ICD-10-CM

## 2014-10-24 DIAGNOSIS — I10 Essential (primary) hypertension: Secondary | ICD-10-CM

## 2014-10-24 LAB — HEPATIC FUNCTION PANEL
ALBUMIN: 4.5 g/dL (ref 3.5–5.2)
ALT: 14 U/L (ref 0–35)
AST: 14 U/L (ref 0–37)
Alkaline Phosphatase: 74 U/L (ref 39–117)
BILIRUBIN TOTAL: 0.7 mg/dL (ref 0.2–1.2)
Bilirubin, Direct: 0.1 mg/dL (ref 0.0–0.3)
Total Protein: 8.2 g/dL (ref 6.0–8.3)

## 2014-10-24 LAB — LIPID PANEL
CHOL/HDL RATIO: 3
CHOLESTEROL: 237 mg/dL — AB (ref 0–200)
HDL: 75.8 mg/dL (ref >39.00)
LDL CALC: 146 mg/dL — AB (ref 0–99)
NonHDL: 161.2
TRIGLYCERIDES: 75 mg/dL (ref 0.0–149.0)
VLDL: 15 mg/dL (ref 0.0–40.0)

## 2014-10-24 LAB — BASIC METABOLIC PANEL
BUN: 11 mg/dL (ref 6–23)
CALCIUM: 10.2 mg/dL (ref 8.4–10.5)
CO2: 29 mEq/L (ref 19–32)
Chloride: 102 mEq/L (ref 96–112)
Creatinine, Ser: 0.74 mg/dL (ref 0.40–1.20)
GFR: 106.65 mL/min (ref >60.00)
GLUCOSE: 102 mg/dL — AB (ref 70–99)
Potassium: 3.4 mEq/L — ABNORMAL LOW (ref 3.5–5.1)
SODIUM: 138 meq/L (ref 135–145)

## 2014-10-24 LAB — CBC AND DIFFERENTIAL
HEMATOCRIT: 39 % (ref 36–46)
Hemoglobin: 13 g/dL (ref 12.0–16.0)
Platelets: 414 10*3/uL — AB (ref 150–399)
WBC: 5.1 10^3/mL

## 2014-10-24 NOTE — Telephone Encounter (Signed)
Order placed for labs.

## 2014-10-27 ENCOUNTER — Encounter: Payer: Self-pay | Admitting: Internal Medicine

## 2014-10-28 ENCOUNTER — Other Ambulatory Visit (INDEPENDENT_AMBULATORY_CARE_PROVIDER_SITE_OTHER): Payer: BC Managed Care – PPO

## 2014-10-28 ENCOUNTER — Encounter: Payer: Self-pay | Admitting: Internal Medicine

## 2014-10-28 ENCOUNTER — Ambulatory Visit (INDEPENDENT_AMBULATORY_CARE_PROVIDER_SITE_OTHER): Payer: BC Managed Care – PPO | Admitting: Internal Medicine

## 2014-10-28 VITALS — BP 124/80 | HR 82 | Temp 98.1°F | Ht 66.0 in | Wt 217.4 lb

## 2014-10-28 DIAGNOSIS — K219 Gastro-esophageal reflux disease without esophagitis: Secondary | ICD-10-CM

## 2014-10-28 DIAGNOSIS — Z9109 Other allergy status, other than to drugs and biological substances: Secondary | ICD-10-CM

## 2014-10-28 DIAGNOSIS — E7800 Pure hypercholesterolemia, unspecified: Secondary | ICD-10-CM

## 2014-10-28 DIAGNOSIS — D473 Essential (hemorrhagic) thrombocythemia: Secondary | ICD-10-CM

## 2014-10-28 DIAGNOSIS — I10 Essential (primary) hypertension: Secondary | ICD-10-CM

## 2014-10-28 DIAGNOSIS — Z0000 Encounter for general adult medical examination without abnormal findings: Secondary | ICD-10-CM

## 2014-10-28 DIAGNOSIS — D472 Monoclonal gammopathy: Secondary | ICD-10-CM

## 2014-10-28 DIAGNOSIS — D75839 Thrombocytosis, unspecified: Secondary | ICD-10-CM

## 2014-10-28 DIAGNOSIS — Z91048 Other nonmedicinal substance allergy status: Secondary | ICD-10-CM

## 2014-10-28 DIAGNOSIS — Z1211 Encounter for screening for malignant neoplasm of colon: Secondary | ICD-10-CM

## 2014-10-28 LAB — FECAL OCCULT BLOOD, IMMUNOCHEMICAL: FECAL OCCULT BLD: NEGATIVE

## 2014-10-28 MED ORDER — FLUTICASONE PROPIONATE 50 MCG/ACT NA SUSP: 2.0000 | Freq: Every day | NASAL | Status: DC

## 2014-10-28 MED ORDER — ATORVASTATIN CALCIUM 10 MG PO TABS: 10.0000 mg | ORAL_TABLET | Freq: Every day | ORAL | Status: DC

## 2014-10-28 MED ORDER — FELODIPINE ER 5 MG PO TB24: 5.0000 mg | ORAL_TABLET | Freq: Every day | ORAL | Status: DC

## 2014-10-28 MED ORDER — TRIAMTERENE-HCTZ 37.5-25 MG PO TABS: 0.5000 | ORAL_TABLET | Freq: Every day | ORAL | Status: DC

## 2014-10-28 NOTE — Progress Notes (Signed)
Patient ID: Kathleen Gould, female   DOB: 1966-08-02, 49 y.o.   MRN: [id number]   Subjective:    Patient ID: Kathleen Gould, female    DOB: 16-May-1966, 49 y.o.   MRN: [id number]  HPI  Patient here for a scheduled follow up.  Has a history of hypertension and hypercholesterolemia.  She is still having issues with some nasal congestion.  Taking zyrtec and using saline nasal spray.  Some days better than others.  Desires no further w/up at this point.  Discussed referral to ENT.  She declines at this time.  She is scheduled to see Dr Ma Hillock tomorrow.  She is taking lipitor.  Tolerating.  Cholesterol is better.  She wants to hold on increasing the dose.  Wants to work on diet and exercise.  Scheduled to have her mammogram next week.     Past Medical History  Diagnosis Date  . Hypertension   . Endometriosis   . Hypercholesterolemia   . Uterine fibroid   . Environmental allergies     Current Outpatient Prescriptions on File Prior to Visit  Medication Sig Dispense Refill  . Omeprazole-Sodium Bicarbonate (ZEGERID OTC PO) Take by mouth.     No current facility-administered medications on file prior to visit.    Review of Systems  Constitutional: Negative for appetite change and unexpected weight change.  HENT: Positive for congestion (nasal congestion.  ) and postnasal drip. Negative for sinus pressure and sore throat.   Eyes: Negative for pain and visual disturbance.  Respiratory: Negative for cough, chest tightness and shortness of breath.   Cardiovascular: Negative for chest pain, palpitations and leg swelling.  Gastrointestinal: Negative for abdominal pain, diarrhea and constipation.  Genitourinary: Negative for frequency and difficulty urinating.  Musculoskeletal: Negative for back pain and joint swelling.  Skin: Negative for color change and rash.  Neurological: Negative for dizziness, light-headedness and headaches.  Hematological: Negative for adenopathy. Does not  bruise/bleed easily.  Psychiatric/Behavioral: Negative for behavioral problems and dysphoric mood.       Objective:     Blood pressure recheck 132/78  Physical Exam  Constitutional: She is oriented to person, place, and time. She appears well-developed and well-nourished. No distress.  HENT:  Nose: Nose normal.  Mouth/Throat: Oropharynx is clear and moist.  Neck: Neck supple. No thyromegaly present.  Cardiovascular: Normal rate and regular rhythm.   Pulmonary/Chest: Breath sounds normal. No respiratory distress. She has no wheezes.  Abdominal: Soft. Bowel sounds are normal. There is no tenderness.  Musculoskeletal: She exhibits no edema or tenderness.  Lymphadenopathy:    She has no cervical adenopathy.  Neurological: She is alert and oriented to person, place, and time.  Skin: No rash noted. No erythema.  Psychiatric: She has a normal mood and affect. Her behavior is normal.    BP 124/80 mmHg  Pulse 82  Temp(Src) 98.1 F (36.7 C) (Oral)  Ht 5\' 6"  (1.676 m)  Wt 217 lb 6 oz (98.601 kg)  BMI 35.10 kg/m2  SpO2 97%  LMP 11/06/2000 Wt Readings from Last 3 Encounters:  10/28/14 217 lb 6 oz (98.601 kg)  08/19/14 210 lb 8 oz (95.482 kg)  06/04/14 211 lb (95.709 kg)     Lab Results  Component Value Date   WBC 6.4 02/25/2014   HGB 14.4 02/25/2014   HCT 42.9 02/25/2014   PLT 279 03/03/2014   GLUCOSE 102* 10/24/2014   CHOL 237* 10/24/2014   TRIG 75.0 10/24/2014   HDL 75.80 10/24/2014   LDLDIRECT  164.5 07/24/2013   LDLCALC 146* 10/24/2014   ALT 14 10/24/2014   AST 14 10/24/2014   NA 138 10/24/2014   K 3.4* 10/24/2014   CL 102 10/24/2014   CREATININE 0.74 10/24/2014   BUN 11 10/24/2014   CO2 29 10/24/2014   TSH 1.36 06/04/2014       Assessment & Plan:   Problem List Items Addressed This Visit    Environmental allergies    Persistent symptoms as outlined.  Discussed referral to ENT.  She wants to hold at this time.  Follow.        Esophageal reflux    On  omeprazole.  Controlled.        Essential hypertension, benign - Primary    Blood pressure as outlined.  Same medication regimen.  Follow pressures and follow metabolic panel.        Relevant Medications   atorvastatin (LIPITOR) tablet   felodipine (PLENDIL) 24 hr tablet   triamterene-hydrochlorothiazide (MAXZIDE-25) 37.5-25 MG per tablet   Other Relevant Orders   Basic metabolic panel   Health care maintenance    Physical 08/19/14.  Colonoscopy 02/2008.  Mammogram 10/31/13 - [facility]ads I.  Scheduled for f/u mammogram next week (11/04/14).        MGUS (monoclonal gammopathy of unknown significance)    Seeing Dr Ma Hillock.  Has f/u planned for tomorrow.        Pure hypercholesterolemia    On lipitor.  Cholesterol improved.  LDL 146.  Discussed increasing the dose of her cholesterol medication.  She wants to continue to work on diet and exercise.  Wants to hold on changing the dose.  Follow.        Relevant Medications   atorvastatin (LIPITOR) tablet   felodipine (PLENDIL) 24 hr tablet   triamterene-hydrochlorothiazide (MAXZIDE-25) 37.5-25 MG per tablet   Other Relevant Orders   Hepatic function panel   Lipid panel   Thrombocytosis    CBC's being followed through hematology.  Sees Dr Ma Hillock.           Einar Pheasant, MD

## 2014-10-28 NOTE — Progress Notes (Signed)
Pre visit review using our clinic review tool, if applicable. No additional management support is needed unless otherwise documented below in the visit note. 

## 2014-10-29 ENCOUNTER — Ambulatory Visit
Admit: 2014-10-29 | Disposition: A | Discharge status: Home / Self Care | Payer: Self-pay | Attending: Internal Medicine | Admitting: Internal Medicine

## 2014-10-29 ENCOUNTER — Encounter: Payer: Self-pay | Admitting: Internal Medicine

## 2014-10-29 DIAGNOSIS — Z0000 Encounter for general adult medical examination without abnormal findings: Secondary | ICD-10-CM | POA: Insufficient documentation

## 2014-10-29 NOTE — Assessment & Plan Note (Signed)
Seeing Dr Ma Hillock.  Has f/u planned for tomorrow.

## 2014-10-29 NOTE — Assessment & Plan Note (Signed)
On lipitor.  Cholesterol improved.  LDL 146.  Discussed increasing the dose of her cholesterol medication.  She wants to continue to work on diet and exercise.  Wants to hold on changing the dose.  Follow.

## 2014-10-29 NOTE — Assessment & Plan Note (Signed)
Persistent symptoms as outlined.  Discussed referral to ENT.  She wants to hold at this time.  Follow.

## 2014-10-29 NOTE — Assessment & Plan Note (Signed)
CBC's being followed through hematology.  Sees Dr Ma Hillock.

## 2014-10-29 NOTE — Assessment & Plan Note (Signed)
On omeprazole.  Controlled.   

## 2014-10-29 NOTE — Assessment & Plan Note (Signed)
Blood pressure as outlined.  Same medication regimen.  Follow pressures and follow metabolic panel.

## 2014-10-29 NOTE — Assessment & Plan Note (Signed)
Physical 08/19/14.  Colonoscopy 02/2008.  Mammogram 10/31/13 - birads I.  Scheduled for f/u mammogram next week (11/04/14).

## 2014-10-30 NOTE — Telephone Encounter (Signed)
Unread mychart message mailed to patient 

## 2014-10-31 ENCOUNTER — Encounter: Payer: Self-pay | Admitting: Internal Medicine

## 2014-11-04 ENCOUNTER — Ambulatory Visit: Payer: Self-pay | Admitting: Internal Medicine

## 2014-11-04 LAB — HM MAMMOGRAPHY: HM MAMMO: NEGATIVE

## 2014-11-28 ENCOUNTER — Ambulatory Visit
Admit: 2014-11-28 | Disposition: A | Discharge status: Home / Self Care | Payer: Self-pay | Attending: Internal Medicine | Admitting: Internal Medicine

## 2014-12-02 ENCOUNTER — Encounter: Payer: Self-pay | Admitting: Internal Medicine

## 2014-12-05 ENCOUNTER — Encounter: Payer: Self-pay | Admitting: Internal Medicine

## 2014-12-05 ENCOUNTER — Ambulatory Visit (INDEPENDENT_AMBULATORY_CARE_PROVIDER_SITE_OTHER): Payer: BC Managed Care – PPO | Admitting: Internal Medicine

## 2014-12-05 VITALS — HR 87 | Temp 97.7°F | Ht 66.0 in | Wt 217.4 lb

## 2014-12-05 DIAGNOSIS — I10 Essential (primary) hypertension: Secondary | ICD-10-CM

## 2014-12-05 DIAGNOSIS — M542 Cervicalgia: Secondary | ICD-10-CM | POA: Diagnosis not present

## 2014-12-05 NOTE — Progress Notes (Signed)
Pre visit review using our clinic review tool, if applicable. No additional management support is needed unless otherwise documented below in the visit note. 

## 2014-12-09 ENCOUNTER — Other Ambulatory Visit: Payer: Self-pay | Admitting: Internal Medicine

## 2014-12-11 ENCOUNTER — Encounter: Payer: Self-pay | Admitting: Internal Medicine

## 2014-12-12 ENCOUNTER — Ambulatory Visit
Admit: 2014-12-12 | Disposition: A | Discharge status: Home / Self Care | Payer: Self-pay | Attending: Internal Medicine | Admitting: Internal Medicine

## 2014-12-14 ENCOUNTER — Encounter: Payer: Self-pay | Admitting: Internal Medicine

## 2014-12-14 DIAGNOSIS — M542 Cervicalgia: Secondary | ICD-10-CM | POA: Insufficient documentation

## 2014-12-14 NOTE — Assessment & Plan Note (Signed)
Increased pain with rotation of her head, etc.  Baclofen seems to have helped.  Better today.  Discussed xray if continued.  Wants to hold for now.  Continue baclofen over the weekend.  Ok to take tylenol as well.  Call with update.

## 2014-12-14 NOTE — Progress Notes (Signed)
Patient ID: Kathleen Gould, female   DOB: [birth year], 49 y.o.   MRN: [id number]   Subjective:    Patient ID: Kathleen Gould, female    DOB: [birth year], 49 y.o.   MRN: [id number]  HPI  Patient here as a work in with concerns regarding neck and shoulder pain.  States pain started 11/21/14.  Involved the neck and shoulder.  Was taking tylenol and alleve.  Turn head - worse.  Went to acute care.  Was given baclofen and prednisone.  Only took the baclofen.  Better initially.  Intermittently flares.  Worse with coughing and turning head.  No nausea or vomiting.  No headache.  No injury or trauma.  Better today.    Past Medical History  Diagnosis Date  . Hypertension   . Endometriosis   . Hypercholesterolemia   . Uterine fibroid   . Environmental allergies     Current Outpatient Prescriptions on File Prior to Visit  Medication Sig Dispense Refill  . atorvastatin (LIPITOR) 10 MG tablet Take 1 tablet (10 mg total) by mouth daily. 30 tablet 5  . felodipine (PLENDIL) 5 MG 24 hr tablet Take 1 tablet (5 mg total) by mouth daily. 30 tablet 5  . fluticasone (FLONASE) 50 MCG/ACT nasal spray Place 2 sprays into both nostrils daily. 16 g 5  . Omeprazole-Sodium Bicarbonate (ZEGERID OTC PO) Take by mouth.    . triamterene-hydrochlorothiazide (MAXZIDE-25) 37.5-25 MG per tablet Take 0.5 tablets by mouth daily. 30 tablet 3   No current facility-administered medications on file prior to visit.    Review of Systems  Constitutional: Negative for fever and chills.  HENT: Negative for congestion and sinus pressure.   Respiratory: Negative for chest tightness and shortness of breath.   Cardiovascular: Negative for chest pain and leg swelling.  Gastrointestinal: Negative for nausea and vomiting.  Musculoskeletal: Positive for neck pain. Negative for back pain.  Neurological: Negative for dizziness, light-headedness and headaches.       Objective:    Physical Exam  HENT:  Nose: Nose normal.    Mouth/Throat: Oropharynx is clear and moist.  Neck: Neck supple. No thyromegaly present.  Cardiovascular: Normal rate and regular rhythm.   Pulmonary/Chest: Breath sounds normal. No respiratory distress. She has no wheezes.  Abdominal: Soft. Bowel sounds are normal.  Musculoskeletal:  Some minimal discomfort with rotation of her head - looking from right to left.    Lymphadenopathy:    She has no cervical adenopathy.    Pulse 87  Temp(Src) 97.7 F (36.5 C) (Oral)  Ht 5\' 6"  (1.676 m)  Wt 217 lb 6 oz (98.601 kg)  BMI 35.10 kg/m2  SpO2 97%  LMP 11/06/2000 Wt Readings from Last 3 Encounters:  12/05/14 217 lb 6 oz (98.601 kg)  10/28/14 217 lb 6 oz (98.601 kg)  08/19/14 210 lb 8 oz (95.482 kg)     Lab Results  Component Value Date   WBC 5.1 10/24/2014   HGB 13.0 10/24/2014   HCT 39 10/24/2014   PLT 414* 10/24/2014   GLUCOSE 102* 10/24/2014   CHOL 237* 10/24/2014   TRIG 75.0 10/24/2014   HDL 75.80 10/24/2014   LDLDIRECT 164.5 07/24/2013   LDLCALC 146* 10/24/2014   ALT 14 10/24/2014   AST 14 10/24/2014   NA 138 10/24/2014   K 3.4* 10/24/2014   CL 102 10/24/2014   CREATININE 0.74 10/24/2014   BUN 11 10/24/2014   CO2 29 10/24/2014   TSH 1.36 06/04/2014  Assessment & Plan:   Problem List Items Addressed This Visit    Essential hypertension, benign    Has been controlled on current regimen.  Follow.       Neck pain - Primary    Increased pain with rotation of her head, etc.  Baclofen seems to have helped.  Better today.  Discussed xray if continued.  Wants to hold for now.  Continue baclofen over the weekend.  Ok to take tylenol as well.  Call with update.             Einar Pheasant, MD

## 2014-12-14 NOTE — Assessment & Plan Note (Signed)
Has been controlled on current regimen.  Follow.

## 2014-12-15 ENCOUNTER — Encounter: Payer: Self-pay | Admitting: Internal Medicine

## 2014-12-15 DIAGNOSIS — H9209 Otalgia, unspecified ear: Secondary | ICD-10-CM

## 2014-12-15 DIAGNOSIS — J029 Acute pharyngitis, unspecified: Secondary | ICD-10-CM

## 2014-12-16 NOTE — Telephone Encounter (Signed)
Order placed for ENT referral.   

## 2014-12-28 ENCOUNTER — Other Ambulatory Visit: Payer: Self-pay | Admitting: Internal Medicine

## 2015-01-08 ENCOUNTER — Encounter: Payer: Self-pay | Admitting: Internal Medicine

## 2015-02-25 ENCOUNTER — Other Ambulatory Visit (INDEPENDENT_AMBULATORY_CARE_PROVIDER_SITE_OTHER): Payer: BC Managed Care – PPO

## 2015-02-25 ENCOUNTER — Encounter: Payer: Self-pay | Admitting: Internal Medicine

## 2015-02-25 DIAGNOSIS — I10 Essential (primary) hypertension: Secondary | ICD-10-CM

## 2015-02-25 DIAGNOSIS — E7800 Pure hypercholesterolemia, unspecified: Secondary | ICD-10-CM

## 2015-02-25 LAB — LIPID PANEL
Cholesterol: 232 mg/dL — ABNORMAL HIGH (ref 0–200)
HDL: 68.9 mg/dL (ref >39.00)
LDL Cholesterol: 151 mg/dL — ABNORMAL HIGH (ref 0–99)
NONHDL: 163.1
Total CHOL/HDL Ratio: 3
Triglycerides: 62 mg/dL (ref 0.0–149.0)
VLDL: 12.4 mg/dL (ref 0.0–40.0)

## 2015-02-25 LAB — BASIC METABOLIC PANEL
BUN: 12 mg/dL (ref 6–23)
CALCIUM: 10 mg/dL (ref 8.4–10.5)
CO2: 31 mEq/L (ref 19–32)
CREATININE: 0.75 mg/dL (ref 0.40–1.20)
Chloride: 102 mEq/L (ref 96–112)
GFR: 104.87 mL/min (ref >60.00)
GLUCOSE: 102 mg/dL — AB (ref 70–99)
Potassium: 3.9 mEq/L (ref 3.5–5.1)
Sodium: 140 mEq/L (ref 135–145)

## 2015-02-25 LAB — HEPATIC FUNCTION PANEL
ALBUMIN: 4.4 g/dL (ref 3.5–5.2)
ALT: 16 U/L (ref 0–35)
AST: 17 U/L (ref 0–37)
Alkaline Phosphatase: 73 U/L (ref 39–117)
Bilirubin, Direct: 0 mg/dL (ref 0.0–0.3)
Total Bilirubin: 0.5 mg/dL (ref 0.2–1.2)
Total Protein: 8 g/dL (ref 6.0–8.3)

## 2015-02-27 ENCOUNTER — Ambulatory Visit (INDEPENDENT_AMBULATORY_CARE_PROVIDER_SITE_OTHER): Payer: BC Managed Care – PPO | Admitting: Internal Medicine

## 2015-02-27 ENCOUNTER — Encounter: Payer: Self-pay | Admitting: Internal Medicine

## 2015-02-27 VITALS — BP 121/68 | HR 88 | Temp 98.5°F | Resp 18 | Ht 66.0 in | Wt 220.4 lb

## 2015-02-27 DIAGNOSIS — I10 Essential (primary) hypertension: Secondary | ICD-10-CM

## 2015-02-27 DIAGNOSIS — D472 Monoclonal gammopathy: Secondary | ICD-10-CM

## 2015-02-27 DIAGNOSIS — E7800 Pure hypercholesterolemia, unspecified: Secondary | ICD-10-CM

## 2015-02-27 DIAGNOSIS — D473 Essential (hemorrhagic) thrombocythemia: Secondary | ICD-10-CM | POA: Diagnosis not present

## 2015-02-27 DIAGNOSIS — Z0000 Encounter for general adult medical examination without abnormal findings: Secondary | ICD-10-CM

## 2015-02-27 DIAGNOSIS — K219 Gastro-esophageal reflux disease without esophagitis: Secondary | ICD-10-CM | POA: Diagnosis not present

## 2015-02-27 DIAGNOSIS — Z9109 Other allergy status, other than to drugs and biological substances: Secondary | ICD-10-CM

## 2015-02-27 DIAGNOSIS — Z91048 Other nonmedicinal substance allergy status: Secondary | ICD-10-CM

## 2015-02-27 DIAGNOSIS — D649 Anemia, unspecified: Secondary | ICD-10-CM

## 2015-02-27 DIAGNOSIS — D75839 Thrombocytosis, unspecified: Secondary | ICD-10-CM

## 2015-02-27 NOTE — Progress Notes (Signed)
Pre visit review using our clinic review tool, if applicable. No additional management support is needed unless otherwise documented below in the visit note. 

## 2015-02-27 NOTE — Progress Notes (Signed)
Patient ID: JANYTH RIERA, female   DOB: 05/30/1966, 49 y.o.   MRN: [id number]   Subjective:    Patient ID: Gwenlyn Fudge, female    DOB: 1966/07/24, 49 y.o.   MRN: [id number]  HPI  Patient here for a scheduled follow up.  Decreased stress.  Out of school for the summer.  Tries to stay active.  No cardiac symptoms with increased activity or exertion.  No sob.  No abdominal pain or cramping.  Bowels stable.  Due to f/u in 04/2015 with hematology.     Past Medical History  Diagnosis Date  . Hypertension   . Endometriosis   . Hypercholesterolemia   . Uterine fibroid   . Environmental allergies     Current Outpatient Prescriptions on File Prior to Visit  Medication Sig Dispense Refill  . atorvastatin (LIPITOR) 10 MG tablet Take 1 tablet (10 mg total) by mouth daily. 30 tablet 5  . baclofen (LIORESAL) 10 MG tablet Take 10 mg by mouth 3 (three) times daily as needed.   0  . felodipine (PLENDIL) 5 MG 24 hr tablet TAKE 1 TABLET (5 MG TOTAL) BY MOUTH DAILY. 90 tablet 1  . fluticasone (FLONASE) 50 MCG/ACT nasal spray Place 2 sprays into both nostrils daily. 16 g 5  . Omeprazole-Sodium Bicarbonate (ZEGERID OTC PO) Take by mouth.    . triamterene-hydrochlorothiazide (MAXZIDE-25) 37.5-25 MG per tablet Take 0.5 tablets by mouth daily. 30 tablet 3   No current facility-administered medications on file prior to visit.    Review of Systems  Constitutional: Negative for appetite change and unexpected weight change.  HENT: Negative for congestion and sinus pressure.   Respiratory: Negative for cough, chest tightness and shortness of breath.   Cardiovascular: Negative for chest pain, palpitations and leg swelling.  Gastrointestinal: Negative for nausea, vomiting, abdominal pain and diarrhea.  Neurological: Negative for dizziness, light-headedness and headaches.  Psychiatric/Behavioral: Negative for dysphoric mood and agitation.       Objective:     Blood pressure recheck:  118/68,  pulse 84  Physical Exam  Constitutional: She appears well-developed and well-nourished. No distress.  HENT:  Nose: Nose normal.  Mouth/Throat: Oropharynx is clear and moist.  Neck: Neck supple. No thyromegaly present.  Cardiovascular: Normal rate and regular rhythm.   Pulmonary/Chest: Breath sounds normal. No respiratory distress. She has no wheezes.  Abdominal: Soft. Bowel sounds are normal. There is no tenderness.  Musculoskeletal: She exhibits no edema or tenderness.  Lymphadenopathy:    She has no cervical adenopathy.  Skin: No rash noted. No erythema.  Psychiatric: She has a normal mood and affect. Her behavior is normal.    BP 121/68 mmHg  Pulse 88  Temp(Src) 98.5 F (36.9 C) (Oral)  Resp 18  Ht 5\' 6"  (1.676 m)  Wt 220 lb 6 oz (99.961 kg)  BMI 35.59 kg/m2  SpO2 100%  LMP 11/06/2000 Wt Readings from Last 3 Encounters:  02/27/15 220 lb 6 oz (99.961 kg)  12/05/14 217 lb 6 oz (98.601 kg)  10/28/14 217 lb 6 oz (98.601 kg)     Lab Results  Component Value Date   WBC 5.1 10/24/2014   HGB 13.0 10/24/2014   HCT 39 10/24/2014   PLT 414* 10/24/2014   GLUCOSE 102* 02/25/2015   CHOL 232* 02/25/2015   TRIG 62.0 02/25/2015   HDL 68.90 02/25/2015   LDLDIRECT 164.5 07/24/2013   LDLCALC 151* 02/25/2015   ALT 16 02/25/2015   AST 17 02/25/2015   NA 140  02/25/2015   K 3.9 02/25/2015   CL 102 02/25/2015   CREATININE 0.75 02/25/2015   BUN 12 02/25/2015   CO2 31 02/25/2015   TSH 1.36 06/04/2014   INR 1.0 04/21/2014       Assessment & Plan:   Problem List Items Addressed This Visit    Anemia    Followed by hematology.      Environmental allergies    Currently doing well.  Saw ENT - Dr Tami Ribas.       Esophageal reflux    On omeprazole.  Controlled.       Essential hypertension, benign    Blood pressure doing well.  Same medication regimen.  Follow pressures.  Follow metabolic panel.        Relevant Orders   TSH   Basic metabolic panel   Health care  maintenance - Primary    Physical 08/19/14.  colonosopy 02/2008.  Mammogram 11/04/14 - [facility]adsI.       MGUS (monoclonal gammopathy of unknown significance)    Seeing Dr Ma Hillock.  Has planned f/u 05/01/15.        Pure hypercholesterolemia    Low cholesterol diet and exercise.  Follow lipid panel and liver function tests.  On lipitor.   Has not been taking regularly.  Will start taking daily.  Hold on dose adjustment.        Relevant Orders   Lipid panel   Hepatic function panel   Thrombocytosis    CBC's followed through hematology.            Einar Pheasant, MD

## 2015-02-27 NOTE — Assessment & Plan Note (Signed)
Physical 08/19/14.  colonosopy 02/2008.  Mammogram 11/04/14 - [facility]adsI.

## 2015-03-01 ENCOUNTER — Encounter: Payer: Self-pay | Admitting: Internal Medicine

## 2015-03-01 NOTE — Assessment & Plan Note (Signed)
Low cholesterol diet and exercise.  Follow lipid panel and liver function tests.  On lipitor.   Has not been taking regularly.  Will start taking daily.  Hold on dose adjustment.

## 2015-03-01 NOTE — Assessment & Plan Note (Signed)
Blood pressure doing well.  Same medication regimen.  Follow pressures.  Follow metabolic panel.   

## 2015-03-01 NOTE — Assessment & Plan Note (Signed)
On omeprazole.  Controlled.   

## 2015-03-01 NOTE — Assessment & Plan Note (Signed)
Seeing Dr Ma Hillock.  Has planned f/u 05/01/15.

## 2015-03-01 NOTE — Assessment & Plan Note (Signed)
Followed by hematology 

## 2015-03-01 NOTE — Assessment & Plan Note (Signed)
CBC's followed through hematology.

## 2015-03-01 NOTE — Assessment & Plan Note (Signed)
Currently doing well.  Saw ENT - Dr Tami Ribas.

## 2015-04-30 ENCOUNTER — Ambulatory Visit: Payer: BC Managed Care – PPO | Admitting: Oncology

## 2015-04-30 ENCOUNTER — Other Ambulatory Visit: Payer: BC Managed Care – PPO

## 2015-04-30 ENCOUNTER — Inpatient Hospital Stay: Payer: BC Managed Care – PPO | Attending: Internal Medicine

## 2015-04-30 ENCOUNTER — Inpatient Hospital Stay (HOSPITAL_BASED_OUTPATIENT_CLINIC_OR_DEPARTMENT_OTHER): Payer: BC Managed Care – PPO | Admitting: Internal Medicine

## 2015-04-30 VITALS — BP 140/75 | HR 90 | Temp 97.5°F | Resp 20 | Wt 224.9 lb

## 2015-04-30 DIAGNOSIS — E780 Pure hypercholesterolemia: Secondary | ICD-10-CM

## 2015-04-30 DIAGNOSIS — I10 Essential (primary) hypertension: Secondary | ICD-10-CM

## 2015-04-30 DIAGNOSIS — Z79899 Other long term (current) drug therapy: Secondary | ICD-10-CM | POA: Insufficient documentation

## 2015-04-30 DIAGNOSIS — D472 Monoclonal gammopathy: Secondary | ICD-10-CM | POA: Insufficient documentation

## 2015-04-30 DIAGNOSIS — R221 Localized swelling, mass and lump, neck: Secondary | ICD-10-CM | POA: Insufficient documentation

## 2015-04-30 LAB — CBC WITH DIFFERENTIAL/PLATELET
Basophils Absolute: 0.1 10*3/uL (ref 0–0.1)
Basophils Relative: 2 %
Eosinophils Absolute: 0.1 10*3/uL (ref 0–0.7)
Eosinophils Relative: 2 %
HEMATOCRIT: 39.8 % (ref 35.0–47.0)
Hemoglobin: 13.3 g/dL (ref 12.0–16.0)
LYMPHS PCT: 38 %
Lymphs Abs: 2 10*3/uL (ref 1.0–3.6)
MCH: 28.4 pg (ref 26.0–34.0)
MCHC: 33.5 g/dL (ref 32.0–36.0)
MCV: 84.8 fL (ref 80.0–100.0)
MONO ABS: 0.3 10*3/uL (ref 0.2–0.9)
MONOS PCT: 5 %
NEUTROS ABS: 2.8 10*3/uL (ref 1.4–6.5)
Neutrophils Relative %: 53 %
Platelets: 423 10*3/uL (ref 150–440)
RBC: 4.69 MIL/uL (ref 3.80–5.20)
RDW: 14.2 % (ref 11.5–14.5)
WBC: 5.3 10*3/uL (ref 3.6–11.0)

## 2015-04-30 LAB — CREATININE, SERUM: Creatinine, Ser: 0.75 mg/dL (ref 0.44–1.00)

## 2015-04-30 LAB — CALCIUM: Calcium: 9.8 mg/dL (ref 8.9–10.3)

## 2015-04-30 NOTE — Progress Notes (Signed)
F/U visit for Multiple Myeloma. Feels well. The only concern she has is a nodule has developed in her neck under her chin. She seems to notice it intermittently. It is non tender. Denies any difficulty swallowing. Denies fatigue. Occ mild hot flashes. Denies fever. Appetite is good. Has intermittent joint/muscle achiness all over.

## 2015-05-01 ENCOUNTER — Ambulatory Visit
Admission: RE | Admit: 2015-05-01 | Discharge: 2015-05-01 | Disposition: A | Discharge status: Home / Self Care | Payer: BC Managed Care – PPO | Source: Ambulatory Visit | Attending: Internal Medicine | Admitting: Internal Medicine

## 2015-05-01 DIAGNOSIS — Q892 Congenital malformations of other endocrine glands: Secondary | ICD-10-CM | POA: Insufficient documentation

## 2015-05-01 DIAGNOSIS — D472 Monoclonal gammopathy: Secondary | ICD-10-CM | POA: Diagnosis present

## 2015-05-01 DIAGNOSIS — R221 Localized swelling, mass and lump, neck: Secondary | ICD-10-CM | POA: Diagnosis present

## 2015-05-01 LAB — KAPPA/LAMBDA LIGHT CHAINS
KAPPA FREE LGHT CHN: 17.91 mg/L (ref 3.30–19.40)
Kappa, lambda light chain ratio: 1.38 (ref 0.26–1.65)
Lambda free light chains: 12.97 mg/L (ref 5.71–26.30)

## 2015-05-01 LAB — PROTEIN ELECTROPHORESIS, SERUM
A/G Ratio: 1.1 (ref 0.7–1.7)
ALPHA-1-GLOBULIN: 0.2 g/dL (ref 0.0–0.4)
Albumin ELP: 3.7 g/dL (ref 2.9–4.4)
Alpha-2-Globulin: 0.7 g/dL (ref 0.4–1.0)
BETA GLOBULIN: 1.3 g/dL (ref 0.7–1.3)
GAMMA GLOBULIN: 1.2 g/dL (ref 0.4–1.8)
Globulin, Total: 3.5 g/dL (ref 2.2–3.9)
M-SPIKE, %: 0.3 g/dL — AB
TOTAL PROTEIN ELP: 7.2 g/dL (ref 6.0–8.5)

## 2015-05-01 MED ORDER — IOHEXOL 300 MG/ML  SOLN: 75.0000 mL | Freq: Once | INTRAMUSCULAR | Status: DC | PRN

## 2015-05-13 NOTE — Progress Notes (Signed)
[place]  [phone]) [phone] Fax:([phone]) [phone]     ID: Kathleen Gould OB: 03-22-1966  MR#: [id number]  KVQ#:[id number]  Patient Care Team: Einar Pheasant, MD as PCP - General (Internal Medicine)  CHIEF COMPLAINT/DIAGNOSIS:  Monoclonal Gammopathy of Unknown Significance (MGUS) diagnosed July 2015. Workup done on 04/21/14 - SIEP with M-spike of 0.5 g/dL (monoclonal protein with kappa light chain specificity). Serum LDH, Cr, CBC, Flow Cytometry, Skeletal survey all unremarkable.  [prior labs done on 03/03/14 reported SPEP (serum protein electrophoresis) with M-spike of 0.44 g/dL, total protein 7.4, platelets 279, creatinine 0.9, calcium 10.0. Prior to this on 02/25/14, Cr was 1.4 and calcium 10.7].    HISTORY OF PRESENT ILLNESS:  Patient returns for continued hematology followup, she was seen in [place] 2015. States she is doing steady, only complaint is she has noticed left upper neck mass which intermittently enlarges and becomes painful, currently it is shrunk back and is minimally painful. No fevers or night sweats. No new bone pains. Appetite is good, denies weight loss. No new rash or bleeding symptoms.   REVIEW OF SYSTEMS:   ROS As in HPI above. In addition, no fever, chills or sweats. No new headaches or focal weakness.  No new mood disturbances. No sore throat, cough, shortness of breath, sputum, hemoptysis or chest pain. No dizziness or palpitation. No abdominal pain, constipation, diarrhea, dysuria or hematuria. No new skin rash or bleeding symptoms. No new paresthesias in extremities.   PAST MEDICAL HISTORY: Reviewed. Past Medical History  Diagnosis Date  . Hypertension   . Endometriosis   . Hypercholesterolemia   . Uterine fibroid   . Environmental allergies     PAST SURGICAL HISTORY: Reviewed. Past Surgical History  Procedure Laterality Date  . Oophorectomy      S/P left secondary to cyst  . Cesarean section      x2  . Abdominal hysterectomy       FAMILY HISTORY: Reviewed. Family History  Problem Relation Age of Onset  . Heart attack Father 20  . Heart disease Father     bypass surgery  . Hypertension Father   . Hypercholesterolemia Father   . Kidney disease Father   . Diabetes      aunt    SOCIAL HISTORY: Reviewed. Social History  Substance Use Topics  . Smoking status: Never Smoker   . Smokeless tobacco: Never Used  . Alcohol Use: No    Allergies  Allergen Reactions  . Cefdinir Itching  . Simvastatin     Current Outpatient Prescriptions  Medication Sig Dispense Refill  . atorvastatin (LIPITOR) 10 MG tablet Take 1 tablet (10 mg total) by mouth daily. 30 tablet 5  . baclofen (LIORESAL) 10 MG tablet Take 10 mg by mouth 3 (three) times daily as needed.   0  . felodipine (PLENDIL) 5 MG 24 hr tablet TAKE 1 TABLET (5 MG TOTAL) BY MOUTH DAILY. 90 tablet 1  . fluticasone (FLONASE) 50 MCG/ACT nasal spray Place 2 sprays into both nostrils daily. 16 g 5  . Omeprazole-Sodium Bicarbonate (ZEGERID OTC PO) Take by mouth.    . triamterene-hydrochlorothiazide (MAXZIDE-25) 37.5-25 MG per tablet Take 0.5 tablets by mouth daily. 30 tablet 3   No current facility-administered medications for this visit.    PHYSICAL EXAM: Filed Vitals:   04/30/15 1120  BP: 140/75  Pulse: 90  Temp: 97.5 F (36.4 C)  Resp: 20     Body mass index is 36.31 kg/(m^2).      GENERAL:  Patient is alert and oriented and in no acute distress. There is no icterus. HEENT: EOMs intact. Oral exam negative for thrush or lesions. Has soft-firm mass palpable mass ~2cm in left submandibular area. No other cervical lymphadenopathy. CVS: S1S2, regular LUNGS: Bilaterally clear to auscultation, no rhonchi. ABDOMEN: Soft, nontender. No hepatosplenomegaly clinically.  EXTREMITIES: No pedal edema. LYMPHATICS: No palpable adenopathy in axillary or inguinal areas.   LAB RESULTS:    Component Value Date/Time   NA 140 02/25/2015 0849   K 3.9 02/25/2015 0849     CL 102 02/25/2015 0849   CO2 31 02/25/2015 0849   GLUCOSE 102* 02/25/2015 0849   BUN 12 02/25/2015 0849   CREATININE 0.75 04/30/2015 1116   CREATININE 0.81 04/21/2014 1629   CALCIUM 9.8 04/30/2015 1116   CALCIUM 9.3 04/21/2014 1629   PROT 8.0 02/25/2015 0849   ALBUMIN 4.4 02/25/2015 0849   AST 17 02/25/2015 0849   ALT 16 02/25/2015 0849   ALKPHOS 73 02/25/2015 0849   BILITOT 0.5 02/25/2015 0849   GFRNONAA >60 04/30/2015 1116   GFRNONAA >60 04/21/2014 1629   GFRAA >60 04/30/2015 1116   GFRAA >60 04/21/2014 1629    Lab Results  Component Value Date   WBC 5.3 04/30/2015   NEUTROABS 2.8 04/30/2015   HGB 13.3 04/30/2015   HCT 39.8 04/30/2015   MCV 84.8 04/30/2015   PLT 423 04/30/2015    04/30/15 - SIEP with M-spike of 0.3 g/dL (was 0.5 in Aug 2015. Monoclonal protein with kappa light chain specificity).  04/30/15 - serum kappa 17.91, lambda 12.97, ratio 1.38. Aug 2015 - Serum LDH, Cr, Flow Cytometry, Skeletal survey all unremarkable. 03/03/14 - SPEP with M-spike of 0.44 g/dL, total protein 7.4, platelets 279, Cr 0.9, calcium 10.0.  02/25/14 - Cr was 1.4 and calcium 10.7.    ASSESSMENT / PLAN:   1. Monoclonal Gammopathy of Unknown Significance (MGUS) diagnosed July 2015. Workup done on 04/21/14 - SIEP with M-spike of 0.5 g/dL (monoclonal protein with kappa light chain specificity). Serum LDH, Cr, CBC, Flow Cytometry, Skeletal survey all unremarkable -  Reviewed labs and d/w patient. Prior peripheral blood flow cytometry is unremarkable for any monoclonal B-cell population or immunophenotypic abhnormalities. Given this, plan is continued monitoring of monoclonal gammopathy of unknown significance for which no treatment is indicated at this time. Will schedule for labs at 6 months (CBC, creatinine, calcium, SIEP and serum kappa/lamda quantitative ratio) and otherwise see her back at 1 year with repeat labs.  2. Left neck palpable mass - will get CT scan of neck and refer to ENT if  indicated. 3. In between visits, patient advised to call or come to ER in case of any fevers, night sweats, unintentional weight loss, bone pains, or other new symptoms and will be evaluated sooner. She is agreeable to this plan.       Leia Alf, MD   05/13/2015 6:18 PM

## 2015-05-15 ENCOUNTER — Other Ambulatory Visit: Payer: Self-pay | Admitting: Internal Medicine

## 2015-05-15 DIAGNOSIS — D472 Monoclonal gammopathy: Secondary | ICD-10-CM

## 2015-05-16 ENCOUNTER — Encounter: Payer: Self-pay | Admitting: Internal Medicine

## 2015-05-17 NOTE — Telephone Encounter (Signed)
Pt sent my chart message about Dr Reola Mosher evaluation.  I have not received anything from ENT.  Please call and see if we can get info from ENT.  Thanks.    Dr Nicki Reaper

## 2015-05-17 NOTE — Telephone Encounter (Signed)
Noted sent to pt regarding obtaining ENT information.

## 2015-05-18 ENCOUNTER — Encounter
Admission: RE | Admit: 2015-05-18 | Discharge: 2015-05-18 | Disposition: A | Discharge status: Home / Self Care | Payer: BC Managed Care – PPO | Source: Ambulatory Visit | Attending: Otolaryngology | Admitting: Otolaryngology

## 2015-05-18 DIAGNOSIS — Z01810 Encounter for preprocedural cardiovascular examination: Secondary | ICD-10-CM | POA: Diagnosis present

## 2015-05-18 DIAGNOSIS — Z01812 Encounter for preprocedural laboratory examination: Secondary | ICD-10-CM | POA: Insufficient documentation

## 2015-05-18 HISTORY — DX: Gastro-esophageal reflux disease without esophagitis: K21.9

## 2015-05-18 HISTORY — DX: Unspecified osteoarthritis, unspecified site: M19.90

## 2015-05-18 LAB — POTASSIUM: POTASSIUM: 3.3 mmol/L — AB (ref 3.5–5.1)

## 2015-05-18 NOTE — Patient Instructions (Signed)
  Your procedure is scheduled on: May 27, 2015 (Wednesday) Report to Day Surgery.[facility]) To find out your arrival time please call [phone] between 1PM - 3PM on May 25, 2105 (Tuesday).  Remember: Instructions that are not followed completely may result in serious medical risk, up to and including death, or upon the discretion of your surgeon and anesthesiologist your surgery may need to be rescheduled.    __x__ 1. Do not eat food or drink liquids after midnight. No gum chewing or hard candies.     ____ 2. No Alcohol for 24 hours before or after surgery.   ____ 3. Bring all medications with you on the day of surgery if instructed.    __x__ 4. Notify your doctor if there is any change in your medical condition     (cold, fever, infections).     Do not wear jewelry, make-up, hairpins, clips or nail polish.  Do not wear lotions, powders, or perfumes. You may wear deodorant.  Do not shave 48 hours prior to surgery. Men may shave face and neck.  Do not bring valuables to the hospital.    [facility] is not responsible for any belongings or valuables.               Contacts, dentures or bridgework may not be worn into surgery.  Leave your suitcase in the car. After surgery it may be brought to your room.  For patients admitted to the hospital, discharge time is determined by your                treatment team.   Patients discharged the day of surgery will not be allowed to drive home.   Please read over the following fact sheets that you were given:      _x___ Take these medicines the morning of surgery with A SIP OF WATER:    1. Felodipine  2.   3.   4.  5.  6.  ____ Fleet Enema (as directed)   _x__ Use CHG Soap as directed  ____ Use inhalers on the day of surgery  ____ Stop metformin 2 days prior to surgery    ____ Take 1/2 of usual insulin dose the night before surgery and none on the morning of surgery.   ____ Stop Coumadin/Plavix/aspirin on    __x__ Stop Anti-inflammatories on (STOP ALEVE 7-10 DAYS BEFORE SURGERY) TYLENOL OK TO TAKE FOR PAIN   ____ Stop supplements until after surgery.    ____ Bring C-Pap to the hospital.

## 2015-05-19 NOTE — Pre-Procedure Instructions (Signed)
Potassium results (3.3) called and faxed to Dois Davenport, surgery coordinator , and instructed that patient need to be started on potassium supplement for surgery.

## 2015-05-21 ENCOUNTER — Telehealth: Payer: Self-pay | Admitting: Internal Medicine

## 2015-05-22 ENCOUNTER — Other Ambulatory Visit: Payer: Self-pay | Admitting: *Deleted

## 2015-05-22 MED ORDER — POTASSIUM CHLORIDE ER 10 MEQ PO TBCR: EXTENDED_RELEASE_TABLET | ORAL | Status: DC

## 2015-05-22 NOTE — Telephone Encounter (Signed)
Start kcl 51meq bid x 2 days and the 26meq q day.  Recheck potassium prior to surgery and also send me results.

## 2015-05-22 NOTE — Telephone Encounter (Signed)
PT notified & was instructed to start on Potassium asap. Information was also faxed back to [facility] (Attn: Dois Davenport)

## 2015-05-27 ENCOUNTER — Ambulatory Visit: Payer: BC Managed Care – PPO | Admitting: Anesthesiology

## 2015-05-27 ENCOUNTER — Encounter
Admission: RE | Disposition: A | Discharge status: Home / Self Care | Payer: Self-pay | Source: Ambulatory Visit | Attending: Otolaryngology

## 2015-05-27 ENCOUNTER — Encounter: Payer: Self-pay | Admitting: Internal Medicine

## 2015-05-27 ENCOUNTER — Ambulatory Visit
Admission: RE | Admit: 2015-05-27 | Discharge: 2015-05-27 | Disposition: A | Discharge status: Home / Self Care | Payer: BC Managed Care – PPO | Source: Ambulatory Visit | Attending: Otolaryngology | Admitting: Otolaryngology

## 2015-05-27 ENCOUNTER — Encounter: Payer: Self-pay | Admitting: *Deleted

## 2015-05-27 DIAGNOSIS — R221 Localized swelling, mass and lump, neck: Secondary | ICD-10-CM | POA: Diagnosis present

## 2015-05-27 DIAGNOSIS — Z6832 Body mass index (BMI) 32.0-32.9, adult: Secondary | ICD-10-CM | POA: Insufficient documentation

## 2015-05-27 DIAGNOSIS — Q892 Congenital malformations of other endocrine glands: Secondary | ICD-10-CM | POA: Insufficient documentation

## 2015-05-27 DIAGNOSIS — E669 Obesity, unspecified: Secondary | ICD-10-CM | POA: Diagnosis not present

## 2015-05-27 DIAGNOSIS — K219 Gastro-esophageal reflux disease without esophagitis: Secondary | ICD-10-CM | POA: Insufficient documentation

## 2015-05-27 DIAGNOSIS — Z888 Allergy status to other drugs, medicaments and biological substances status: Secondary | ICD-10-CM | POA: Diagnosis not present

## 2015-05-27 DIAGNOSIS — Z79899 Other long term (current) drug therapy: Secondary | ICD-10-CM | POA: Diagnosis not present

## 2015-05-27 DIAGNOSIS — I10 Essential (primary) hypertension: Secondary | ICD-10-CM | POA: Insufficient documentation

## 2015-05-27 DIAGNOSIS — Z833 Family history of diabetes mellitus: Secondary | ICD-10-CM | POA: Diagnosis not present

## 2015-05-27 DIAGNOSIS — Z8249 Family history of ischemic heart disease and other diseases of the circulatory system: Secondary | ICD-10-CM | POA: Insufficient documentation

## 2015-05-27 DIAGNOSIS — Z7951 Long term (current) use of inhaled steroids: Secondary | ICD-10-CM | POA: Insufficient documentation

## 2015-05-27 HISTORY — PX: THYROGLOSSAL DUCT CYST: SHX297

## 2015-05-27 LAB — POCT I-STAT 4, (NA,K, GLUC, HGB,HCT)
Glucose, Bld: 99 mg/dL (ref 65–99)
HEMATOCRIT: 41 % (ref 36.0–46.0)
Hemoglobin: 13.9 g/dL (ref 12.0–15.0)
Potassium: 3.9 mmol/L (ref 3.5–5.1)
Sodium: 140 mmol/L (ref 135–145)

## 2015-05-27 LAB — POTASSIUM: Potassium: 3.9 mmol/L

## 2015-05-27 SURGERY — EXCISION, THYROGLOSSAL DUCT CYST
Anesthesia: General | Wound class: Clean

## 2015-05-27 MED ORDER — PROMETHAZINE HCL 25 MG/ML IJ SOLN
INTRAMUSCULAR | Status: AC
  Administered 2015-05-27: 6.25 mg via INTRAVENOUS
  Filled 2015-05-27: qty 1

## 2015-05-27 MED ORDER — REMIFENTANIL HCL 1 MG IV SOLR
INTRAVENOUS | Status: DC | PRN
  Administered 2015-05-27: .07 ug/kg/min via INTRAVENOUS

## 2015-05-27 MED ORDER — BACITRACIN ZINC 500 UNIT/GM EX OINT
TOPICAL_OINTMENT | CUTANEOUS | Status: AC
  Filled 2015-05-27: qty 28.35

## 2015-05-27 MED ORDER — ROCURONIUM BROMIDE 100 MG/10ML IV SOLN
INTRAVENOUS | Status: DC | PRN
  Administered 2015-05-27: 5 mg via INTRAVENOUS

## 2015-05-27 MED ORDER — FAMOTIDINE 20 MG PO TABS
20.0000 mg | ORAL_TABLET | Freq: Once | ORAL | Status: AC
  Administered 2015-05-27: 20 mg via ORAL

## 2015-05-27 MED ORDER — DEXAMETHASONE SODIUM PHOSPHATE 4 MG/ML IJ SOLN
INTRAMUSCULAR | Status: DC | PRN
  Administered 2015-05-27: 10 mg via INTRAVENOUS

## 2015-05-27 MED ORDER — ONDANSETRON HCL 4 MG/2ML IJ SOLN
INTRAMUSCULAR | Status: DC | PRN
  Administered 2015-05-27: 4 mg via INTRAVENOUS

## 2015-05-27 MED ORDER — PHENYLEPHRINE HCL 10 MG/ML IJ SOLN
INTRAMUSCULAR | Status: DC | PRN
  Administered 2015-05-27 (×4): 100 ug via INTRAVENOUS

## 2015-05-27 MED ORDER — FENTANYL CITRATE (PF) 100 MCG/2ML IJ SOLN
INTRAMUSCULAR | Status: DC | PRN
  Administered 2015-05-27: 100 ug via INTRAVENOUS

## 2015-05-27 MED ORDER — CLINDAMYCIN HCL 300 MG PO CAPS: 300.0000 mg | ORAL_CAPSULE | Freq: Four times a day (QID) | ORAL | Status: DC

## 2015-05-27 MED ORDER — LACTATED RINGERS IV SOLN
INTRAVENOUS | Status: DC
Start: 2015-05-27 — End: 2015-05-27
  Administered 2015-05-27 (×2): via INTRAVENOUS

## 2015-05-27 MED ORDER — LIDOCAINE HCL (CARDIAC) 20 MG/ML IV SOLN
INTRAVENOUS | Status: DC | PRN
  Administered 2015-05-27: 100 mg via INTRAVENOUS

## 2015-05-27 MED ORDER — FENTANYL CITRATE (PF) 100 MCG/2ML IJ SOLN
INTRAMUSCULAR | Status: AC
  Administered 2015-05-27: 25 ug via INTRAVENOUS
  Filled 2015-05-27: qty 2

## 2015-05-27 MED ORDER — EPHEDRINE SULFATE 50 MG/ML IJ SOLN
INTRAMUSCULAR | Status: DC | PRN
Start: 2015-05-27 — End: 2015-05-27
  Administered 2015-05-27 (×2): 10 mg via INTRAVENOUS
  Administered 2015-05-27: 5 mg via INTRAVENOUS

## 2015-05-27 MED ORDER — PROPOFOL 10 MG/ML IV BOLUS
INTRAVENOUS | Status: DC | PRN
  Administered 2015-05-27: 140 mg via INTRAVENOUS

## 2015-05-27 MED ORDER — BACITRACIN 500 UNIT/GM EX OINT
TOPICAL_OINTMENT | CUTANEOUS | Status: DC | PRN
  Administered 2015-05-27: 1 via TOPICAL

## 2015-05-27 MED ORDER — SUCCINYLCHOLINE CHLORIDE 20 MG/ML IJ SOLN
INTRAMUSCULAR | Status: DC | PRN
  Administered 2015-05-27: 100 mg via INTRAVENOUS

## 2015-05-27 MED ORDER — PHENYLEPHRINE HCL 10 MG/ML IJ SOLN
10.0000 mg | INTRAVENOUS | Status: DC | PRN
  Administered 2015-05-27: 10 ug/min via INTRAVENOUS

## 2015-05-27 MED ORDER — HYDROCODONE-ACETAMINOPHEN 5-325 MG PO TABS: ORAL_TABLET | ORAL | Status: DC

## 2015-05-27 MED ORDER — ONDANSETRON HCL 4 MG/2ML IJ SOLN
INTRAMUSCULAR | Status: AC
  Administered 2015-05-27: 4 mg via INTRAVENOUS
  Filled 2015-05-27: qty 2

## 2015-05-27 MED ORDER — SODIUM CHLORIDE 0.9 % IR SOLN
Status: DC | PRN
  Administered 2015-05-27: 125 mL

## 2015-05-27 MED ORDER — LIDOCAINE-EPINEPHRINE (PF) 1 %-1:200000 IJ SOLN
INTRAMUSCULAR | Status: AC
  Filled 2015-05-27: qty 30

## 2015-05-27 MED ORDER — MIDAZOLAM HCL 2 MG/2ML IJ SOLN
INTRAMUSCULAR | Status: DC | PRN
  Administered 2015-05-27 (×2): 1 mg via INTRAVENOUS

## 2015-05-27 MED ORDER — LIDOCAINE-EPINEPHRINE (PF) 1 %-1:200000 IJ SOLN
INTRAMUSCULAR | Status: DC | PRN
Start: 2015-05-27 — End: 2015-05-27
  Administered 2015-05-27: 5 mL

## 2015-05-27 MED ORDER — PROMETHAZINE HCL 25 MG/ML IJ SOLN
6.2500 mg | Freq: Once | INTRAMUSCULAR | Status: AC
  Administered 2015-05-27: 6.25 mg via INTRAVENOUS

## 2015-05-27 MED ORDER — FAMOTIDINE 20 MG PO TABS
ORAL_TABLET | ORAL | Status: AC
  Filled 2015-05-27: qty 1

## 2015-05-27 MED ORDER — FENTANYL CITRATE (PF) 100 MCG/2ML IJ SOLN
25.0000 ug | INTRAMUSCULAR | Status: DC | PRN
  Administered 2015-05-27 (×4): 25 ug via INTRAVENOUS

## 2015-05-27 MED ORDER — ONDANSETRON HCL 4 MG/2ML IJ SOLN
4.0000 mg | Freq: Once | INTRAMUSCULAR | Status: AC | PRN
Start: 2015-05-27 — End: 2015-05-27
  Administered 2015-05-27: 4 mg via INTRAVENOUS

## 2015-05-27 SURGICAL SUPPLY — 31 items
BLADE SURG 15 STRL LF DISP TIS (BLADE) ×1 IMPLANT
BLADE SURG 15 STRL SS (BLADE) ×2
CANISTER SUCT 1200ML W/VALVE (MISCELLANEOUS) ×3 IMPLANT
CORD BIP STRL DISP 12FT (MISCELLANEOUS) ×3 IMPLANT
DRAIN TLS ROUND 10FR (DRAIN) ×3 IMPLANT
DRAPE MAG INST 16X20 L/F (DRAPES) ×3 IMPLANT
DRSG TEGADERM 2-3/8X2-3/4 SM (GAUZE/BANDAGES/DRESSINGS) IMPLANT
ELECT CAUTERY BLADE TIP 2.5 (TIP) ×3
ELECT NEEDLE 20X.3 GREEN (MISCELLANEOUS)
ELECTRODE CAUTERY BLDE TIP 2.5 (TIP) ×1 IMPLANT
ELECTRODE NEEDLE 20X.3 GREEN (MISCELLANEOUS) IMPLANT
FORCEPS JEWEL BIP 4-3/4 STR (INSTRUMENTS) ×3 IMPLANT
GLOVE BIO SURGEON STRL SZ7.5 (GLOVE) ×9 IMPLANT
GOWN STRL REUS W/ TWL LRG LVL3 (GOWN DISPOSABLE) ×2 IMPLANT
GOWN STRL REUS W/TWL LRG LVL3 (GOWN DISPOSABLE) ×4
HARMONIC SCALPEL FOCUS (MISCELLANEOUS) ×3 IMPLANT
HEMOSTAT SURGICEL 2X3 (HEMOSTASIS) ×3 IMPLANT
HOOK STAY BLUNT/RETRACTOR 5M (MISCELLANEOUS) ×3 IMPLANT
KIT RM TURNOVER STRD PROC AR (KITS) ×3 IMPLANT
NS IRRIG 500ML POUR BTL (IV SOLUTION) ×3 IMPLANT
PACK HEAD/NECK (MISCELLANEOUS) ×3 IMPLANT
PAD GROUND ADULT SPLIT (MISCELLANEOUS) ×3 IMPLANT
PROBE MONO 100X0.75 ELECT 1.9M (MISCELLANEOUS) IMPLANT
SPONGE KITTNER 5P (MISCELLANEOUS) ×3 IMPLANT
SPONGE XRAY 4X4 16PLY STRL (MISCELLANEOUS) IMPLANT
SUT ETHILON 5-0 FS-2 18 BLK (SUTURE) ×3 IMPLANT
SUT SILK 2 0 (SUTURE) ×2
SUT SILK 2-0 18XBRD TIE 12 (SUTURE) ×1 IMPLANT
SUT VIC AB 4-0 RB1 18 (SUTURE) ×3 IMPLANT
SWAB DUAL CULTURE TRANS RED ST (MISCELLANEOUS) ×3 IMPLANT
SYSTEM CHEST DRAIN TLS 7FR (DRAIN) IMPLANT

## 2015-05-27 NOTE — Anesthesia Preprocedure Evaluation (Signed)
Anesthesia Evaluation  Patient identified by MRN, date of birth, ID band Patient awake    Reviewed: Allergy & Precautions, NPO status , Patient's Chart, lab work & pertinent test results  History of Anesthesia Complications Negative for: history of anesthetic complications  Airway Mallampati: II       Dental no notable dental hx.    Pulmonary neg pulmonary ROS,    Pulmonary exam normal        Cardiovascular hypertension, Pt. on medications Normal cardiovascular exam     Neuro/Psych    GI/Hepatic Neg liver ROS, GERD  ,  Endo/Other  negative endocrine ROS  Renal/GU negative Renal ROS     Musculoskeletal   Abdominal (+) + obese,   Peds  Hematology  (+) anemia ,   Anesthesia Other Findings   Reproductive/Obstetrics                             Anesthesia Physical Anesthesia Plan  ASA: II  Anesthesia Plan: General   Post-op Pain Management:    Induction: Intravenous  Airway Management Planned: Oral ETT  Additional Equipment:   Intra-op Plan:   Post-operative Plan: Extubation in OR  Informed Consent: I have reviewed the patients History and Physical, chart, labs and discussed the procedure including the risks, benefits and alternatives for the proposed anesthesia with the patient or authorized representative who has indicated his/her understanding and acceptance.     Plan Discussed with: CRNA  Anesthesia Plan Comments:         Anesthesia Quick Evaluation

## 2015-05-27 NOTE — Anesthesia Postprocedure Evaluation (Signed)
  Anesthesia Post-op Note  Patient: Kathleen Gould  Procedure(s) Performed: Procedure(s): THYROGLOSSAL DUCT CYST EXCISION  (N/A)  Anesthesia type:General  Patient location: PACU  Post pain: Pain level controlled  Post assessment: Post-op Vital signs reviewed, Patient's Cardiovascular Status Stable, Respiratory Function Stable, Patent Airway and No signs of Nausea or vomiting  Post vital signs: Reviewed and stable  Last Vitals:  Filed Vitals:   05/27/15 0559  BP: 126/87  Pulse: 84  Temp: 35.8 C  Resp: 16    Level of consciousness: awake, alert  and patient cooperative  Complications: No apparent anesthesia complications

## 2015-05-27 NOTE — Op Note (Signed)
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 Cultures were sent.  Procedure: After the patient was identified in holding and the procedure was reviewed.  The patient was taken to the operating room and with the patient in a comfortable supine position,  general endotracheal anesthesia was induced without difficulty.  A proper time-out was performed, confirming the operative site and procedure.  The patient was placed on a shoulder roll to extend the neck slightly. The skin overlying the cyst was then injected with 1% lidocaine with epinephrine 1-200,000. Patient was then prepped and draped in the usual sterile fashion.  A 15 blade was used to incise the skin carrying the incision down through the platysmal fascia with the Bovie. The cyst was encountered, partially covered by the sternohyoid muscle on either side. This was carefully dissected away from the cyst, taking care not to violate the cyst wall. Dissection began inferiorly and laterally freeing up the cyst from the strap muscles. There was a fair amount of inflammation from prior infection, so the cyst was somewhat stuck to the strap muscles, and a portion of strap muscle was taken and some areas to reduce risk of violating the cyst wall. This dissection proceeded up until reaching the hyoid bone. Attention was then turned superiorly where the cyst was freed up from the mylohyoid muscle and dissection proceeded through the mylohyoid hyoid muscle over the top of the hyoid bone. Muscle was dissected away from the hyoid bone laterally in the region of the  lesser cornu to expose the hyoid bone completely. The hyoid bone was then divided on either side roughly at the level of the lesser cornu with bone rongeurs. The hyoid bone was then grasped and dissection proceeded deeper towards the tongue base with the bipolar cautery. The cyst did rupture at this point spilling some purulent material. This was cultured. The hyoid bone in this case was fairly close to the tongue base and I could see the mucosa of the vallecula during the dissection of the hyoid bone undersurface. Care was taken not to violate the pharynx during this dissection. The hyoid bone was dissected away and sent along with the attached cyst as a specimen. The wound was then irrigated with sterile saline and carefully inspected for residual bleeding. Minor bleeding was controlled with bipolar cautery followed by placement of Surgicel. The strap muscles were then reapproximated in the midline with 4-0 Vicryl suture to close the dead space. The subcutaneous tissues were closed with 4-0 Vicryl suture, and the skin subsequent we closed with 5-0 nylon suture. Prior to suturing the wound closed a #10 TLS drain was placed through a separate opening in the skin and and this was subsequently secured with 5-0 nylon suture. Bacitracin ointment was applied and the patient returned the anesthesiologist for awakening.  The patient was awakened and taken to the recovery room in good condition.   Disposition:   PACU for recovery then discharge home if doing well.  Plan: Antibiotics and pain medication. She will return tomorrow to the office for drain removal.  Riley Nearing 05/27/2015 9:40 AM

## 2015-05-27 NOTE — H&P (Signed)
History and physical reviewed and will be scanned in later. No change in medical status reported by the patient or family, appears stable for surgery. All questions regarding the procedure answered, and patient (or family if a child) expressed understanding of the procedure.  Kathleen Gould @TODAY@ 

## 2015-05-27 NOTE — Transfer of Care (Signed)
Immediate Anesthesia Transfer of Care Note  Patient: Kathleen Gould  Procedure(s) Performed: Procedure(s): THYROGLOSSAL DUCT CYST EXCISION  (N/A)  Patient Location: PACU  Anesthesia Type:General  Level of Consciousness: awake, alert  and oriented  Airway & Oxygen Therapy: Patient Spontanous Breathing and Patient connected to face mask oxygen  Post-op Assessment: Report given to RN and Post -op Vital signs reviewed and stable  Post vital signs: Reviewed and stable  Last Vitals:  Filed Vitals:   05/27/15 0559  BP: 126/87  Pulse: 84  Temp: 35.8 C  Resp: 16    Complications: No apparent anesthesia complications

## 2015-05-28 LAB — SURGICAL PATHOLOGY

## 2015-05-29 ENCOUNTER — Other Ambulatory Visit (INDEPENDENT_AMBULATORY_CARE_PROVIDER_SITE_OTHER): Payer: BC Managed Care – PPO

## 2015-05-29 DIAGNOSIS — D472 Monoclonal gammopathy: Secondary | ICD-10-CM

## 2015-05-29 DIAGNOSIS — I10 Essential (primary) hypertension: Secondary | ICD-10-CM

## 2015-05-29 DIAGNOSIS — E7800 Pure hypercholesterolemia, unspecified: Secondary | ICD-10-CM

## 2015-05-29 LAB — BASIC METABOLIC PANEL
BUN: 11 mg/dL (ref 6–23)
CALCIUM: 10 mg/dL (ref 8.4–10.5)
CHLORIDE: 103 meq/L (ref 96–112)
CO2: 32 mEq/L (ref 19–32)
CREATININE: 0.8 mg/dL (ref 0.40–1.20)
GFR: 97.24 mL/min (ref >60.00)
Glucose, Bld: 87 mg/dL (ref 70–99)
Potassium: 3.9 mEq/L (ref 3.5–5.1)
Sodium: 142 mEq/L (ref 135–145)

## 2015-05-29 LAB — HEPATIC FUNCTION PANEL
ALK PHOS: 67 U/L (ref 39–117)
ALT: 13 U/L (ref 0–35)
AST: 11 U/L (ref 0–37)
Albumin: 4.1 g/dL (ref 3.5–5.2)
BILIRUBIN DIRECT: 0.1 mg/dL (ref 0.0–0.3)
Total Bilirubin: 0.6 mg/dL (ref 0.2–1.2)
Total Protein: 7.3 g/dL (ref 6.0–8.3)

## 2015-05-29 LAB — LIPID PANEL
CHOLESTEROL: 223 mg/dL — AB (ref 0–200)
HDL: 77 mg/dL (ref >39.00)
LDL Cholesterol: 128 mg/dL — ABNORMAL HIGH (ref 0–99)
NonHDL: 145.97
TRIGLYCERIDES: 92 mg/dL (ref 0.0–149.0)
Total CHOL/HDL Ratio: 3
VLDL: 18.4 mg/dL (ref 0.0–40.0)

## 2015-05-29 LAB — TSH: TSH: 1.64 u[IU]/mL (ref 0.35–4.50)

## 2015-05-31 ENCOUNTER — Encounter: Payer: Self-pay | Admitting: Internal Medicine

## 2015-05-31 LAB — ANAEROBIC CULTURE

## 2015-05-31 LAB — WOUND CULTURE: Culture: NO GROWTH

## 2015-06-02 ENCOUNTER — Other Ambulatory Visit: Payer: BC Managed Care – PPO

## 2015-06-02 NOTE — Telephone Encounter (Signed)
Unread mychart message mailed to patient 

## 2015-06-03 ENCOUNTER — Other Ambulatory Visit: Payer: BC Managed Care – PPO

## 2015-06-04 ENCOUNTER — Encounter: Payer: Self-pay | Admitting: Internal Medicine

## 2015-06-04 ENCOUNTER — Ambulatory Visit (INDEPENDENT_AMBULATORY_CARE_PROVIDER_SITE_OTHER): Payer: BC Managed Care – PPO | Admitting: Internal Medicine

## 2015-06-04 VITALS — BP 118/70 | HR 90 | Temp 98.3°F | Resp 18 | Ht 66.0 in | Wt 221.5 lb

## 2015-06-04 DIAGNOSIS — K219 Gastro-esophageal reflux disease without esophagitis: Secondary | ICD-10-CM

## 2015-06-04 DIAGNOSIS — D472 Monoclonal gammopathy: Secondary | ICD-10-CM | POA: Diagnosis not present

## 2015-06-04 DIAGNOSIS — E7800 Pure hypercholesterolemia, unspecified: Secondary | ICD-10-CM

## 2015-06-04 DIAGNOSIS — I10 Essential (primary) hypertension: Secondary | ICD-10-CM

## 2015-06-04 DIAGNOSIS — D75839 Thrombocytosis, unspecified: Secondary | ICD-10-CM

## 2015-06-04 DIAGNOSIS — D473 Essential (hemorrhagic) thrombocythemia: Secondary | ICD-10-CM | POA: Diagnosis not present

## 2015-06-04 DIAGNOSIS — Q892 Congenital malformations of other endocrine glands: Secondary | ICD-10-CM

## 2015-06-04 NOTE — Progress Notes (Signed)
Patient ID: Kathleen Gould, female   DOB: 01/17/1966, 49 y.o.   MRN: [id number]   Subjective:    Patient ID: Kathleen Gould, female    DOB: Oct 17, 1965, 49 y.o.   MRN: [id number]  HPI  Patient with past history of hypertension, MGUS and hypercholesterolemia.  She comes in today to follow up on these issues.  She recently had surgery for removal of a thyroglossal duct cyst.  Followed by ENT.  Is doing well.  Swallowing ok.  No acid reflux.  No abdominal pain or cramping.  No cardiac symptoms with increased activity or exertion.  No sob.  No bowel change.  Handling stress.     Past Medical History  Diagnosis Date  . Hypertension   . Endometriosis   . Hypercholesterolemia   . Uterine fibroid   . Environmental allergies   . GERD (gastroesophageal reflux disease)   . Arthritis    Past Surgical History  Procedure Laterality Date  . Oophorectomy      S/P left secondary to cyst  . Cesarean section      x2  . Abdominal hysterectomy    . Thyroglossal duct cyst N/A 05/27/2015    Procedure: THYROGLOSSAL DUCT CYST EXCISION ;  Surgeon: Clyde Canterbury, MD;  Location: [facility] ORS;  Service: ENT;  Laterality: N/A;   Family History  Problem Relation Age of Onset  . Heart attack Father 15  . Heart disease Father     bypass surgery  . Hypertension Father   . Hypercholesterolemia Father   . Kidney disease Father   . Diabetes      aunt  . Diabetes Mother    Social History   Social History  . Marital Status: Married    Spouse Name: N/A  . Number of Children: 2  . Years of Education: N/A   Social History Main Topics  . Smoking status: Never Smoker   . Smokeless tobacco: Never Used  . Alcohol Use: No  . Drug Use: No  . Sexual Activity: Not Asked   Other Topics Concern  . None   Social History Narrative    Outpatient Encounter Prescriptions as of 06/04/2015  Medication Sig  . atorvastatin (LIPITOR) 10 MG tablet Take 1 tablet (10 mg total) by mouth daily. (Patient taking  differently: Take 10 mg by mouth daily at 6 PM. )  . clindamycin (CLEOCIN) 300 MG capsule Take 1 capsule (300 mg total) by mouth 4 (four) times daily.  . felodipine (PLENDIL) 5 MG 24 hr tablet TAKE 1 TABLET (5 MG TOTAL) BY MOUTH DAILY.  . fluticasone (FLONASE) 50 MCG/ACT nasal spray Place 2 sprays into both nostrils daily.  [name] Take 1 capsule by mouth daily.   . potassium chloride (K-DUR) 10 MEQ tablet Take one tablet twice a day for two days and then take one tablet a day.  . triamterene-hydrochlorothiazide (MAXZIDE-25) 37.5-25 MG per tablet Take 0.5 tablets by mouth daily.  . [DISCONTINUED] HYDROcodone-acetaminophen (NORCO/VICODIN) 5-325 MG tablet 1-2 tablets every 4-6 hours as needed for pain   No facility-administered encounter medications on file as of 06/04/2015.    Review of Systems  Constitutional: Negative for appetite change and unexpected weight change.  HENT: Negative for congestion and sinus pressure.   Eyes: Negative for pain and visual disturbance.  Respiratory: Negative for cough, chest tightness and shortness of breath.   Cardiovascular: Negative for chest pain, palpitations and leg swelling.  Gastrointestinal: Negative for nausea, vomiting, abdominal pain and  diarrhea.  Musculoskeletal: Negative for back pain and joint swelling.  Skin: Negative for color change and rash.  Neurological: Negative for dizziness, light-headedness and headaches.  Psychiatric/Behavioral: Negative for dysphoric mood and agitation.       Objective:    Physical Exam  Constitutional: She appears well-developed and well-nourished. No distress.  HENT:  Nose: Nose normal.  Mouth/Throat: Oropharynx is clear and moist.  Eyes: Conjunctivae are normal. Right eye exhibits no discharge. Left eye exhibits no discharge.  Neck: Neck supple. No thyromegaly present.  Cardiovascular: Normal rate and regular rhythm.   Pulmonary/Chest: Breath sounds normal. No  respiratory distress. She has no wheezes.  Abdominal: Soft. Bowel sounds are normal. There is no tenderness.  Musculoskeletal: She exhibits no edema or tenderness.  Lymphadenopathy:    She has no cervical adenopathy.  Skin: No rash noted. No erythema.  Healing incision site - anterior neck.    Psychiatric: She has a normal mood and affect. Her behavior is normal.    BP 118/70 mmHg  Pulse 90  Temp(Src) 98.3 F (36.8 C) (Oral)  Resp 18  Ht 5\' 6"  (1.676 m)  Wt 221 lb 8 oz (100.472 kg)  BMI 35.77 kg/m2  SpO2 98%  LMP 11/06/2000 Wt Readings from Last 3 Encounters:  06/04/15 221 lb 8 oz (100.472 kg)  05/27/15 205 lb (92.987 kg)  05/18/15 205 lb (92.987 kg)     Lab Results  Component Value Date   WBC 5.3 04/30/2015   HGB 13.9 05/27/2015   HCT 41.0 05/27/2015   PLT 423 04/30/2015   GLUCOSE 87 05/29/2015   CHOL 223* 05/29/2015   TRIG 92.0 05/29/2015   HDL 77.00 05/29/2015   LDLDIRECT 164.5 07/24/2013   LDLCALC 128* 05/29/2015   ALT 13 05/29/2015   AST 11 05/29/2015   NA 142 05/29/2015   K 3.9 05/29/2015   CL 103 05/29/2015   CREATININE 0.80 05/29/2015   BUN 11 05/29/2015   CO2 32 05/29/2015   TSH 1.64 05/29/2015   INR 1.0 04/21/2014       Assessment & Plan:   Problem List Items Addressed This Visit    Esophageal reflux    On omeprazole.  Controlled symptoms.       Essential hypertension, benign - Primary    Blood pressure under good control.  Continue same medication regimen.  Follow pressures.  Follow metabolic panel.        Relevant Orders   Basic metabolic panel   MGUS (monoclonal gammopathy of unknown significance)    Seeing hematology - Dr Ma Hillock.  Stable.        Pure hypercholesterolemia    Low cholesterol diet and exercise.  Follow lipid panel.   Lab Results  Component Value Date   CHOL 223* 05/29/2015   HDL 77.00 05/29/2015   LDLCALC 128* 05/29/2015   LDLDIRECT 164.5 07/24/2013   TRIG 92.0 05/29/2015   CHOLHDL 3 05/29/2015         Relevant Orders   Hepatic function panel   Lipid panel   Thrombocytosis ([place])    04/30/15 - platelet count wnl.        Thyroglossal duct cyst    S/p removal.  Doing well.  Followed by ENT.            Einar Pheasant, MD

## 2015-06-04 NOTE — Progress Notes (Signed)
Pre-visit discussion using our clinic review tool. No additional management support is needed unless otherwise documented below in the visit note.  

## 2015-06-07 ENCOUNTER — Encounter: Payer: Self-pay | Admitting: Internal Medicine

## 2015-06-07 DIAGNOSIS — Q892 Congenital malformations of other endocrine glands: Secondary | ICD-10-CM | POA: Insufficient documentation

## 2015-06-07 NOTE — Assessment & Plan Note (Signed)
Blood pressure under good control.  Continue same medication regimen.  Follow pressures.  Follow metabolic panel.   

## 2015-06-07 NOTE — Assessment & Plan Note (Signed)
On omeprazole.  Controlled symptoms.

## 2015-06-07 NOTE — Assessment & Plan Note (Signed)
Seeing hematology - Dr Ma Hillock.  Stable.

## 2015-06-07 NOTE — Assessment & Plan Note (Addendum)
Low cholesterol diet and exercise.  Follow lipid panel.   Lab Results  Component Value Date   CHOL 223* 05/29/2015   HDL 77.00 05/29/2015   LDLCALC 128* 05/29/2015   LDLDIRECT 164.5 07/24/2013   TRIG 92.0 05/29/2015   CHOLHDL 3 05/29/2015

## 2015-06-07 NOTE — Assessment & Plan Note (Signed)
S/p removal.  Doing well.  Followed by ENT.

## 2015-06-07 NOTE — Assessment & Plan Note (Signed)
04/30/15 - platelet count wnl.

## 2015-07-06 ENCOUNTER — Other Ambulatory Visit: Payer: Self-pay | Admitting: Internal Medicine

## 2015-08-17 ENCOUNTER — Other Ambulatory Visit (INDEPENDENT_AMBULATORY_CARE_PROVIDER_SITE_OTHER): Payer: BC Managed Care – PPO

## 2015-08-17 ENCOUNTER — Encounter: Payer: Self-pay | Admitting: Internal Medicine

## 2015-08-17 DIAGNOSIS — E7800 Pure hypercholesterolemia, unspecified: Secondary | ICD-10-CM | POA: Diagnosis not present

## 2015-08-17 DIAGNOSIS — I10 Essential (primary) hypertension: Secondary | ICD-10-CM

## 2015-08-17 LAB — BASIC METABOLIC PANEL
BUN: 11 mg/dL (ref 6–23)
CHLORIDE: 104 meq/L (ref 96–112)
CO2: 28 meq/L (ref 19–32)
CREATININE: 0.73 mg/dL (ref 0.40–1.20)
Calcium: 9.9 mg/dL (ref 8.4–10.5)
GFR: 107.99 mL/min (ref >60.00)
GLUCOSE: 94 mg/dL (ref 70–99)
Potassium: 3.6 mEq/L (ref 3.5–5.1)
Sodium: 140 mEq/L (ref 135–145)

## 2015-08-17 LAB — LIPID PANEL
CHOLESTEROL: 211 mg/dL — AB (ref 0–200)
HDL: 64.1 mg/dL (ref >39.00)
LDL Cholesterol: 133 mg/dL — ABNORMAL HIGH (ref 0–99)
NONHDL: 146.66
TRIGLYCERIDES: 66 mg/dL (ref 0.0–149.0)
Total CHOL/HDL Ratio: 3
VLDL: 13.2 mg/dL (ref 0.0–40.0)

## 2015-08-17 LAB — HEPATIC FUNCTION PANEL
ALBUMIN: 4.1 g/dL (ref 3.5–5.2)
ALK PHOS: 70 U/L (ref 39–117)
ALT: 14 U/L (ref 0–35)
AST: 14 U/L (ref 0–37)
Bilirubin, Direct: 0.1 mg/dL (ref 0.0–0.3)
TOTAL PROTEIN: 7.5 g/dL (ref 6.0–8.3)
Total Bilirubin: 0.6 mg/dL (ref 0.2–1.2)

## 2015-08-21 ENCOUNTER — Encounter: Payer: BC Managed Care – PPO | Admitting: Internal Medicine

## 2015-09-07 ENCOUNTER — Other Ambulatory Visit: Payer: BC Managed Care – PPO

## 2015-09-10 ENCOUNTER — Encounter: Payer: Self-pay | Admitting: Internal Medicine

## 2015-09-10 ENCOUNTER — Encounter: Payer: BC Managed Care – PPO | Admitting: Internal Medicine

## 2015-09-10 ENCOUNTER — Ambulatory Visit (INDEPENDENT_AMBULATORY_CARE_PROVIDER_SITE_OTHER): Payer: BC Managed Care – PPO | Admitting: Internal Medicine

## 2015-09-10 VITALS — BP 118/70 | HR 91 | Temp 98.7°F | Resp 12 | Ht 66.0 in | Wt 208.8 lb

## 2015-09-10 DIAGNOSIS — D472 Monoclonal gammopathy: Secondary | ICD-10-CM | POA: Diagnosis not present

## 2015-09-10 DIAGNOSIS — Z0000 Encounter for general adult medical examination without abnormal findings: Secondary | ICD-10-CM

## 2015-09-10 DIAGNOSIS — Z9109 Other allergy status, other than to drugs and biological substances: Secondary | ICD-10-CM

## 2015-09-10 DIAGNOSIS — I10 Essential (primary) hypertension: Secondary | ICD-10-CM

## 2015-09-10 DIAGNOSIS — Z91048 Other nonmedicinal substance allergy status: Secondary | ICD-10-CM

## 2015-09-10 DIAGNOSIS — E7800 Pure hypercholesterolemia, unspecified: Secondary | ICD-10-CM | POA: Diagnosis not present

## 2015-09-10 NOTE — Progress Notes (Signed)
Patient ID: Kathleen Gould, female   DOB: Apr 04, 1966, 50 y.o.   MRN: [id number]   Subjective:    Patient ID: Kathleen Gould, female    DOB: October 04, 1965, 50 y.o.   MRN: [id number]  HPI  Patient with past history of hypercholesterolemia, GERD, hypertension and MGUS.  She comes in today to follow up on these issues as well as for a complete physical exam.  She is doing well.  School is going well.  Still with increased stress, but seems better.  Handling things well.  Tries to stay active.  No cardiac symptoms with increased activity or exertion.  No sob.  No abdominal pain or cramping.  Bowels stable.  Following cholesterol.     Past Medical History  Diagnosis Date  . Hypertension   . Endometriosis   . Hypercholesterolemia   . Uterine fibroid   . Environmental allergies   . GERD (gastroesophageal reflux disease)   . Arthritis    Past Surgical History  Procedure Laterality Date  . Oophorectomy      S/P left secondary to cyst  . Cesarean section      x2  . Abdominal hysterectomy    . Thyroglossal duct cyst N/A 05/27/2015    Procedure: THYROGLOSSAL DUCT CYST EXCISION ;  Surgeon: Clyde Canterbury, MD;  Location: [facility] ORS;  Service: ENT;  Laterality: N/A;   Family History  Problem Relation Age of Onset  . Heart attack Father 20  . Heart disease Father     bypass surgery  . Hypertension Father   . Hypercholesterolemia Father   . Kidney disease Father   . Diabetes      aunt  . Diabetes Mother    Social History   Social History  . Marital Status: Married    Spouse Name: N/A  . Number of Children: 2  . Years of Education: N/A   Social History Main Topics  . Smoking status: Never Smoker   . Smokeless tobacco: Never Used  . Alcohol Use: No  . Drug Use: No  . Sexual Activity: Not Asked   Other Topics Concern  . None   Social History Narrative    Outpatient Encounter Prescriptions as of 09/10/2015  Medication Sig  . atorvastatin (LIPITOR) 10 MG tablet Take 1 tablet  (10 mg total) by mouth daily. (Patient taking differently: Take 10 mg by mouth daily at 6 PM. )  . clindamycin (CLEOCIN) 300 MG capsule Take 1 capsule (300 mg total) by mouth 4 (four) times daily.  . felodipine (PLENDIL) 5 MG 24 hr tablet TAKE 1 TABLET (5 MG TOTAL) BY MOUTH DAILY.  . fluticasone (FLONASE) 50 MCG/ACT nasal spray Place 2 sprays into both nostrils daily.  [name] Take 1 capsule by mouth daily.   . potassium chloride (K-DUR) 10 MEQ tablet Take one tablet twice a day for two days and then take one tablet a day.  . triamterene-hydrochlorothiazide (MAXZIDE-25) 37.5-25 MG tablet TAKE 0.5 TABLETS BY MOUTH DAILY.   No facility-administered encounter medications on file as of 09/10/2015.    Review of Systems  Constitutional: Negative for appetite change and unexpected weight change.  HENT: Negative for congestion and sinus pressure.   Eyes: Negative for discharge and redness.  Respiratory: Negative for cough, chest tightness and shortness of breath.   Cardiovascular: Negative for chest pain, palpitations and leg swelling.  Gastrointestinal: Negative for nausea, vomiting, abdominal pain and diarrhea.  Genitourinary: Negative for dysuria and difficulty urinating.  Musculoskeletal:  Negative for back pain and joint swelling.  Skin: Negative for color change and rash.  Neurological: Negative for dizziness, light-headedness and headaches.  Psychiatric/Behavioral: Negative for dysphoric mood and agitation.       Objective:    Physical Exam  Constitutional: She is oriented to person, place, and time. She appears well-developed and well-nourished. No distress.  HENT:  Nose: Nose normal.  Mouth/Throat: Oropharynx is clear and moist.  Eyes: Conjunctivae are normal. Right eye exhibits no discharge. Left eye exhibits no discharge. No scleral icterus.  Neck: Neck supple. No thyromegaly present.  Cardiovascular: Normal rate and regular rhythm.     Pulmonary/Chest: Breath sounds normal. No accessory muscle usage. No tachypnea. No respiratory distress. She has no decreased breath sounds. She has no wheezes. She has no rhonchi. Right breast exhibits no inverted nipple, no mass, no nipple discharge and no tenderness (no axillary adenopathy). Left breast exhibits no inverted nipple, no mass, no nipple discharge and no tenderness (no axilarry adenopathy).  Abdominal: Soft. Bowel sounds are normal. There is no tenderness.  Musculoskeletal: She exhibits no edema or tenderness.  Lymphadenopathy:    She has no cervical adenopathy.  Neurological: She is alert and oriented to person, place, and time.  Skin: Skin is warm. No rash noted. No erythema.  Psychiatric: She has a normal mood and affect. Her behavior is normal.    BP 118/70 mmHg  Pulse 91  Temp(Src) 98.7 F (37.1 C) (Oral)  Resp 12  Ht 5\' 6"  (1.676 m)  Wt 208 lb 12.8 oz (94.711 kg)  BMI 33.72 kg/m2  SpO2 98%  LMP 11/06/2000 Wt Readings from Last 3 Encounters:  09/10/15 208 lb 12.8 oz (94.711 kg)  06/04/15 221 lb 8 oz (100.472 kg)  05/27/15 205 lb (92.987 kg)     Lab Results  Component Value Date   WBC 5.3 04/30/2015   HGB 13.9 05/27/2015   HCT 41.0 05/27/2015   PLT 423 04/30/2015   GLUCOSE 94 08/17/2015   CHOL 211* 08/17/2015   TRIG 66.0 08/17/2015   HDL 64.10 08/17/2015   LDLDIRECT 164.5 07/24/2013   LDLCALC 133* 08/17/2015   ALT 14 08/17/2015   AST 14 08/17/2015   NA 140 08/17/2015   K 3.6 08/17/2015   CL 104 08/17/2015   CREATININE 0.73 08/17/2015   BUN 11 08/17/2015   CO2 28 08/17/2015   TSH 1.64 05/29/2015   INR 1.0 04/21/2014       Assessment & Plan:   Problem List Items Addressed This Visit    Environmental allergies    Previously saw ENT.  Doing well.       Essential hypertension, benign    Blood pressure under good control.  Continue same medication regimen.  Follow pressures.  Follow metabolic panel.        Relevant Orders   Basic  metabolic panel   Health care maintenance    Physical today 09/10/15.  Colonoscopy 02/2008.  Mammogram 11/04/14 - Birads I.        MGUS (monoclonal gammopathy of unknown significance)    Seeing hematology.  Stable.        Pure hypercholesterolemia    Low cholesterol diet and exercise.  Follow lipid panel.  On lipitor.        Relevant Orders   Lipid panel   Hepatic function panel    Other Visit Diagnoses    Routine general medical examination at a health care facility    -  Primary  Einar Pheasant, MD

## 2015-09-10 NOTE — Progress Notes (Signed)
Pre visit review using our clinic review tool, if applicable. No additional management support is needed unless otherwise documented below in the visit note. 

## 2015-09-12 ENCOUNTER — Other Ambulatory Visit: Payer: Self-pay | Admitting: Internal Medicine

## 2015-09-13 ENCOUNTER — Encounter: Payer: Self-pay | Admitting: Internal Medicine

## 2015-09-13 NOTE — Assessment & Plan Note (Signed)
Seeing hematology.  Stable.   

## 2015-09-13 NOTE — Assessment & Plan Note (Signed)
Physical today 09/10/15.  Colonoscopy 02/2008.  Mammogram 11/04/14 - Birads I.

## 2015-09-13 NOTE — Assessment & Plan Note (Signed)
Low cholesterol diet and exercise.  Follow lipid panel.  On lipitor.   

## 2015-09-13 NOTE — Assessment & Plan Note (Signed)
Blood pressure under good control.  Continue same medication regimen.  Follow pressures.  Follow metabolic panel.   

## 2015-09-13 NOTE — Assessment & Plan Note (Signed)
Previously saw ENT.  Doing well.

## 2015-09-21 ENCOUNTER — Ambulatory Visit (INDEPENDENT_AMBULATORY_CARE_PROVIDER_SITE_OTHER): Payer: BC Managed Care – PPO | Admitting: Family Medicine

## 2015-09-21 ENCOUNTER — Encounter: Payer: Self-pay | Admitting: Family Medicine

## 2015-09-21 ENCOUNTER — Encounter: Payer: Self-pay | Admitting: Internal Medicine

## 2015-09-21 VITALS — BP 122/84 | HR 100 | Temp 98.2°F | Ht 66.0 in | Wt 223.5 lb

## 2015-09-21 DIAGNOSIS — J988 Other specified respiratory disorders: Principal | ICD-10-CM

## 2015-09-21 DIAGNOSIS — B349 Viral infection, unspecified: Secondary | ICD-10-CM

## 2015-09-21 DIAGNOSIS — B9789 Other viral agents as the cause of diseases classified elsewhere: Secondary | ICD-10-CM

## 2015-09-21 MED ORDER — PREDNISONE 50 MG PO TABS: ORAL_TABLET | ORAL | Status: DC

## 2015-09-21 MED ORDER — HYDROCOD POLST-CPM POLST ER 10-8 MG/5ML PO SUER: 5.0000 mL | Freq: Two times a day (BID) | ORAL | Status: DC | PRN

## 2015-09-21 NOTE — Progress Notes (Signed)
Subjective:  Patient ID: Kathleen Gould, female    DOB: 05-Jan-1966  Age: 50 y.o. MRN: [id number]  CC: Cough, congestion, Sore throat  HPI:  50 year old female presents today with the above complaints.  Patient states that she's been sick for the past 5 days. She was using dry cough, congestion, sore throat, and sinus pressure. Cough is rarely productive. She's been taking Robitussin and Mucinex with no improvement. No associated fever. 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 No exacerbating or relieving factors.  Social Hx   Social History   Social History  . Marital Status: Married    Spouse Name: N/A  . Number of Children: 2  . Years of Education: N/A   Social History Main Topics  . Smoking status: Never Smoker   . Smokeless tobacco: Never Used  . Alcohol Use: No  . Drug Use: No  . Sexual Activity: Not Asked   Other Topics Concern  . None   Social History Narrative   Review of Systems  Constitutional: Negative for fever.  HENT: Positive for sinus pressure and sore throat.   Respiratory: Positive for cough.    Objective:  BP 122/84 mmHg  Pulse 100  Temp(Src) 98.2 F (36.8 C) (Oral)  Ht 5\' 6"  (1.676 m)  Wt 223 lb 8 oz (101.379 kg)  BMI 36.09 kg/m2  SpO2 98%  LMP 11/06/2000  BP/Weight 09/21/2015 09/10/2015 [id number]  Systolic BP 123XX123 [id number] [id number]  Diastolic BP 84 70 70  Wt. (Lbs) 223.5 208.8 221.5  BMI 36.09 33.72 35.77   Physical Exam  Constitutional: She is oriented to person, place, and time. She appears well-developed. No distress.  HENT:  Head: Normocephalic and atraumatic.  Oropharynx with mild erythema. Normal TMs bilaterally. Maxillary sinus tenderness to palpation.  Neck: Neck supple.  Cardiovascular: Regular rhythm.  Tachycardia present.   Pulmonary/Chest: Effort normal and breath sounds normal. No respiratory distress. She has no wheezes. She has no rales.  Lymphadenopathy:    She has no cervical adenopathy.    Neurological: She is alert and oriented to person, place, and time.  Psychiatric: She has a normal mood and affect.  Vitals reviewed.  Lab Results  Component Value Date   WBC 5.3 04/30/2015   HGB 13.9 05/27/2015   HCT 41.0 05/27/2015   PLT 423 04/30/2015   GLUCOSE 94 08/17/2015   CHOL 211* 08/17/2015   TRIG 66.0 08/17/2015   HDL 64.10 08/17/2015   LDLDIRECT 164.5 07/24/2013   LDLCALC 133* 08/17/2015   ALT 14 08/17/2015   AST 14 08/17/2015   NA 140 08/17/2015   K 3.6 08/17/2015   CL 104 08/17/2015   CREATININE 0.73 08/17/2015   BUN 11 08/17/2015   CO2 28 08/17/2015   TSH 1.64 05/29/2015   INR 1.0 04/21/2014    Assessment & Plan:   Problem List Items Addressed This Visit    Viral respiratory infection - Primary    Exam unremarkable. Treating with supportive care and prednisone. Tussionex for cough.         Meds ordered this encounter  Medications  . predniSONE (DELTASONE) 50 MG tablet    Sig: 1 tablet daily x 5 days.    Dispense:  5 tablet    Refill:  0  . chlorpheniramine-HYDROcodone (TUSSIONEX PENNKINETIC ER) 10-8 MG/5ML SUER    Sig: Take 5 mLs by mouth every 12 (twelve) hours as needed.    Dispense:  115 mL    Refill:  0  Follow-up: PRN  [place]

## 2015-09-21 NOTE — Progress Notes (Signed)
Pre visit review using our clinic review tool, if applicable. No additional management support is needed unless otherwise documented below in the visit note. 

## 2015-09-21 NOTE — Patient Instructions (Signed)
This is viral and will slowly improve.  I have prescribed tussionex and prednisone to help with your symptoms.  Follow up as needed.  Take care  Dr. Lacinda Axon

## 2015-09-21 NOTE — Assessment & Plan Note (Signed)
Exam unremarkable. Treating with supportive care and prednisone. Tussionex for cough.

## 2015-09-29 ENCOUNTER — Telehealth: Payer: Self-pay | Admitting: Internal Medicine

## 2015-09-29 DIAGNOSIS — Z1239 Encounter for other screening for malignant neoplasm of breast: Secondary | ICD-10-CM

## 2015-09-29 NOTE — Telephone Encounter (Signed)
Order placed for mammogram  Please schedule.   Thanks

## 2015-09-29 NOTE — Telephone Encounter (Signed)
Pt request to mammogram done at [facility]. Please order/msn

## 2015-10-27 ENCOUNTER — Other Ambulatory Visit: Payer: Self-pay | Admitting: *Deleted

## 2015-10-27 DIAGNOSIS — D472 Monoclonal gammopathy: Secondary | ICD-10-CM

## 2015-10-29 ENCOUNTER — Inpatient Hospital Stay: Payer: BC Managed Care – PPO | Attending: Internal Medicine

## 2015-10-29 DIAGNOSIS — D472 Monoclonal gammopathy: Secondary | ICD-10-CM

## 2015-10-29 DIAGNOSIS — Z79899 Other long term (current) drug therapy: Secondary | ICD-10-CM | POA: Diagnosis not present

## 2015-10-29 LAB — CBC WITH DIFFERENTIAL/PLATELET
BASOS PCT: 0 %
Basophils Absolute: 0 10*3/uL (ref 0–0.1)
EOS PCT: 2 %
Eosinophils Absolute: 0.1 10*3/uL (ref 0–0.7)
HCT: 42.1 % (ref 35.0–47.0)
Hemoglobin: 13.9 g/dL (ref 12.0–16.0)
Lymphocytes Relative: 31 %
Lymphs Abs: 1.9 10*3/uL (ref 1.0–3.6)
MCH: 28.1 pg (ref 26.0–34.0)
MCHC: 33 g/dL (ref 32.0–36.0)
MCV: 85 fL (ref 80.0–100.0)
MONO ABS: 0.3 10*3/uL (ref 0.2–0.9)
Monocytes Relative: 5 %
NEUTROS ABS: 3.9 10*3/uL (ref 1.4–6.5)
NEUTROS PCT: 62 %
PLATELETS: 445 10*3/uL — AB (ref 150–440)
RBC: 4.95 MIL/uL (ref 3.80–5.20)
RDW: 13.8 % (ref 11.5–14.5)
WBC: 6.3 10*3/uL (ref 3.6–11.0)

## 2015-10-29 LAB — CREATININE, SERUM: Creatinine, Ser: 0.7 mg/dL (ref 0.44–1.00)

## 2015-10-30 LAB — PROTEIN ELECTROPHORESIS, SERUM
A/G RATIO SPE: 1 (ref 0.7–1.7)
Albumin ELP: 3.8 g/dL (ref 2.9–4.4)
Alpha-1-Globulin: 0.3 g/dL (ref 0.0–0.4)
Alpha-2-Globulin: 0.8 g/dL (ref 0.4–1.0)
Beta Globulin: 1.4 g/dL — ABNORMAL HIGH (ref 0.7–1.3)
GLOBULIN, TOTAL: 3.7 g/dL (ref 2.2–3.9)
Gamma Globulin: 1.3 g/dL (ref 0.4–1.8)
M-Spike, %: 0.3 g/dL — ABNORMAL HIGH
TOTAL PROTEIN ELP: 7.5 g/dL (ref 6.0–8.5)

## 2015-10-30 LAB — KAPPA/LAMBDA LIGHT CHAINS
KAPPA, LAMDA LIGHT CHAIN RATIO: 1.21 (ref 0.26–1.65)
Kappa free light chain: 15.63 mg/L (ref 3.30–19.40)
LAMDA FREE LIGHT CHAINS: 12.95 mg/L (ref 5.71–26.30)

## 2015-10-30 LAB — CALCIUM, IONIZED: CALCIUM, IONIZED, SERUM: 5.4 mg/dL (ref 4.5–5.6)

## 2015-11-04 ENCOUNTER — Telehealth: Payer: Self-pay | Admitting: *Deleted

## 2015-11-04 ENCOUNTER — Other Ambulatory Visit: Payer: Self-pay | Admitting: Internal Medicine

## 2015-11-04 NOTE — Telephone Encounter (Signed)
Pt wants her blood work from last week.  Called and let pt know that plt are a little elevated and they have been from time to time but nothing concerning. Her m spike was 0.3 and it was the same last time which is a stable number.

## 2015-11-05 ENCOUNTER — Ambulatory Visit
Admission: RE | Admit: 2015-11-05 | Discharge: 2015-11-05 | Disposition: A | Discharge status: Home / Self Care | Payer: BC Managed Care – PPO | Source: Ambulatory Visit | Attending: Internal Medicine | Admitting: Internal Medicine

## 2015-11-05 DIAGNOSIS — Z1231 Encounter for screening mammogram for malignant neoplasm of breast: Secondary | ICD-10-CM | POA: Diagnosis not present

## 2015-11-05 DIAGNOSIS — Z1239 Encounter for other screening for malignant neoplasm of breast: Secondary | ICD-10-CM

## 2016-01-04 ENCOUNTER — Other Ambulatory Visit: Payer: Self-pay

## 2016-01-04 MED ORDER — ATORVASTATIN CALCIUM 10 MG PO TABS: ORAL_TABLET | ORAL | Status: DC

## 2016-01-05 ENCOUNTER — Other Ambulatory Visit (INDEPENDENT_AMBULATORY_CARE_PROVIDER_SITE_OTHER): Payer: BC Managed Care – PPO

## 2016-01-05 DIAGNOSIS — E7800 Pure hypercholesterolemia, unspecified: Secondary | ICD-10-CM | POA: Diagnosis not present

## 2016-01-05 DIAGNOSIS — I10 Essential (primary) hypertension: Secondary | ICD-10-CM

## 2016-01-05 LAB — LIPID PANEL
CHOLESTEROL: 229 mg/dL — AB (ref 0–200)
HDL: 75.8 mg/dL (ref >39.00)
LDL CALC: 138 mg/dL — AB (ref 0–99)
NonHDL: 152.84
TRIGLYCERIDES: 76 mg/dL (ref 0.0–149.0)
Total CHOL/HDL Ratio: 3
VLDL: 15.2 mg/dL (ref 0.0–40.0)

## 2016-01-05 LAB — BASIC METABOLIC PANEL
BUN: 10 mg/dL (ref 6–23)
CO2: 31 mEq/L (ref 19–32)
Calcium: 10.4 mg/dL (ref 8.4–10.5)
Chloride: 101 mEq/L (ref 96–112)
Creatinine, Ser: 0.73 mg/dL (ref 0.40–1.20)
GFR: 107.82 mL/min (ref >60.00)
Glucose, Bld: 103 mg/dL — ABNORMAL HIGH (ref 70–99)
POTASSIUM: 3.9 meq/L (ref 3.5–5.1)
SODIUM: 141 meq/L (ref 135–145)

## 2016-01-05 LAB — HEPATIC FUNCTION PANEL
ALT: 16 U/L (ref 0–35)
AST: 15 U/L (ref 0–37)
Albumin: 4.7 g/dL (ref 3.5–5.2)
Alkaline Phosphatase: 74 U/L (ref 39–117)
Bilirubin, Direct: 0.1 mg/dL (ref 0.0–0.3)
TOTAL PROTEIN: 7.8 g/dL (ref 6.0–8.3)
Total Bilirubin: 0.7 mg/dL (ref 0.2–1.2)

## 2016-01-06 ENCOUNTER — Encounter: Payer: Self-pay | Admitting: Internal Medicine

## 2016-01-08 ENCOUNTER — Encounter: Payer: Self-pay | Admitting: Internal Medicine

## 2016-01-08 ENCOUNTER — Ambulatory Visit (INDEPENDENT_AMBULATORY_CARE_PROVIDER_SITE_OTHER): Payer: BC Managed Care – PPO | Admitting: Internal Medicine

## 2016-01-08 VITALS — BP 110/68 | HR 90 | Temp 98.5°F | Resp 14 | Ht 66.0 in | Wt 223.0 lb

## 2016-01-08 DIAGNOSIS — I10 Essential (primary) hypertension: Secondary | ICD-10-CM | POA: Diagnosis not present

## 2016-01-08 DIAGNOSIS — D473 Essential (hemorrhagic) thrombocythemia: Secondary | ICD-10-CM

## 2016-01-08 DIAGNOSIS — D472 Monoclonal gammopathy: Secondary | ICD-10-CM | POA: Diagnosis not present

## 2016-01-08 DIAGNOSIS — D75839 Thrombocytosis, unspecified: Secondary | ICD-10-CM

## 2016-01-08 DIAGNOSIS — E7800 Pure hypercholesterolemia, unspecified: Secondary | ICD-10-CM

## 2016-01-08 DIAGNOSIS — K219 Gastro-esophageal reflux disease without esophagitis: Secondary | ICD-10-CM

## 2016-01-08 MED ORDER — FELODIPINE ER 5 MG PO TB24: ORAL_TABLET | ORAL | Status: DC

## 2016-01-08 MED ORDER — ATORVASTATIN CALCIUM 20 MG PO TABS: 20.0000 mg | ORAL_TABLET | Freq: Every day | ORAL | Status: DC

## 2016-01-08 NOTE — Progress Notes (Signed)
Patient ID: Kathleen Gould, female   DOB: 1966/02/25, 50 y.o.   MRN: [id number]   Subjective:    Patient ID: Kathleen Gould, female    DOB: September 26, 1965, 50 y.o.   MRN: [id number]  HPI  Patient here for a scheduled follow up.  She states she is doing relatively well.  Increased stress with her sister's health issues.  Also stress with her health as well.   She feels she is handling things relatively well.  No chest pain or tightness.  No sob.  No acid reflux.  No abdominal pain or cramping.  Bowels stable.  Some foot swelling.  No pain.    Past Medical History  Diagnosis Date  . Hypertension   . Endometriosis   . Hypercholesterolemia   . Uterine fibroid   . Environmental allergies   . GERD (gastroesophageal reflux disease)   . Arthritis    Past Surgical History  Procedure Laterality Date  . Oophorectomy      S/P left secondary to cyst  . Cesarean section      x2  . Abdominal hysterectomy    . Thyroglossal duct cyst N/A 05/27/2015    Procedure: THYROGLOSSAL DUCT CYST EXCISION ;  Surgeon: Clyde Canterbury, MD;  Location: [facility] ORS;  Service: ENT;  Laterality: N/A;   Family History  Problem Relation Age of Onset  . Heart attack Father 20  . Heart disease Father     bypass surgery  . Hypertension Father   . Hypercholesterolemia Father   . Kidney disease Father   . Diabetes      aunt  . Diabetes Mother   . Breast cancer Neg Hx    Social History   Social History  . Marital Status: Married    Spouse Name: N/A  . Number of Children: 2  . Years of Education: N/A   Social History Main Topics  . Smoking status: Never Smoker   . Smokeless tobacco: Never Used  . Alcohol Use: No  . Drug Use: No  . Sexual Activity: Not Asked   Other Topics Concern  . None   Social History Narrative    Outpatient Encounter Prescriptions as of 01/08/2016  Medication Sig  . chlorpheniramine-HYDROcodone (TUSSIONEX PENNKINETIC ER) 10-8 MG/5ML SUER Take 5 mLs by mouth every 12 (twelve)  hours as needed.  . clindamycin (CLEOCIN) 300 MG capsule Take 1 capsule (300 mg total) by mouth 4 (four) times daily.  . felodipine (PLENDIL) 5 MG 24 hr tablet TAKE 1 TABLET (5 MG TOTAL) BY MOUTH DAILY.  . fluticasone (FLONASE) 50 MCG/ACT nasal spray Place 2 sprays into both nostrils daily.  [name] Take 1 capsule by mouth daily.   . potassium chloride (K-DUR) 10 MEQ tablet Take one tablet twice a day for two days and then take one tablet a day.  . predniSONE (DELTASONE) 50 MG tablet 1 tablet daily x 5 days.  Marland Kitchen triamterene-hydrochlorothiazide (MAXZIDE-25) 37.5-25 MG tablet TAKE 0.5 TABLETS BY MOUTH DAILY.  . [DISCONTINUED] atorvastatin (LIPITOR) 10 MG tablet TAKE 1 TABLET (10 MG TOTAL) BY MOUTH DAILY.  . [DISCONTINUED] felodipine (PLENDIL) 5 MG 24 hr tablet TAKE 1 TABLET (5 MG TOTAL) BY MOUTH DAILY.  Marland Kitchen atorvastatin (LIPITOR) 20 MG tablet Take 1 tablet (20 mg total) by mouth daily.   No facility-administered encounter medications on file as of 01/08/2016.    Review of Systems  Constitutional: Negative for appetite change and unexpected weight change.  HENT: Negative for congestion and  sinus pressure.   Respiratory: Negative for cough, chest tightness and shortness of breath.   Cardiovascular: Negative for chest pain, palpitations and leg swelling.  Gastrointestinal: Negative for nausea, vomiting, abdominal pain and diarrhea.  Genitourinary: Negative for dysuria and difficulty urinating.  Musculoskeletal: Negative for myalgias and back pain.  Skin: Negative for color change and rash.  Neurological: Negative for dizziness, light-headedness and headaches.  Psychiatric/Behavioral: Negative for dysphoric mood and agitation.       Objective:    Physical Exam  Constitutional: She appears well-developed and well-nourished. No distress.  HENT:  Nose: Nose normal.  Mouth/Throat: Oropharynx is clear and moist.  Neck: Neck supple. No thyromegaly present.   Cardiovascular: Normal rate and regular rhythm.   Pulmonary/Chest: Breath sounds normal. No respiratory distress. She has no wheezes.  Abdominal: Soft. Bowel sounds are normal. There is no tenderness.  Musculoskeletal: She exhibits no tenderness.  Minimal pedal edema.    Lymphadenopathy:    She has no cervical adenopathy.  Skin: No rash noted. No erythema.  Psychiatric: She has a normal mood and affect. Her behavior is normal.    BP 110/68 mmHg  Pulse 90  Temp(Src) 98.5 F (36.9 C) (Oral)  Resp 14  Ht 5\' 6"  (1.676 m)  Wt 223 lb (101.152 kg)  BMI 36.01 kg/m2  SpO2 99%  LMP 11/06/2000 Wt Readings from Last 3 Encounters:  01/08/16 223 lb (101.152 kg)  09/21/15 223 lb 8 oz (101.379 kg)  09/10/15 208 lb 12.8 oz (94.711 kg)     Lab Results  Component Value Date   WBC 6.3 10/29/2015   HGB 13.9 10/29/2015   HCT 42.1 10/29/2015   PLT 445* 10/29/2015   GLUCOSE 103* 01/05/2016   CHOL 229* 01/05/2016   TRIG 76.0 01/05/2016   HDL 75.80 01/05/2016   LDLDIRECT 164.5 07/24/2013   LDLCALC 138* 01/05/2016   ALT 16 01/05/2016   AST 15 01/05/2016   NA 141 01/05/2016   K 3.9 01/05/2016   CL 101 01/05/2016   CREATININE 0.73 01/05/2016   BUN 10 01/05/2016   CO2 31 01/05/2016   TSH 1.64 05/29/2015   INR 1.0 04/21/2014    Mm Digital Screening Bilateral  11/06/2015  CLINICAL DATA:  Screening. EXAM: DIGITAL SCREENING BILATERAL MAMMOGRAM WITH CAD COMPARISON:  Previous exam(s). ACR Breast Density Category b: There are scattered areas of fibroglandular density. FINDINGS: There are no findings suspicious for malignancy. Images were processed with CAD. IMPRESSION: No mammographic evidence of malignancy. A result letter of this screening mammogram will be mailed directly to the patient. RECOMMENDATION: Screening mammogram in one year. (Code:SM-B-01Y) BI-RADS CATEGORY  1: Negative. Electronically Signed   By: Ammie Ferrier M.D.   On: 11/06/2015 08:09       Assessment & Plan:   Problem  List Items Addressed This Visit    Esophageal reflux    On zegerid.  Symptoms controlled.       Essential hypertension, benign    Blood pressure under good control.  Continue same medication regimen.  Follow pressures.  Follow metabolic panel.        Relevant Medications   atorvastatin (LIPITOR) 20 MG tablet   felodipine (PLENDIL) 5 MG 24 hr tablet   Other Relevant Orders   TSH   Basic metabolic panel   MGUS (monoclonal gammopathy of unknown significance)    Seeing hematology.  Stable.       Pure hypercholesterolemia    On lipitor.  Low cholesterol diet and exercise.  Follow lipid  panel and liver function tests.        Relevant Medications   atorvastatin (LIPITOR) 20 MG tablet   felodipine (PLENDIL) 5 MG 24 hr tablet   Other Relevant Orders   Lipid panel   Hepatic function panel   Thrombocytosis (HCC) - Primary    Follow cbc.           Einar Pheasant, MD

## 2016-01-08 NOTE — Progress Notes (Signed)
Pre visit review using our clinic review tool, if applicable. No additional management support is needed unless otherwise documented below in the visit note. 

## 2016-01-10 ENCOUNTER — Encounter: Payer: Self-pay | Admitting: Internal Medicine

## 2016-01-10 NOTE — Assessment & Plan Note (Signed)
On zegerid.  Symptoms controlled.

## 2016-01-10 NOTE — Assessment & Plan Note (Signed)
Follow cbc.  

## 2016-01-10 NOTE — Assessment & Plan Note (Signed)
On lipitor.  Low cholesterol diet and exercise.  Follow lipid panel and liver function tests.   

## 2016-01-10 NOTE — Assessment & Plan Note (Signed)
Blood pressure under good control.  Continue same medication regimen.  Follow pressures.  Follow metabolic panel.   

## 2016-01-10 NOTE — Assessment & Plan Note (Signed)
Seeing hematology.  Stable.   

## 2016-03-21 ENCOUNTER — Other Ambulatory Visit: Payer: Self-pay | Admitting: Internal Medicine

## 2016-03-28 ENCOUNTER — Encounter: Payer: Self-pay | Admitting: Family Medicine

## 2016-03-28 ENCOUNTER — Ambulatory Visit (INDEPENDENT_AMBULATORY_CARE_PROVIDER_SITE_OTHER): Payer: BC Managed Care – PPO | Admitting: Family Medicine

## 2016-03-28 VITALS — BP 116/76 | HR 88 | Temp 98.2°F | Ht 66.0 in | Wt 228.5 lb

## 2016-03-28 DIAGNOSIS — R0989 Other specified symptoms and signs involving the circulatory and respiratory systems: Secondary | ICD-10-CM

## 2016-03-28 DIAGNOSIS — R079 Chest pain, unspecified: Secondary | ICD-10-CM | POA: Insufficient documentation

## 2016-03-28 DIAGNOSIS — R0789 Other chest pain: Secondary | ICD-10-CM | POA: Diagnosis not present

## 2016-03-28 DIAGNOSIS — K219 Gastro-esophageal reflux disease without esophagitis: Secondary | ICD-10-CM

## 2016-03-28 NOTE — Progress Notes (Signed)
Pre visit review using our clinic review tool, if applicable. No additional management support is needed unless otherwise documented below in the visit note. 

## 2016-03-28 NOTE — Progress Notes (Signed)
Subjective:  Patient ID: Kathleen Gould, female    DOB: 1966-02-24  Age: 50 y.o. MRN: [id number]  CC: Congestion, Belching/reflux/chest pain  HPI:  50 year old female with hypertension, GERD, hyperlipidemia presents with the above complaints.  Patient reports that she's had chest congestion for the past 2 weeks. She reports chest tightness. No fevers or chills. Intermittent cough in attempts to get up sputum. No known relieving factors. No interventions or medications tried. No other associated symptoms.  Additionally, patient reports that she's had severe belching for the past 3 days. Patient does have a known history of reflux and is currently being treated with omeprazole. She's had no improvement with this treatment over the past 3 days. No reports of dysphagia, weight loss, or other red flags symptoms she does note some chest tightness/pain.   Social Hx   Social History   Social History  . Marital status: Married    Spouse name: N/A  . Number of children: 2  . Years of education: N/A   Social History Main Topics  . Smoking status: Never Smoker  . Smokeless tobacco: Never Used  . Alcohol use No  . Drug use: No  . Sexual activity: Not Asked   Other Topics Concern  . None   Social History Narrative  . None   Review of Systems  Respiratory: Positive for chest tightness.        Chest congestion.  Cardiovascular: Positive for chest pain.  Gastrointestinal:       Belching, GERD.   Objective:  BP 116/76   Pulse 88   Temp 98.2 F (36.8 C) (Oral)   Ht 5\' 6"  (1.676 m)   Wt 228 lb 8 oz (103.6 kg)   LMP 11/06/2000   SpO2 98%   BMI 36.88 kg/m   BP/Weight 03/28/2016 01/08/2016 [id number]  Systolic BP [id number]  Diastolic BP 76 68 84  Wt. (Lbs) 228.5 223 223.5  BMI 36.88 36.01 36.09   Physical Exam  Constitutional: She is oriented to person, place, and time. She appears well-developed. No distress.  HENT:  Mouth/Throat: Oropharynx is clear and moist.  Normal  TM's bilaterally.  Cardiovascular: Normal rate and regular rhythm.   Pulmonary/Chest: Effort normal. She has no wheezes. She has no rales. She exhibits tenderness.  Neurological: She is alert and oriented to person, place, and time.  Psychiatric: She has a normal mood and affect.  Vitals reviewed.  Lab Results  Component Value Date   WBC 6.3 10/29/2015   HGB 13.9 10/29/2015   HCT 42.1 10/29/2015   PLT 445 (H) 10/29/2015   GLUCOSE 103 (H) 01/05/2016   CHOL 229 (H) 01/05/2016   TRIG 76.0 01/05/2016   HDL 75.80 01/05/2016   LDLDIRECT 164.5 07/24/2013   LDLCALC 138 (H) 01/05/2016   ALT 16 01/05/2016   AST 15 01/05/2016   NA 141 01/05/2016   K 3.9 01/05/2016   CL 101 01/05/2016   CREATININE 0.73 01/05/2016   BUN 10 01/05/2016   CO2 31 01/05/2016   TSH 1.64 05/29/2015   INR 1.0 04/21/2014   Assessment & Plan:   Problem List Items Addressed This Visit    Esophageal reflux    Uncontrolled. Advised increase in omeprazole.       Chest pain - Primary    New problem. Given complaints and PMH/family hx EKG obtained today. Interpretation: Normal sinus rhythm with rate of 86. Normal axis. Normal intervals. T-wave inversion in lead 3. Mild ST segment elevation in V2 (<1  small box). Enlarged P waves consistent with atrial enlargement. Chest pain does not appear to be cardiac/ischemic in nature at this time. Likely from reflux/congestion.      Relevant Orders   EKG 12-Lead (Completed)   Chest congestion    New problem. Exam unremarkable. Supportive care.       Other Visit Diagnoses   None.    Follow-up: PRN  [place]

## 2016-03-28 NOTE — Assessment & Plan Note (Signed)
Uncontrolled. Advised increase in omeprazole.

## 2016-03-28 NOTE — Assessment & Plan Note (Signed)
New problem. Given complaints and PMH/family hx EKG obtained today. Interpretation: Normal sinus rhythm with rate of 86. Normal axis. Normal intervals. T-wave inversion in lead 3. Mild ST segment elevation in V2 (<1 small box). Enlarged P waves consistent with atrial enlargement. Chest pain does not appear to be cardiac/ischemic in nature at this time. Likely from reflux/congestion.

## 2016-03-28 NOTE — Assessment & Plan Note (Signed)
New problem. Exam unremarkable. Supportive care.

## 2016-03-28 NOTE — Patient Instructions (Signed)
Continue your current medications.  Your exam was normal.  Follow up closely with your PCP.  Take care  Dr. Lacinda Axon

## 2016-04-11 ENCOUNTER — Other Ambulatory Visit: Payer: Self-pay | Admitting: *Deleted

## 2016-04-11 DIAGNOSIS — D75839 Thrombocytosis, unspecified: Secondary | ICD-10-CM

## 2016-04-11 DIAGNOSIS — D473 Essential (hemorrhagic) thrombocythemia: Secondary | ICD-10-CM

## 2016-04-12 ENCOUNTER — Inpatient Hospital Stay (HOSPITAL_BASED_OUTPATIENT_CLINIC_OR_DEPARTMENT_OTHER): Payer: BC Managed Care – PPO | Admitting: Internal Medicine

## 2016-04-12 ENCOUNTER — Inpatient Hospital Stay: Payer: BC Managed Care – PPO | Attending: Internal Medicine

## 2016-04-12 DIAGNOSIS — K219 Gastro-esophageal reflux disease without esophagitis: Secondary | ICD-10-CM | POA: Diagnosis not present

## 2016-04-12 DIAGNOSIS — I10 Essential (primary) hypertension: Secondary | ICD-10-CM | POA: Diagnosis not present

## 2016-04-12 DIAGNOSIS — D75839 Thrombocytosis, unspecified: Secondary | ICD-10-CM

## 2016-04-12 DIAGNOSIS — Z79899 Other long term (current) drug therapy: Secondary | ICD-10-CM | POA: Insufficient documentation

## 2016-04-12 DIAGNOSIS — D472 Monoclonal gammopathy: Secondary | ICD-10-CM

## 2016-04-12 DIAGNOSIS — E7800 Pure hypercholesterolemia, unspecified: Secondary | ICD-10-CM | POA: Diagnosis not present

## 2016-04-12 DIAGNOSIS — M1990 Unspecified osteoarthritis, unspecified site: Secondary | ICD-10-CM | POA: Insufficient documentation

## 2016-04-12 DIAGNOSIS — D473 Essential (hemorrhagic) thrombocythemia: Secondary | ICD-10-CM

## 2016-04-12 LAB — CBC WITH DIFFERENTIAL/PLATELET
BASOS ABS: 0.1 10*3/uL (ref 0–0.1)
BASOS PCT: 1 %
EOS ABS: 0.1 10*3/uL (ref 0–0.7)
EOS PCT: 2 %
HCT: 40.3 % (ref 35.0–47.0)
Hemoglobin: 13.5 g/dL (ref 12.0–16.0)
LYMPHS PCT: 32 %
Lymphs Abs: 2.1 10*3/uL (ref 1.0–3.6)
MCH: 28.1 pg (ref 26.0–34.0)
MCHC: 33.4 g/dL (ref 32.0–36.0)
MCV: 83.9 fL (ref 80.0–100.0)
Monocytes Absolute: 0.5 10*3/uL (ref 0.2–0.9)
Monocytes Relative: 8 %
Neutro Abs: 3.6 10*3/uL (ref 1.4–6.5)
Neutrophils Relative %: 57 %
PLATELETS: 424 10*3/uL (ref 150–440)
RBC: 4.8 MIL/uL (ref 3.80–5.20)
RDW: 14.3 % (ref 11.5–14.5)
WBC: 6.4 10*3/uL (ref 3.6–11.0)

## 2016-04-12 LAB — COMPREHENSIVE METABOLIC PANEL
ALBUMIN: 4.3 g/dL (ref 3.5–5.0)
ALT: 21 U/L (ref 14–54)
AST: 18 U/L (ref 15–41)
Alkaline Phosphatase: 78 U/L (ref 38–126)
Anion gap: 7 (ref 5–15)
BUN: 11 mg/dL (ref 6–20)
CHLORIDE: 104 mmol/L (ref 101–111)
CO2: 29 mmol/L (ref 22–32)
CREATININE: 0.92 mg/dL (ref 0.44–1.00)
Calcium: 9.7 mg/dL (ref 8.9–10.3)
GFR calc Af Amer: >60 mL/min (ref >60)
GFR calc non Af Amer: >60 mL/min (ref >60)
GLUCOSE: 105 mg/dL — AB (ref 65–99)
Potassium: 4.1 mmol/L (ref 3.5–5.1)
SODIUM: 140 mmol/L (ref 135–145)
Total Bilirubin: 0.7 mg/dL (ref 0.3–1.2)
Total Protein: 7.9 g/dL (ref 6.5–8.1)

## 2016-04-12 NOTE — Progress Notes (Signed)
[place] OFFICE PROGRESS NOTE  Patient Care Team: Einar Pheasant, MD as PCP - General (Internal Medicine)  No matching staging information was found for the patient.  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 Serum LDH, Cr, CBC, Flow Cytometry, Skeletal survey all unremarkable.    No history exists.     INTERVAL HISTORY:  Kathleen Gould 50 y.o.  female pleasant patient above history of MGUS is here for follow-up. Patient denies any tingling and numbness. Denies any weight loss or unusual bone pain. No nausea no vomiting no weight loss.   REVIEW OF SYSTEMS:  A complete 10 point review of system is done which is negative except mentioned above/history of present illness.   PAST MEDICAL HISTORY :  Past Medical History:  Diagnosis Date  . Arthritis   . Endometriosis   . Environmental allergies   . GERD (gastroesophageal reflux disease)   . Hypercholesterolemia   . Hypertension   . Uterine fibroid     PAST SURGICAL HISTORY :   Past Surgical History:  Procedure Laterality Date  . ABDOMINAL HYSTERECTOMY    . CESAREAN SECTION     x2  . OOPHORECTOMY     S/P left secondary to cyst  . THYROGLOSSAL DUCT CYST N/A 05/27/2015   Procedure: THYROGLOSSAL DUCT CYST EXCISION ;  Surgeon: Clyde Canterbury, MD;  Location: [facility] ORS;  Service: ENT;  Laterality: N/A;    FAMILY HISTORY :   Family History  Problem Relation Age of Onset  . Heart attack Father 29  . Heart disease Father     bypass surgery  . Hypertension Father   . Hypercholesterolemia Father   . Kidney disease Father   . Diabetes      aunt  . Diabetes Mother   . Breast cancer Neg Hx     SOCIAL HISTORY:   Social History  Substance Use Topics  . Smoking status: Never Smoker  . Smokeless tobacco: Never Used  . Alcohol use No    ALLERGIES:  is allergic to cefdinir and  simvastatin.  MEDICATIONS:  Current Outpatient Prescriptions  Medication Sig Dispense Refill  . atorvastatin (LIPITOR) 20 MG tablet Take 1 tablet (20 mg total) by mouth daily. 30 tablet 4  . felodipine (PLENDIL) 5 MG 24 hr tablet TAKE 1 TABLET (5 MG TOTAL) BY MOUTH DAILY. 90 tablet 1  . fluticasone (FLONASE) 50 MCG/ACT nasal spray Place 2 sprays into both nostrils daily. 16 g 5  . Omeprazole-Sodium Bicarbonate (ZEGERID OTC PO) Take 1 capsule by mouth daily.     Marland Kitchen triamterene-hydrochlorothiazide (MAXZIDE-25) 37.5-25 MG tablet TAKE 0.5 TABLETS BY MOUTH DAILY. 30 tablet 3   No current facility-administered medications for this visit.     PHYSICAL EXAMINATION: ECOG PERFORMANCE STATUS: 0 - Asymptomatic  BP 134/84 (BP Location: Left Arm, Patient Position: Sitting)   Pulse 91   Temp 98.5 F (36.9 C) (Tympanic)   Resp 18   Wt 233 lb 7.5 oz (105.9 kg)   LMP 11/06/2000   BMI 37.68 kg/m   Filed Weights   04/12/16 1033  Weight: 233 lb 7.5 oz (105.9 kg)    GENERAL: Well-nourished well-developed; Alert, no distress and comfortable.  Alone.  EYES: no pallor or icterus OROPHARYNX: no thrush or ulceration; good dentition  NECK: supple, no masses felt LYMPH:  no palpable lymphadenopathy in the cervical, axillary or inguinal regions LUNGS: clear to auscultation  and  No wheeze or crackles HEART/CVS: regular rate & rhythm and no murmurs; No lower extremity edema ABDOMEN:abdomen soft, non-tender and normal bowel sounds Musculoskeletal:no cyanosis of digits and no clubbing  PSYCH: alert & oriented x 3 with fluent speech NEURO: no focal motor/sensory deficits SKIN:  no rashes or significant lesions  LABORATORY DATA:  I have reviewed the data as listed    Component Value Date/Time   NA 140 04/12/2016 1017   K 4.1 04/12/2016 1017   K 3.9 05/27/2015 0615   CL 104 04/12/2016 1017   CO2 29 04/12/2016 1017   GLUCOSE 105 (H) 04/12/2016 1017   BUN 11 04/12/2016 1017   CREATININE 0.92  04/12/2016 1017   CREATININE 0.81 04/21/2014 1629   CALCIUM 9.7 04/12/2016 1017   CALCIUM 9.3 04/21/2014 1629   PROT 7.9 04/12/2016 1017   ALBUMIN 4.3 04/12/2016 1017   AST 18 04/12/2016 1017   ALT 21 04/12/2016 1017   ALKPHOS 78 04/12/2016 1017   BILITOT 0.7 04/12/2016 1017   GFRNONAA >60 04/12/2016 1017   GFRNONAA >60 04/21/2014 1629   GFRAA >60 04/12/2016 1017   GFRAA >60 04/21/2014 1629    No results found for: SPEP, UPEP  Lab Results  Component Value Date   WBC 6.4 04/12/2016   NEUTROABS 3.6 04/12/2016   HGB 13.5 04/12/2016   HCT 40.3 04/12/2016   MCV 83.9 04/12/2016   PLT 424 04/12/2016      Chemistry      Component Value Date/Time   NA 140 04/12/2016 1017   K 4.1 04/12/2016 1017   K 3.9 05/27/2015 0615   CL 104 04/12/2016 1017   CO2 29 04/12/2016 1017   BUN 11 04/12/2016 1017   CREATININE 0.92 04/12/2016 1017   CREATININE 0.81 04/21/2014 1629      Component Value Date/Time   CALCIUM 9.7 04/12/2016 1017   CALCIUM 9.3 04/21/2014 1629   ALKPHOS 78 04/12/2016 1017   AST 18 04/12/2016 1017   ALT 21 04/12/2016 1017   BILITOT 0.7 04/12/2016 1017       RADIOGRAPHIC STUDIES: I have personally reviewed the radiological images as listed and agreed with the findings in the report. No results found.   ASSESSMENT & PLAN:  MGUS (monoclonal gammopathy of unknown significance) Asymptomatic. Patient's M protein in March 2017 0.3 g/dL normal kappa Lambda light chain ratio. CBC CMP today are normal.  # I discussed with the patient at length regarding the small risk of converting into multiple myeloma. Recommend follow-up on a yearly basis.  # Recommend follow-up in spring of 2018/ CBC CMP myeloma panel. She'll call us sooner if needed.   Orders Placed This Encounter  Procedures  . Multiple Myeloma Panel (SPEP&IFE w/QIG)    Standing Status:   Future    Standing Expiration Date:   05/17/2017  . Kappa/lambda light chains    Standing Status:   Future    Standing  Expiration Date:   05/17/2017  . CBC with Differential/Platelet    Please do manual diff- and call if abnormal cells seen    Standing Status:   Future    Standing Expiration Date:   05/17/2017  . Comprehensive metabolic panel    Standing Status:   Future    Standing Expiration Date:   05/17/2017   All questions were answered. The patient knows to call the clinic with any problems, questions or concerns.      Cammie Sickle, MD 04/12/2016 11:08 AM

## 2016-04-12 NOTE — Assessment & Plan Note (Signed)
Asymptomatic. Patient's M protein in March 2017 0.3 g/dL normal kappa Lambda light chain ratio. CBC CMP today are normal.  # I discussed with the patient at length regarding the small risk of converting into multiple myeloma. Recommend follow-up on a yearly basis.  # Recommend follow-up in spring of 2018/ CBC CMP myeloma panel. She'll call us sooner if needed.

## 2016-04-12 NOTE — Progress Notes (Signed)
Patient states she has had an URI for past several weeks.  Saw PCP last week.  Otherwise no complaints.

## 2016-04-13 ENCOUNTER — Telehealth: Payer: Self-pay | Admitting: *Deleted

## 2016-04-13 ENCOUNTER — Other Ambulatory Visit (INDEPENDENT_AMBULATORY_CARE_PROVIDER_SITE_OTHER): Payer: BC Managed Care – PPO

## 2016-04-13 DIAGNOSIS — E7800 Pure hypercholesterolemia, unspecified: Secondary | ICD-10-CM

## 2016-04-13 DIAGNOSIS — I10 Essential (primary) hypertension: Secondary | ICD-10-CM | POA: Diagnosis not present

## 2016-04-13 LAB — BASIC METABOLIC PANEL
BUN: 10 mg/dL (ref 6–23)
CHLORIDE: 102 meq/L (ref 96–112)
CO2: 29 meq/L (ref 19–32)
CREATININE: 0.78 mg/dL (ref 0.40–1.20)
Calcium: 10.2 mg/dL (ref 8.4–10.5)
GFR: 99.78 mL/min (ref >60.00)
Glucose, Bld: 93 mg/dL (ref 70–99)
POTASSIUM: 3.8 meq/L (ref 3.5–5.1)
Sodium: 139 mEq/L (ref 135–145)

## 2016-04-13 LAB — HEPATIC FUNCTION PANEL
ALK PHOS: 79 U/L (ref 39–117)
ALT: 19 U/L (ref 0–35)
AST: 16 U/L (ref 0–37)
Albumin: 4.4 g/dL (ref 3.5–5.2)
Bilirubin, Direct: 0.1 mg/dL (ref 0.0–0.3)
TOTAL PROTEIN: 7.8 g/dL (ref 6.0–8.3)
Total Bilirubin: 0.6 mg/dL (ref 0.2–1.2)

## 2016-04-13 LAB — LIPID PANEL
CHOLESTEROL: 213 mg/dL — AB (ref 0–200)
HDL: 74.9 mg/dL (ref >39.00)
LDL Cholesterol: 119 mg/dL — ABNORMAL HIGH (ref 0–99)
NonHDL: 138.06
TRIGLYCERIDES: 94 mg/dL (ref 0.0–149.0)
Total CHOL/HDL Ratio: 3
VLDL: 18.8 mg/dL (ref 0.0–40.0)

## 2016-04-13 LAB — TSH: TSH: 2 u[IU]/mL (ref 0.35–4.50)

## 2016-04-13 MED ORDER — FLUTICASONE PROPIONATE 50 MCG/ACT NA SUSP: 2.0000 | Freq: Every day | NASAL | 5 refills | Status: DC

## 2016-04-13 NOTE — Telephone Encounter (Signed)
Patient has requested a medication refill for fluticasone Pharmacy [facility]

## 2016-04-13 NOTE — Telephone Encounter (Signed)
Refill sent.

## 2016-04-15 ENCOUNTER — Encounter: Payer: Self-pay | Admitting: Internal Medicine

## 2016-05-06 ENCOUNTER — Other Ambulatory Visit: Payer: BC Managed Care – PPO

## 2016-05-10 ENCOUNTER — Telehealth: Payer: Self-pay | Admitting: *Deleted

## 2016-05-10 ENCOUNTER — Ambulatory Visit (INDEPENDENT_AMBULATORY_CARE_PROVIDER_SITE_OTHER): Payer: BC Managed Care – PPO | Admitting: Internal Medicine

## 2016-05-10 ENCOUNTER — Encounter: Payer: Self-pay | Admitting: Internal Medicine

## 2016-05-10 DIAGNOSIS — D649 Anemia, unspecified: Secondary | ICD-10-CM

## 2016-05-10 DIAGNOSIS — D473 Essential (hemorrhagic) thrombocythemia: Secondary | ICD-10-CM

## 2016-05-10 DIAGNOSIS — I10 Essential (primary) hypertension: Secondary | ICD-10-CM

## 2016-05-10 DIAGNOSIS — E7800 Pure hypercholesterolemia, unspecified: Secondary | ICD-10-CM | POA: Diagnosis not present

## 2016-05-10 DIAGNOSIS — D75839 Thrombocytosis, unspecified: Secondary | ICD-10-CM

## 2016-05-10 DIAGNOSIS — D472 Monoclonal gammopathy: Secondary | ICD-10-CM

## 2016-05-10 DIAGNOSIS — K219 Gastro-esophageal reflux disease without esophagitis: Secondary | ICD-10-CM

## 2016-05-10 MED ORDER — ATORVASTATIN CALCIUM 20 MG PO TABS: 20.0000 mg | ORAL_TABLET | Freq: Every day | ORAL | 5 refills | Status: DC

## 2016-05-10 MED ORDER — FLUTICASONE PROPIONATE 50 MCG/ACT NA SUSP: 2.0000 | Freq: Every day | NASAL | 5 refills | Status: DC

## 2016-05-10 MED ORDER — FELODIPINE ER 5 MG PO TB24: ORAL_TABLET | ORAL | 1 refills | Status: DC

## 2016-05-10 MED ORDER — TRIAMTERENE-HCTZ 37.5-25 MG PO TABS: ORAL_TABLET | ORAL | 5 refills | Status: DC

## 2016-05-10 NOTE — Progress Notes (Signed)
Patient ID: ALEEYA DEMBY, female   DOB: 01-25-1966, 50 y.o.   MRN: [id number]   Subjective:    Patient ID: BRITTANYA COATSWORTH, female    DOB: [birth year], 50 y.o.   MRN: [id number]  HPI  Patient here for a scheduled follow up.  She is doing well. Feels good.  Trying to stay active.  No chest pain.  No sob.  No acid reflux.  No abdominal pain or cramping.  Bowels stable.  Increased stress with work.  Handling things relatively well.  Blood pressure doing well.  Following with hematology.     Past Medical History:  Diagnosis Date  . Arthritis   . Endometriosis   . Environmental allergies   . GERD (gastroesophageal reflux disease)   . Hypercholesterolemia   . Hypertension   . Uterine fibroid    Past Surgical History:  Procedure Laterality Date  . ABDOMINAL HYSTERECTOMY    . CESAREAN SECTION     x2  . OOPHORECTOMY     S/P left secondary to cyst  . THYROGLOSSAL DUCT CYST N/A 05/27/2015   Procedure: THYROGLOSSAL DUCT CYST EXCISION ;  Surgeon: Clyde Canterbury, MD;  Location: [facility] ORS;  Service: ENT;  Laterality: N/A;   Family History  Problem Relation Age of Onset  . Heart attack Father 56  . Heart disease Father     bypass surgery  . Hypertension Father   . Hypercholesterolemia Father   . Kidney disease Father   . Diabetes Mother   . Diabetes      aunt  . Breast cancer Neg Hx    Social History   Social History  . Marital status: Married    Spouse name: N/A  . Number of children: 2  . Years of education: N/A   Social History Main Topics  . Smoking status: Never Smoker  . Smokeless tobacco: Never Used  . Alcohol use No  . Drug use: No  . Sexual activity: Not Asked   Other Topics Concern  . None   Social History Narrative  . None    Outpatient Encounter Prescriptions as of 05/10/2016  Medication Sig  . atorvastatin (LIPITOR) 20 MG tablet Take 1 tablet (20 mg total) by mouth daily.  . felodipine (PLENDIL) 5 MG 24 hr tablet TAKE 1 TABLET (5 MG TOTAL) BY MOUTH  DAILY.  . fluticasone (FLONASE) 50 MCG/ACT nasal spray Place 2 sprays into both nostrils daily.  [name] Take 1 capsule by mouth daily.   Marland Kitchen triamterene-hydrochlorothiazide (MAXZIDE-25) 37.5-25 MG tablet TAKE 0.5 TABLETS BY MOUTH DAILY.  . [DISCONTINUED] atorvastatin (LIPITOR) 20 MG tablet Take 1 tablet (20 mg total) by mouth daily.  . [DISCONTINUED] felodipine (PLENDIL) 5 MG 24 hr tablet TAKE 1 TABLET (5 MG TOTAL) BY MOUTH DAILY.  . [DISCONTINUED] fluticasone (FLONASE) 50 MCG/ACT nasal spray Place 2 sprays into both nostrils daily.  . [DISCONTINUED] triamterene-hydrochlorothiazide (MAXZIDE-25) 37.5-25 MG tablet TAKE 0.5 TABLETS BY MOUTH DAILY.   No facility-administered encounter medications on file as of 05/10/2016.     Review of Systems  Constitutional: Negative for appetite change and unexpected weight change.  HENT: Negative for congestion and sinus pressure.   Respiratory: Negative for cough, chest tightness and shortness of breath.   Cardiovascular: Negative for chest pain, palpitations and leg swelling.  Gastrointestinal: Negative for abdominal pain, diarrhea and nausea.  Genitourinary: Negative for difficulty urinating and dysuria.  Musculoskeletal: Negative for back pain and joint swelling.  Skin: Negative for color change  and rash.  Neurological: Negative for dizziness, light-headedness and headaches.  Psychiatric/Behavioral: Negative for agitation and dysphoric mood.       Objective:     Blood pressure rechecked by me:  122/72  Physical Exam  Constitutional: She appears well-developed and well-nourished. No distress.  HENT:  Nose: Nose normal.  Mouth/Throat: Oropharynx is clear and moist.  Neck: Neck supple. No thyromegaly present.  Cardiovascular: Normal rate and regular rhythm.   Pulmonary/Chest: Breath sounds normal. No respiratory distress. She has no wheezes.  Abdominal: Soft. Bowel sounds are normal. There is no  tenderness.  Musculoskeletal: She exhibits no edema or tenderness.  Lymphadenopathy:    She has no cervical adenopathy.  Skin: No rash noted. No erythema.  Psychiatric: She has a normal mood and affect. Her behavior is normal.    BP 118/68   Pulse 84   Temp 98.1 F (36.7 C) (Oral)   Ht 5\' 6"  (1.676 m)   Wt 234 lb 12.8 oz (106.5 kg)   LMP 11/06/2000   SpO2 97%   BMI 37.90 kg/m  Wt Readings from Last 3 Encounters:  05/10/16 234 lb 12.8 oz (106.5 kg)  04/12/16 233 lb 7.5 oz (105.9 kg)  03/28/16 228 lb 8 oz (103.6 kg)     Lab Results  Component Value Date   WBC 6.4 04/12/2016   HGB 13.5 04/12/2016   HCT 40.3 04/12/2016   PLT 424 04/12/2016   GLUCOSE 93 04/13/2016   CHOL 213 (H) 04/13/2016   TRIG 94.0 04/13/2016   HDL 74.90 04/13/2016   LDLDIRECT 164.5 07/24/2013   LDLCALC 119 (H) 04/13/2016   ALT 19 04/13/2016   AST 16 04/13/2016   NA 139 04/13/2016   K 3.8 04/13/2016   CL 102 04/13/2016   CREATININE 0.78 04/13/2016   BUN 10 04/13/2016   CO2 29 04/13/2016   TSH 2.00 04/13/2016   INR 1.0 04/21/2014    Mm Digital Screening Bilateral  Result Date: 11/06/2015 CLINICAL DATA:  Screening. EXAM: DIGITAL SCREENING BILATERAL MAMMOGRAM WITH CAD COMPARISON:  Previous exam(s). ACR Breast Density Category b: There are scattered areas of fibroglandular density. FINDINGS: There are no findings suspicious for malignancy. Images were processed with CAD. IMPRESSION: No mammographic evidence of malignancy. A result letter of this screening mammogram will be mailed directly to the patient. RECOMMENDATION: Screening mammogram in one year. (Code:SM-B-01Y) BI-RADS CATEGORY  1: Negative. Electronically Signed   By: Ammie Ferrier M.D.   On: 11/06/2015 08:09       Assessment & Plan:   Problem List Items Addressed This Visit    Anemia    Followed by hematology.        Esophageal reflux    On omeprazole.  Upper symptoms controlled.        Essential hypertension, benign    Blood  pressure under good control.  Continue same medication regimen.  Follow pressures.  Follow metabolic panel.        Relevant Medications   atorvastatin (LIPITOR) 20 MG tablet   felodipine (PLENDIL) 5 MG 24 hr tablet   triamterene-hydrochlorothiazide (MAXZIDE-25) 37.5-25 MG tablet   Other Relevant Orders   Basic metabolic panel   MGUS (monoclonal gammopathy of unknown significance)    Followed by hematology.        Pure hypercholesterolemia    On lipitor.  Low cholesterol diet and exercise.  Follow lipid panel and liver function tests.        Relevant Medications   atorvastatin (LIPITOR) 20 MG tablet  felodipine (PLENDIL) 5 MG 24 hr tablet   triamterene-hydrochlorothiazide (MAXZIDE-25) 37.5-25 MG tablet   Other Relevant Orders   Lipid panel   Hepatic function panel   Thrombocytosis (HCC)    Follow cbc.         Other Visit Diagnoses   None.      Einar Pheasant, MD

## 2016-05-10 NOTE — Telephone Encounter (Signed)
Pt will need a 4 month scheduled  appt for a physical along with a lab appt

## 2016-05-10 NOTE — Progress Notes (Signed)
Pre visit review using our clinic review tool, if applicable. No additional management support is needed unless otherwise documented below in the visit note. 

## 2016-05-11 NOTE — Telephone Encounter (Signed)
Lm on vm for pt to call back and schedule cpe after 09/09/16 and labs prior

## 2016-05-15 NOTE — Assessment & Plan Note (Signed)
On omeprazole.  Upper symptoms controlled.   

## 2016-05-15 NOTE — Assessment & Plan Note (Signed)
Followed by hematology 

## 2016-05-15 NOTE — Assessment & Plan Note (Signed)
On lipitor.  Low cholesterol diet and exercise.  Follow lipid panel and liver function tests.   

## 2016-05-15 NOTE — Assessment & Plan Note (Signed)
Blood pressure under good control.  Continue same medication regimen.  Follow pressures.  Follow metabolic panel.   

## 2016-05-15 NOTE — Assessment & Plan Note (Signed)
Follow cbc.  

## 2016-07-06 ENCOUNTER — Encounter: Payer: Self-pay | Admitting: Internal Medicine

## 2016-07-06 NOTE — Telephone Encounter (Signed)
Please call and see if pt can come in Thursday 03/06/16 at 4:30.  Thanks

## 2016-07-06 NOTE — Telephone Encounter (Signed)
Pt scheduled Ok with appt

## 2016-07-07 ENCOUNTER — Encounter: Payer: Self-pay | Admitting: Internal Medicine

## 2016-07-07 ENCOUNTER — Ambulatory Visit (INDEPENDENT_AMBULATORY_CARE_PROVIDER_SITE_OTHER): Payer: BC Managed Care – PPO | Admitting: Internal Medicine

## 2016-07-07 ENCOUNTER — Ambulatory Visit: Payer: BC Managed Care – PPO | Admitting: Internal Medicine

## 2016-07-07 DIAGNOSIS — F439 Reaction to severe stress, unspecified: Secondary | ICD-10-CM | POA: Diagnosis not present

## 2016-07-07 MED ORDER — ALPRAZOLAM 0.25 MG PO TABS: 0.2500 mg | ORAL_TABLET | Freq: Two times a day (BID) | ORAL | 0 refills | Status: DC | PRN

## 2016-07-07 NOTE — Progress Notes (Signed)
Patient ID: Kathleen Gould, female   DOB: 03/30/1966, 50 y.o.   MRN: [id number]   Subjective:    Patient ID: Kathleen Gould, female    DOB: 14-Oct-1965, 50 y.o.   MRN: [id number]  HPI  Patient here as a work in.  Her husband passed away unexpectedly last week.  She is accompanied by her son.  History mostly obtained from the patient.  She is not sleeping well.  Her mind will not shut down.  Some decreased appetite.  No nausea or vomiting.  Has good support.  Feels needs something to help calm her down.    Past Medical History:  Diagnosis Date  . Arthritis   . Endometriosis   . Environmental allergies   . GERD (gastroesophageal reflux disease)   . Hypercholesterolemia   . Hypertension   . Uterine fibroid    Past Surgical History:  Procedure Laterality Date  . ABDOMINAL HYSTERECTOMY    . CESAREAN SECTION     x2  . OOPHORECTOMY     S/P left secondary to cyst  . THYROGLOSSAL DUCT CYST N/A 05/27/2015   Procedure: THYROGLOSSAL DUCT CYST EXCISION ;  Surgeon: Clyde Canterbury, MD;  Location: [facility] ORS;  Service: ENT;  Laterality: N/A;   Family History  Problem Relation Age of Onset  . Heart attack Father 26  . Heart disease Father     bypass surgery  . Hypertension Father   . Hypercholesterolemia Father   . Kidney disease Father   . Diabetes Mother   . Diabetes      aunt  . Breast cancer Neg Hx    Social History   Social History  . Marital status: Married    Spouse name: N/A  . Number of children: 2  . Years of education: N/A   Social History Main Topics  . Smoking status: Never Smoker  . Smokeless tobacco: Never Used  . Alcohol use No  . Drug use: No  . Sexual activity: Not Asked   Other Topics Concern  . None   Social History Narrative  . None    Outpatient Encounter Prescriptions as of 07/07/2016  Medication Sig  . atorvastatin (LIPITOR) 20 MG tablet Take 1 tablet (20 mg total) by mouth daily.  . felodipine (PLENDIL) 5 MG 24 hr tablet TAKE 1 TABLET (5 MG  TOTAL) BY MOUTH DAILY.  . fluticasone (FLONASE) 50 MCG/ACT nasal spray Place 2 sprays into both nostrils daily.  [name] Take 1 capsule by mouth daily.   Marland Kitchen triamterene-hydrochlorothiazide (MAXZIDE-25) 37.5-25 MG tablet TAKE 0.5 TABLETS BY MOUTH DAILY.  Marland Kitchen ALPRAZolam (XANAX) 0.25 MG tablet Take 1 tablet (0.25 mg total) by mouth 2 (two) times daily as needed for anxiety.   No facility-administered encounter medications on file as of 07/07/2016.     Review of Systems  Constitutional: Negative for unexpected weight change.       Some decreased appetite.    Respiratory: Negative for cough and shortness of breath.   Gastrointestinal: Negative for diarrhea, nausea and vomiting.  Psychiatric/Behavioral:       Increased stress as outlined.  Discussed with her today.  Not sleeping well.         Objective:    Physical Exam  Constitutional: She appears well-developed and well-nourished. No distress.  Neck: Neck supple.  Cardiovascular: Normal rate and regular rhythm.   Pulmonary/Chest: Breath sounds normal. No respiratory distress. She has no wheezes.  Abdominal: Soft. Bowel sounds are normal. There  is no tenderness.  Musculoskeletal: She exhibits no edema or tenderness.  Lymphadenopathy:    She has no cervical adenopathy.    BP 124/68   Pulse 73   Temp 98.7 F (37.1 C) (Oral)   Wt 228 lb 6.4 oz (103.6 kg)   LMP 11/06/2000   SpO2 96%   BMI 36.86 kg/m  Wt Readings from Last 3 Encounters:  07/07/16 228 lb 6.4 oz (103.6 kg)  05/10/16 234 lb 12.8 oz (106.5 kg)  04/12/16 233 lb 7.5 oz (105.9 kg)     Lab Results  Component Value Date   WBC 6.4 04/12/2016   HGB 13.5 04/12/2016   HCT 40.3 04/12/2016   PLT 424 04/12/2016   GLUCOSE 93 04/13/2016   CHOL 213 (H) 04/13/2016   TRIG 94.0 04/13/2016   HDL 74.90 04/13/2016   LDLDIRECT 164.5 07/24/2013   LDLCALC 119 (H) 04/13/2016   ALT 19 04/13/2016   AST 16 04/13/2016   NA 139 04/13/2016   K  3.8 04/13/2016   CL 102 04/13/2016   CREATININE 0.78 04/13/2016   BUN 10 04/13/2016   CO2 29 04/13/2016   TSH 2.00 04/13/2016   INR 1.0 04/21/2014    Mm Digital Screening Bilateral  Result Date: 11/06/2015 CLINICAL DATA:  Screening. EXAM: DIGITAL SCREENING BILATERAL MAMMOGRAM WITH CAD COMPARISON:  Previous exam(s). ACR Breast Density Category b: There are scattered areas of fibroglandular density. FINDINGS: There are no findings suspicious for malignancy. Images were processed with CAD. IMPRESSION: No mammographic evidence of malignancy. A result letter of this screening mammogram will be mailed directly to the patient. RECOMMENDATION: Screening mammogram in one year. (Code:SM-B-01Y) BI-RADS CATEGORY  1: Negative. Electronically Signed   By: Ammie Ferrier M.D.   On: 11/06/2015 08:09       Assessment & Plan:   Problem List Items Addressed This Visit    Stress    Increased stress as outlined.  Discussed at length with her today.  Has good support.  Feels needs something to help her relax.  Will start xanax as directed.  Follow.  Get her back in soon to reassess.  Notify me if feel needs anything more.            Einar Pheasant, MD

## 2016-07-07 NOTE — Progress Notes (Signed)
Pre visit review using our clinic review tool, if applicable. No additional management support is needed unless otherwise documented below in the visit note. 

## 2016-07-17 ENCOUNTER — Encounter: Payer: Self-pay | Admitting: Internal Medicine

## 2016-07-17 DIAGNOSIS — F439 Reaction to severe stress, unspecified: Secondary | ICD-10-CM | POA: Insufficient documentation

## 2016-07-17 NOTE — Assessment & Plan Note (Signed)
Increased stress as outlined.  Discussed at length with her today.  Has good support.  Feels needs something to help her relax.  Will start xanax as directed.  Follow.  Get her back in soon to reassess.  Notify me if feel needs anything more.

## 2016-07-20 ENCOUNTER — Encounter: Payer: Self-pay | Admitting: Internal Medicine

## 2016-07-20 ENCOUNTER — Ambulatory Visit (INDEPENDENT_AMBULATORY_CARE_PROVIDER_SITE_OTHER): Payer: BC Managed Care – PPO | Admitting: Internal Medicine

## 2016-07-20 DIAGNOSIS — F439 Reaction to severe stress, unspecified: Secondary | ICD-10-CM | POA: Diagnosis not present

## 2016-07-20 DIAGNOSIS — I10 Essential (primary) hypertension: Secondary | ICD-10-CM | POA: Diagnosis not present

## 2016-07-20 NOTE — Progress Notes (Signed)
Pre visit review using our clinic review tool, if applicable. No additional management support is needed unless otherwise documented below in the visit note. 

## 2016-07-20 NOTE — Progress Notes (Signed)
Patient ID: ANSHU STAAL, female   DOB: 07-18-1966, 50 y.o.   MRN: [id number]   Subjective:    Patient ID: MABELL DEVER, female    DOB: [birth year], 50 y.o.   MRN: [id number]  HPI  Patient here for a scheduled follow up.  Was seen recently after her husband passed away unexpectedly.  She was given xanax to take as needed.  She reports she is doing ok.  Has only taken the xanax on a couple of occasions.  Is sleeping some better.  Eating better.  Back at work.  Feels better if keeps busy.  States physically she is doing ok.  Breathing stable.     Past Medical History:  Diagnosis Date  . Arthritis   . Endometriosis   . Environmental allergies   . GERD (gastroesophageal reflux disease)   . Hypercholesterolemia   . Hypertension   . Uterine fibroid    Past Surgical History:  Procedure Laterality Date  . ABDOMINAL HYSTERECTOMY    . CESAREAN SECTION     x2  . OOPHORECTOMY     S/P left secondary to cyst  . THYROGLOSSAL DUCT CYST N/A 05/27/2015   Procedure: THYROGLOSSAL DUCT CYST EXCISION ;  Surgeon: Clyde Canterbury, MD;  Location: [facility] ORS;  Service: ENT;  Laterality: N/A;   Family History  Problem Relation Age of Onset  . Heart attack Father 27  . Heart disease Father     bypass surgery  . Hypertension Father   . Hypercholesterolemia Father   . Kidney disease Father   . Diabetes Mother   . Diabetes      aunt  . Breast cancer Neg Hx    Social History   Social History  . Marital status: Married    Spouse name: N/A  . Number of children: 2  . Years of education: N/A   Social History Main Topics  . Smoking status: Never Smoker  . Smokeless tobacco: Never Used  . Alcohol use No  . Drug use: No  . Sexual activity: Not Asked   Other Topics Concern  . None   Social History Narrative  . None    Outpatient Encounter Prescriptions as of 07/20/2016  Medication Sig  . ALPRAZolam (XANAX) 0.25 MG tablet Take 1 tablet (0.25 mg total) by mouth 2 (two) times daily as  needed for anxiety.  Marland Kitchen atorvastatin (LIPITOR) 20 MG tablet Take 1 tablet (20 mg total) by mouth daily.  . felodipine (PLENDIL) 5 MG 24 hr tablet TAKE 1 TABLET (5 MG TOTAL) BY MOUTH DAILY.  . fluticasone (FLONASE) 50 MCG/ACT nasal spray Place 2 sprays into both nostrils daily.  [name] Take 1 capsule by mouth daily.   Marland Kitchen triamterene-hydrochlorothiazide (MAXZIDE-25) 37.5-25 MG tablet TAKE 0.5 TABLETS BY MOUTH DAILY.   No facility-administered encounter medications on file as of 07/20/2016.     Review of Systems  Constitutional:       States she is eating better.  Weight is down.    HENT: Negative for congestion and sinus pressure.   Respiratory: Negative for cough, chest tightness and shortness of breath.   Cardiovascular: Negative for chest pain, palpitations and leg swelling.  Gastrointestinal: Negative for abdominal pain, diarrhea, nausea and vomiting.  Skin: Negative for color change and rash.  Neurological: Negative for dizziness and headaches.  Psychiatric/Behavioral: Negative for agitation.       Increased stress as outlined.  Sleeping some better.        Objective:  Physical Exam  Constitutional: She appears well-developed and well-nourished. No distress.  HENT:  Nose: Nose normal.  Mouth/Throat: Oropharynx is clear and moist.  Neck: Neck supple. No thyromegaly present.  Cardiovascular: Normal rate and regular rhythm.   Pulmonary/Chest: Breath sounds normal. No respiratory distress. She has no wheezes.  Abdominal: Soft. Bowel sounds are normal. There is no tenderness.  Musculoskeletal: She exhibits no edema or tenderness.  Lymphadenopathy:    She has no cervical adenopathy.  Skin: No rash noted. No erythema.  Psychiatric: She has a normal mood and affect. Her behavior is normal.    BP 124/70   Pulse 79   Temp 98.3 F (36.8 C) (Oral)   Ht 5\' 6"  (1.676 m)   Wt 222 lb 3.2 oz (100.8 kg)   LMP 11/06/2000   SpO2 96%   BMI  35.86 kg/m  Wt Readings from Last 3 Encounters:  07/20/16 222 lb 3.2 oz (100.8 kg)  07/07/16 228 lb 6.4 oz (103.6 kg)  05/10/16 234 lb 12.8 oz (106.5 kg)     Lab Results  Component Value Date   WBC 6.4 04/12/2016   HGB 13.5 04/12/2016   HCT 40.3 04/12/2016   PLT 424 04/12/2016   GLUCOSE 93 04/13/2016   CHOL 213 (H) 04/13/2016   TRIG 94.0 04/13/2016   HDL 74.90 04/13/2016   LDLDIRECT 164.5 07/24/2013   LDLCALC 119 (H) 04/13/2016   ALT 19 04/13/2016   AST 16 04/13/2016   NA 139 04/13/2016   K 3.8 04/13/2016   CL 102 04/13/2016   CREATININE 0.78 04/13/2016   BUN 10 04/13/2016   CO2 29 04/13/2016   TSH 2.00 04/13/2016   INR 1.0 04/21/2014    Mm Digital Screening Bilateral  Result Date: 11/06/2015 CLINICAL DATA:  Screening. EXAM: DIGITAL SCREENING BILATERAL MAMMOGRAM WITH CAD COMPARISON:  Previous exam(s). ACR Breast Density Category b: There are scattered areas of fibroglandular density. FINDINGS: There are no findings suspicious for malignancy. Images were processed with CAD. IMPRESSION: No mammographic evidence of malignancy. A result letter of this screening mammogram will be mailed directly to the patient. RECOMMENDATION: Screening mammogram in one year. (Code:SM-B-01Y) BI-RADS CATEGORY  1: Negative. Electronically Signed   By: Ammie Ferrier M.D.   On: 11/06/2015 08:09       Assessment & Plan:   Problem List Items Addressed This Visit    Essential hypertension, benign    Blood pressure under good control.  Continue same medication regimen.  Follow pressures.  Follow metabolic panel.        Stress    Increased stress as outlined.  She is doing some better.  Sleeping some better.  Has xanax if needed.  Rarely takes.  Is back at work.  Follow.  Will let me know if feels needs anything more.  Does not feel needs counseling at this time.  Has good support.            Einar Pheasant, MD

## 2016-07-25 ENCOUNTER — Encounter: Payer: Self-pay | Admitting: Internal Medicine

## 2016-07-25 NOTE — Assessment & Plan Note (Signed)
Blood pressure under good control.  Continue same medication regimen.  Follow pressures.  Follow metabolic panel.   

## 2016-07-25 NOTE — Assessment & Plan Note (Signed)
Increased stress as outlined.  She is doing some better.  Sleeping some better.  Has xanax if needed.  Rarely takes.  Is back at work.  Follow.  Will let me know if feels needs anything more.  Does not feel needs counseling at this time.  Has good support.

## 2016-09-09 ENCOUNTER — Other Ambulatory Visit (INDEPENDENT_AMBULATORY_CARE_PROVIDER_SITE_OTHER): Payer: BC Managed Care – PPO

## 2016-09-09 DIAGNOSIS — E7800 Pure hypercholesterolemia, unspecified: Secondary | ICD-10-CM | POA: Diagnosis not present

## 2016-09-09 DIAGNOSIS — I10 Essential (primary) hypertension: Secondary | ICD-10-CM | POA: Diagnosis not present

## 2016-09-09 LAB — BASIC METABOLIC PANEL
BUN: 9 mg/dL (ref 6–23)
CHLORIDE: 101 meq/L (ref 96–112)
CO2: 33 mEq/L — ABNORMAL HIGH (ref 19–32)
Calcium: 10.3 mg/dL (ref 8.4–10.5)
Creatinine, Ser: 0.72 mg/dL (ref 0.40–1.20)
GFR: 109.26 mL/min (ref >60.00)
GLUCOSE: 92 mg/dL (ref 70–99)
POTASSIUM: 3.7 meq/L (ref 3.5–5.1)
Sodium: 140 mEq/L (ref 135–145)

## 2016-09-09 LAB — HEPATIC FUNCTION PANEL
ALK PHOS: 75 U/L (ref 39–117)
ALT: 15 U/L (ref 0–35)
AST: 13 U/L (ref 0–37)
Albumin: 4.3 g/dL (ref 3.5–5.2)
BILIRUBIN DIRECT: 0.2 mg/dL (ref 0.0–0.3)
BILIRUBIN TOTAL: 0.7 mg/dL (ref 0.2–1.2)
Total Protein: 7.3 g/dL (ref 6.0–8.3)

## 2016-09-09 LAB — LIPID PANEL
CHOL/HDL RATIO: 3
CHOLESTEROL: 218 mg/dL — AB (ref 0–200)
HDL: 67.2 mg/dL (ref >39.00)
LDL CALC: 136 mg/dL — AB (ref 0–99)
NonHDL: 150.73
Triglycerides: 72 mg/dL (ref 0.0–149.0)
VLDL: 14.4 mg/dL (ref 0.0–40.0)

## 2016-09-10 ENCOUNTER — Encounter: Payer: Self-pay | Admitting: Internal Medicine

## 2016-09-12 ENCOUNTER — Encounter: Payer: Self-pay | Admitting: Internal Medicine

## 2016-09-12 ENCOUNTER — Ambulatory Visit (INDEPENDENT_AMBULATORY_CARE_PROVIDER_SITE_OTHER): Payer: BC Managed Care – PPO | Admitting: Internal Medicine

## 2016-09-12 ENCOUNTER — Ambulatory Visit (INDEPENDENT_AMBULATORY_CARE_PROVIDER_SITE_OTHER): Payer: BC Managed Care – PPO

## 2016-09-12 VITALS — BP 110/74 | HR 95 | Temp 98.4°F | Resp 16 | Ht 65.75 in | Wt 214.0 lb

## 2016-09-12 DIAGNOSIS — E7800 Pure hypercholesterolemia, unspecified: Secondary | ICD-10-CM

## 2016-09-12 DIAGNOSIS — F439 Reaction to severe stress, unspecified: Secondary | ICD-10-CM

## 2016-09-12 DIAGNOSIS — M549 Dorsalgia, unspecified: Secondary | ICD-10-CM | POA: Diagnosis not present

## 2016-09-12 DIAGNOSIS — I10 Essential (primary) hypertension: Secondary | ICD-10-CM

## 2016-09-12 DIAGNOSIS — D472 Monoclonal gammopathy: Secondary | ICD-10-CM

## 2016-09-12 DIAGNOSIS — R079 Chest pain, unspecified: Secondary | ICD-10-CM

## 2016-09-12 DIAGNOSIS — R059 Cough, unspecified: Secondary | ICD-10-CM

## 2016-09-12 DIAGNOSIS — K219 Gastro-esophageal reflux disease without esophagitis: Secondary | ICD-10-CM

## 2016-09-12 DIAGNOSIS — Z0000 Encounter for general adult medical examination without abnormal findings: Secondary | ICD-10-CM | POA: Diagnosis not present

## 2016-09-12 DIAGNOSIS — D75839 Thrombocytosis, unspecified: Secondary | ICD-10-CM

## 2016-09-12 DIAGNOSIS — R05 Cough: Secondary | ICD-10-CM | POA: Diagnosis not present

## 2016-09-12 DIAGNOSIS — D473 Essential (hemorrhagic) thrombocythemia: Secondary | ICD-10-CM

## 2016-09-12 DIAGNOSIS — Z9109 Other allergy status, other than to drugs and biological substances: Secondary | ICD-10-CM

## 2016-09-12 MED ORDER — TIZANIDINE HCL 2 MG PO TABS: 2.0000 mg | ORAL_TABLET | Freq: Every evening | ORAL | 0 refills | Status: DC | PRN

## 2016-09-12 NOTE — Assessment & Plan Note (Signed)
Physical today 09/12/16.  Colonoscopy 02/2008.  Mammogram 11/06/15 - Birads I.

## 2016-09-12 NOTE — Progress Notes (Signed)
Pre visit review using our clinic review tool, if applicable. No additional management support is needed unless otherwise documented below in the visit note. 

## 2016-09-12 NOTE — Progress Notes (Signed)
Patient ID: Kathleen Gould, female   DOB: 1966-02-10, 51 y.o.   MRN: [id number]   Subjective:    Patient ID: Kathleen Gould, female    DOB: 12-18-1965, 51 y.o.   MRN: [id number]  HPI  Patient here for her physical exam.  Still with increased stress in trying to cope with her husband's death.  Has good support.  Does not feel needs any further intervention.  She reports some congestion.  Notices more at night.  Some nausea.  She is eating.  Some back pain.  Does not appear to worsen with movement.  Some soreness in her chest.  Reproducible with palpation.  No sob.  When she walks and exert herself, no chest pain.  Has not been as active.  Bowels stable.  Not sleeping.  No taking xanax.  Does not want to take.     Past Medical History:  Diagnosis Date  . Arthritis   . Endometriosis   . Environmental allergies   . GERD (gastroesophageal reflux disease)   . Hypercholesterolemia   . Hypertension   . Uterine fibroid    Past Surgical History:  Procedure Laterality Date  . ABDOMINAL HYSTERECTOMY    . CESAREAN SECTION     x2  . OOPHORECTOMY     S/P left secondary to cyst  . THYROGLOSSAL DUCT CYST N/A 05/27/2015   Procedure: THYROGLOSSAL DUCT CYST EXCISION ;  Surgeon: Clyde Canterbury, MD;  Location: [facility] ORS;  Service: ENT;  Laterality: N/A;   Family History  Problem Relation Age of Onset  . Heart attack Father 69  . Heart disease Father     bypass surgery  . Hypertension Father   . Hypercholesterolemia Father   . Kidney disease Father   . Diabetes Mother   . Diabetes      aunt  . Breast cancer Neg Hx    Social History   Social History  . Marital status: Married    Spouse name: N/A  . Number of children: 2  . Years of education: N/A   Social History Main Topics  . Smoking status: Never Smoker  . Smokeless tobacco: Never Used  . Alcohol use No  . Drug use: No  . Sexual activity: Not Asked   Other Topics Concern  . None   Social History Narrative  . None     Outpatient Encounter Prescriptions as of 09/12/2016  Medication Sig  . ALPRAZolam (XANAX) 0.25 MG tablet Take 1 tablet (0.25 mg total) by mouth 2 (two) times daily as needed for anxiety. (Patient not taking: Reported on 09/12/2016)  . atorvastatin (LIPITOR) 20 MG tablet Take 1 tablet (20 mg total) by mouth daily. (Patient not taking: Reported on 09/12/2016)  . felodipine (PLENDIL) 5 MG 24 hr tablet TAKE 1 TABLET (5 MG TOTAL) BY MOUTH DAILY. (Patient not taking: Reported on 09/12/2016)  . fluticasone (FLONASE) 50 MCG/ACT nasal spray Place 2 sprays into both nostrils daily. (Patient not taking: Reported on 09/12/2016)  . Omeprazole-Sodium Bicarbonate (ZEGERID OTC PO) Take 1 capsule by mouth daily.   Marland Kitchen tiZANidine (ZANAFLEX) 2 MG tablet Take 1 tablet (2 mg total) by mouth at bedtime as needed for muscle spasms.  Marland Kitchen triamterene-hydrochlorothiazide (MAXZIDE-25) 37.5-25 MG tablet TAKE 0.5 TABLETS BY MOUTH DAILY. (Patient not taking: Reported on 09/12/2016)   No facility-administered encounter medications on file as of 09/12/2016.     Review of Systems  Constitutional: Positive for appetite change.       Decreased appetite.  Some weight  loss.    HENT: Positive for congestion and postnasal drip. Negative for sinus pressure.   Eyes: Negative for pain and visual disturbance.  Respiratory: Negative for cough, chest tightness and shortness of breath.   Cardiovascular: Positive for chest pain. Negative for palpitations and leg swelling.  Gastrointestinal: Positive for nausea. Negative for abdominal pain, diarrhea and vomiting.  Genitourinary: Negative for difficulty urinating and dysuria.  Musculoskeletal: Positive for back pain. Negative for joint swelling.  Skin: Negative for color change and rash.  Neurological: Negative for dizziness, light-headedness and headaches.  Hematological: Negative for adenopathy. Does not bruise/bleed easily.  Psychiatric/Behavioral: Negative for agitation and dysphoric mood.        Increased stress as outlined.         Objective:    Physical Exam  Constitutional: She is oriented to person, place, and time. She appears well-developed and well-nourished. No distress.  HENT:  Nose: Nose normal.  Mouth/Throat: Oropharynx is clear and moist.  Eyes: Right eye exhibits no discharge. Left eye exhibits no discharge. No scleral icterus.  Neck: Neck supple. No thyromegaly present.  Cardiovascular: Normal rate and regular rhythm.   Pulmonary/Chest: Breath sounds normal. No accessory muscle usage. No tachypnea. No respiratory distress. She has no decreased breath sounds. She has no wheezes. She has no rhonchi. Right breast exhibits no inverted nipple, no mass, no nipple discharge and no tenderness (no axillary adenopathy). Left breast exhibits no inverted nipple, no mass, no nipple discharge and no tenderness (no axilarry adenopathy).  Some reproducible tenderness to palpation over her anterior chest.    Abdominal: Soft. Bowel sounds are normal. There is no tenderness.  Musculoskeletal: She exhibits no edema or tenderness.  Lymphadenopathy:    She has no cervical adenopathy.  Neurological: She is alert and oriented to person, place, and time.  Skin: Skin is warm. No rash noted. No erythema.  Psychiatric: She has a normal mood and affect. Her behavior is normal.    BP 110/74 (BP Location: Left Arm, Patient Position: Sitting, Cuff Size: Large)   Pulse 95   Temp 98.4 F (36.9 C) (Oral)   Resp 16   Ht 5' 5.75" (1.67 m)   Wt 214 lb (97.1 kg)   LMP 11/06/2000   SpO2 99%   BMI 34.80 kg/m  Wt Readings from Last 3 Encounters:  09/12/16 214 lb (97.1 kg)  07/20/16 222 lb 3.2 oz (100.8 kg)  07/07/16 228 lb 6.4 oz (103.6 kg)     Lab Results  Component Value Date   WBC 6.4 04/12/2016   HGB 13.5 04/12/2016   HCT 40.3 04/12/2016   PLT 424 04/12/2016   GLUCOSE 92 09/09/2016   CHOL 218 (H) 09/09/2016   TRIG 72.0 09/09/2016   HDL 67.20 09/09/2016   LDLDIRECT 164.5  07/24/2013   LDLCALC 136 (H) 09/09/2016   ALT 15 09/09/2016   AST 13 09/09/2016   NA 140 09/09/2016   K 3.7 09/09/2016   CL 101 09/09/2016   CREATININE 0.72 09/09/2016   BUN 9 09/09/2016   CO2 33 (H) 09/09/2016   TSH 2.00 04/13/2016   INR 1.0 04/21/2014    Mm Digital Screening Bilateral  Result Date: 11/06/2015 CLINICAL DATA:  Screening. EXAM: DIGITAL SCREENING BILATERAL MAMMOGRAM WITH CAD COMPARISON:  Previous exam(s). ACR Breast Density Category b: There are scattered areas of fibroglandular density. FINDINGS: There are no findings suspicious for malignancy. Images were processed with CAD. IMPRESSION: No mammographic evidence of malignancy. A result letter of this screening mammogram will  be mailed directly to the patient. RECOMMENDATION: Screening mammogram in one year. (Code:SM-B-01Y) BI-RADS CATEGORY  1: Negative. Electronically Signed   By: Ammie Ferrier M.D.   On: 11/06/2015 08:09       Assessment & Plan:   Problem List Items Addressed This Visit    Back pain    Back pain as outlined.  Has had cough and some congestion.  Check cxr.  Treat as outlined.  Not reproducible on exam.  Treat with tizanidine.  Should help with sleep as well.  With chest discomfort as outlined.  EKG - SR with no acute ischemic change when compared to previous.  Tender with palpation.  Treat as outlined.  Check cxr.  Hold on further cardiac w/up.  Follow closely.  Get back in soon to reassess.  She knows to be evaluated if symptoms change or worsen.        Relevant Medications   tiZANidine (ZANAFLEX) 2 MG tablet   Other Relevant Orders   DG Chest 2 View (Completed)   Chest pain - Primary   Relevant Orders   EKG 12-Lead (Completed)   DG Chest 2 View (Completed)   Cough   Relevant Orders   DG Chest 2 View (Completed)   Environmental allergies    Congestion as outlined.  Saline nasal spray and nasacort nasal spray as outlined.  Robitussin as needed.  Check cxr.  Treat as outlined.  Get her back  in soon to reassess.        Esophageal reflux    Controlled on current regimen.        Essential hypertension, benign    Blood pressure under good control.  Continue same medication regimen.  Follow pressures.  Follow metabolic panel.        Health care maintenance    Physical today 09/12/16.  Colonoscopy 02/2008.  Mammogram 11/06/15 - Birads I.        MGUS (monoclonal gammopathy of unknown significance)    Followed by hematology.       Pure hypercholesterolemia    Has not been taking her medication.  Low cholesterol diet and exercise.  Follow lipid panel and liver function tests.        Stress    Increased stress in dealing with her husband's death.  Discussed with her today.  She has good support.  Does not feel needs any further intervention.  Follow.  Has xanax if needed.  Not taking.        Thrombocytosis ([place])    Being followed by hematology.           Einar Pheasant, MD

## 2016-09-12 NOTE — Patient Instructions (Signed)
Saline nasal spray - flush nose at least 2-3x/day  nasacort nasal spray - 2 sprays each nostril one time per day.  Do this in the evening.    Robitussin DM twice a day as needed.  

## 2016-09-14 ENCOUNTER — Encounter: Payer: Self-pay | Admitting: Internal Medicine

## 2016-09-14 NOTE — Assessment & Plan Note (Signed)
Congestion as outlined.  Saline nasal spray and nasacort nasal spray as outlined.  Robitussin as needed.  Check cxr.  Treat as outlined.  Get her back in soon to reassess.

## 2016-09-14 NOTE — Assessment & Plan Note (Signed)
Back pain as outlined.  Has had cough and some congestion.  Check cxr.  Treat as outlined.  Not reproducible on exam.  Treat with tizanidine.  Should help with sleep as well.  With chest discomfort as outlined.  EKG - SR with no acute ischemic change when compared to previous.  Tender with palpation.  Treat as outlined.  Check cxr.  Hold on further cardiac w/up.  Follow closely.  Get back in soon to reassess.  She knows to be evaluated if symptoms change or worsen.

## 2016-09-14 NOTE — Assessment & Plan Note (Signed)
Being followed by hematology.   

## 2016-09-14 NOTE — Assessment & Plan Note (Signed)
Has not been taking her medication.  Low cholesterol diet and exercise.  Follow lipid panel and liver function tests.

## 2016-09-14 NOTE — Assessment & Plan Note (Signed)
Blood pressure under good control.  Continue same medication regimen.  Follow pressures.  Follow metabolic panel.   

## 2016-09-14 NOTE — Assessment & Plan Note (Signed)
Followed by hematology 

## 2016-09-14 NOTE — Assessment & Plan Note (Signed)
Increased stress in dealing with her husband's death.  Discussed with her today.  She has good support.  Does not feel needs any further intervention.  Follow.  Has xanax if needed.  Not taking.

## 2016-09-14 NOTE — Assessment & Plan Note (Signed)
Controlled on current regimen.   

## 2016-09-28 ENCOUNTER — Other Ambulatory Visit: Payer: Self-pay | Admitting: Internal Medicine

## 2016-09-28 DIAGNOSIS — Z1231 Encounter for screening mammogram for malignant neoplasm of breast: Secondary | ICD-10-CM

## 2016-10-09 ENCOUNTER — Other Ambulatory Visit: Payer: Self-pay | Admitting: Internal Medicine

## 2016-10-10 ENCOUNTER — Ambulatory Visit: Payer: BC Managed Care – PPO | Admitting: Internal Medicine

## 2016-10-10 NOTE — Telephone Encounter (Signed)
Okay to refill? 

## 2016-10-24 ENCOUNTER — Ambulatory Visit (INDEPENDENT_AMBULATORY_CARE_PROVIDER_SITE_OTHER): Payer: BC Managed Care – PPO | Admitting: Internal Medicine

## 2016-10-24 ENCOUNTER — Encounter: Payer: Self-pay | Admitting: Internal Medicine

## 2016-10-24 VITALS — BP 110/74 | HR 77 | Temp 98.6°F | Resp 16 | Ht 66.0 in | Wt 212.8 lb

## 2016-10-24 DIAGNOSIS — E7800 Pure hypercholesterolemia, unspecified: Secondary | ICD-10-CM

## 2016-10-24 DIAGNOSIS — K219 Gastro-esophageal reflux disease without esophagitis: Secondary | ICD-10-CM

## 2016-10-24 DIAGNOSIS — R079 Chest pain, unspecified: Secondary | ICD-10-CM

## 2016-10-24 DIAGNOSIS — F439 Reaction to severe stress, unspecified: Secondary | ICD-10-CM

## 2016-10-24 DIAGNOSIS — I10 Essential (primary) hypertension: Secondary | ICD-10-CM | POA: Diagnosis not present

## 2016-10-24 DIAGNOSIS — D472 Monoclonal gammopathy: Secondary | ICD-10-CM

## 2016-10-24 NOTE — Progress Notes (Signed)
Pre-visit discussion using our clinic review tool. No additional management support is needed unless otherwise documented below in the visit note.  

## 2016-10-24 NOTE — Progress Notes (Signed)
Patient ID: Kathleen Gould, female   DOB: Jan 10, 1966, 51 y.o.   MRN: [id number]   Subjective:    Patient ID: Kathleen Gould, female    DOB: October 11, 1965, 51 y.o.   MRN: [id number]  HPI  Patient here for a scheduled follow up.  She reports she has noticed some chest discomfort.  Increased stress.  She is still trying to cope with her husband's unexpected death.  She was questioning if some of her symptoms could be related to increased stress.  Intermittent chest discomfort.  Some increased sob.  Some bilateral numbness - noticed at night or early am - with sleeping.  Resolves once moves her hands.  She has had issues with carpal tunnel previously.  No acid reflux.  No abdominal pain.  Bowels stable.     Past Medical History:  Diagnosis Date  . Arthritis   . Endometriosis   . Environmental allergies   . GERD (gastroesophageal reflux disease)   . Hypercholesterolemia   . Hypertension   . Uterine fibroid    Past Surgical History:  Procedure Laterality Date  . ABDOMINAL HYSTERECTOMY    . CESAREAN SECTION     x2  . OOPHORECTOMY     S/P left secondary to cyst  . THYROGLOSSAL DUCT CYST N/A 05/27/2015   Procedure: THYROGLOSSAL DUCT CYST EXCISION ;  Surgeon: Clyde Canterbury, MD;  Location: [facility] ORS;  Service: ENT;  Laterality: N/A;   Family History  Problem Relation Age of Onset  . Heart attack Father 67  . Heart disease Father     bypass surgery  . Hypertension Father   . Hypercholesterolemia Father   . Kidney disease Father   . Diabetes Mother   . Diabetes      aunt  . Breast cancer Neg Hx    Social History   Social History  . Marital status: Married    Spouse name: N/A  . Number of children: 2  . Years of education: N/A   Social History Main Topics  . Smoking status: Never Smoker  . Smokeless tobacco: Never Used  . Alcohol use No  . Drug use: No  . Sexual activity: Not Asked   Other Topics Concern  . None   Social History Narrative  . None    Outpatient  Encounter Prescriptions as of 10/24/2016  Medication Sig  . ALPRAZolam (XANAX) 0.25 MG tablet Take 1 tablet (0.25 mg total) by mouth 2 (two) times daily as needed for anxiety.  Marland Kitchen atorvastatin (LIPITOR) 20 MG tablet Take 1 tablet (20 mg total) by mouth daily.  . felodipine (PLENDIL) 5 MG 24 hr tablet TAKE 1 TABLET (5 MG TOTAL) BY MOUTH DAILY.  . fluticasone (FLONASE) 50 MCG/ACT nasal spray Place 2 sprays into both nostrils daily.  [name] Take 1 capsule by mouth daily.   Marland Kitchen tiZANidine (ZANAFLEX) 2 MG tablet TAKE 1 TABLET (2 MG TOTAL) BY MOUTH AT BEDTIME AS NEEDED FOR MUSCLE SPASMS.  Marland Kitchen triamterene-hydrochlorothiazide (MAXZIDE-25) 37.5-25 MG tablet TAKE 0.5 TABLETS BY MOUTH DAILY.   No facility-administered encounter medications on file as of 10/24/2016.     Review of Systems  Constitutional: Positive for fatigue.       She is eating.  Some decreased appetite.    HENT: Negative for congestion and sinus pressure.   Respiratory: Positive for shortness of breath. Negative for cough and chest tightness.   Cardiovascular: Positive for chest pain. Negative for palpitations and leg swelling.  Gastrointestinal: Negative  for abdominal pain, diarrhea, nausea and vomiting.  Genitourinary: Negative for difficulty urinating and dysuria.  Musculoskeletal: Negative for back pain and joint swelling.  Skin: Negative for color change and rash.  Neurological: Negative for headaches.       Occasional light headedness when stands from sitting position.    Psychiatric/Behavioral: Negative for agitation.       Increased stress as outlined.         Objective:    Physical Exam  Constitutional: She appears well-developed and well-nourished. No distress.  HENT:  Nose: Nose normal.  Mouth/Throat: Oropharynx is clear and moist.  Neck: Neck supple. No thyromegaly present.  Cardiovascular: Normal rate and regular rhythm.   Pulmonary/Chest: Breath sounds normal. No  respiratory distress. She has no wheezes.  Abdominal: Soft. Bowel sounds are normal. There is no tenderness.  Musculoskeletal: She exhibits no edema or tenderness.  Lymphadenopathy:    She has no cervical adenopathy.  Skin: No rash noted. No erythema.  Psychiatric: She has a normal mood and affect. Her behavior is normal.  Increased stress as outlined.      BP 110/74 (BP Location: Left Arm, Patient Position: Sitting, Cuff Size: Large)   Pulse 77   Temp 98.6 F (37 C) (Oral)   Resp 16   Ht 5\' 6"  (1.676 m)   Wt 212 lb 12.8 oz (96.5 kg)   LMP 11/06/2000   SpO2 99%   BMI 34.35 kg/m  Wt Readings from Last 3 Encounters:  10/24/16 212 lb 12.8 oz (96.5 kg)  09/12/16 214 lb (97.1 kg)  07/20/16 222 lb 3.2 oz (100.8 kg)     Lab Results  Component Value Date   WBC 6.4 04/12/2016   HGB 13.5 04/12/2016   HCT 40.3 04/12/2016   PLT 424 04/12/2016   GLUCOSE 92 09/09/2016   CHOL 218 (H) 09/09/2016   TRIG 72.0 09/09/2016   HDL 67.20 09/09/2016   LDLDIRECT 164.5 07/24/2013   LDLCALC 136 (H) 09/09/2016   ALT 15 09/09/2016   AST 13 09/09/2016   NA 140 09/09/2016   K 3.7 09/09/2016   CL 101 09/09/2016   CREATININE 0.72 09/09/2016   BUN 9 09/09/2016   CO2 33 (H) 09/09/2016   TSH 2.00 04/13/2016   INR 1.0 04/21/2014    Mm Digital Screening Bilateral  Result Date: 11/06/2015 CLINICAL DATA:  Screening. EXAM: DIGITAL SCREENING BILATERAL MAMMOGRAM WITH CAD COMPARISON:  Previous exam(s). ACR Breast Density Category b: There are scattered areas of fibroglandular density. FINDINGS: There are no findings suspicious for malignancy. Images were processed with CAD. IMPRESSION: No mammographic evidence of malignancy. A result letter of this screening mammogram will be mailed directly to the patient. RECOMMENDATION: Screening mammogram in one year. (Code:SM-B-01Y) BI-RADS CATEGORY  1: Negative. Electronically Signed   By: Ammie Ferrier M.D.   On: 11/06/2015 08:09       Assessment & Plan:    Problem List Items Addressed This Visit    Chest pain - Primary    Chest pain and sob with exertion as outlined.  EKG - SR with no acute ischemic changes.  Discussed stress.  Given risk factors and symptoms, will have cardiology evaluate with question of need for further w/up and evaluation.        Relevant Orders   EKG 12-Lead (Completed)   Esophageal reflux    Controlled on omprazole.        Essential hypertension, benign    Blood pressure under good control.  Continue same medication  regimen.  Follow pressures.  Follow metabolic panel.        MGUS (monoclonal gammopathy of unknown significance)    Followed by hematology.       Pure hypercholesterolemia    Follow lipid panel.        Stress    Increased stress as outlined.  Discussed with her today.  Discussed grief counseling.  She does not feel needs anything more at this time.  Follow.            Einar Pheasant, MD

## 2016-10-26 NOTE — Assessment & Plan Note (Signed)
Increased stress as outlined.  Discussed with her today.  Discussed grief counseling.  She does not feel needs anything more at this time.  Follow.

## 2016-10-26 NOTE — Assessment & Plan Note (Signed)
Chest pain and sob with exertion as outlined.  EKG - SR with no acute ischemic changes.  Discussed stress.  Given risk factors and symptoms, will have cardiology evaluate with question of need for further w/up and evaluation.

## 2016-10-26 NOTE — Assessment & Plan Note (Signed)
Followed by hematology 

## 2016-10-26 NOTE — Assessment & Plan Note (Signed)
Follow lipid panel.   

## 2016-10-26 NOTE — Assessment & Plan Note (Signed)
Blood pressure under good control.  Continue same medication regimen.  Follow pressures.  Follow metabolic panel.   

## 2016-10-26 NOTE — Assessment & Plan Note (Signed)
Controlled on omprazole.   

## 2016-11-07 ENCOUNTER — Ambulatory Visit: Admission: RE | Admit: 2016-11-07 | Payer: BC Managed Care – PPO | Source: Ambulatory Visit

## 2016-11-15 ENCOUNTER — Ambulatory Visit
Admission: RE | Admit: 2016-11-15 | Discharge: 2016-11-15 | Disposition: A | Discharge status: Home / Self Care | Payer: BC Managed Care – PPO | Source: Ambulatory Visit | Attending: Internal Medicine | Admitting: Internal Medicine

## 2016-11-15 ENCOUNTER — Inpatient Hospital Stay: Payer: BC Managed Care – PPO | Attending: Internal Medicine

## 2016-11-15 ENCOUNTER — Inpatient Hospital Stay (HOSPITAL_BASED_OUTPATIENT_CLINIC_OR_DEPARTMENT_OTHER): Payer: BC Managed Care – PPO | Admitting: Internal Medicine

## 2016-11-15 VITALS — BP 116/76 | HR 82 | Temp 97.4°F | Resp 18 | Wt 214.2 lb

## 2016-11-15 DIAGNOSIS — Z79899 Other long term (current) drug therapy: Secondary | ICD-10-CM | POA: Insufficient documentation

## 2016-11-15 DIAGNOSIS — D472 Monoclonal gammopathy: Secondary | ICD-10-CM | POA: Insufficient documentation

## 2016-11-15 DIAGNOSIS — I10 Essential (primary) hypertension: Secondary | ICD-10-CM | POA: Diagnosis not present

## 2016-11-15 DIAGNOSIS — J209 Acute bronchitis, unspecified: Secondary | ICD-10-CM | POA: Insufficient documentation

## 2016-11-15 DIAGNOSIS — E7800 Pure hypercholesterolemia, unspecified: Secondary | ICD-10-CM | POA: Insufficient documentation

## 2016-11-15 DIAGNOSIS — Z1231 Encounter for screening mammogram for malignant neoplasm of breast: Secondary | ICD-10-CM | POA: Diagnosis present

## 2016-11-15 DIAGNOSIS — K219 Gastro-esophageal reflux disease without esophagitis: Secondary | ICD-10-CM | POA: Insufficient documentation

## 2016-11-15 LAB — CBC WITH DIFFERENTIAL/PLATELET
Basophils Absolute: 0.1 10*3/uL (ref 0–0.1)
Basophils Relative: 1 %
EOS ABS: 0.1 10*3/uL (ref 0–0.7)
EOS PCT: 2 %
HEMATOCRIT: 39.7 % (ref 35.0–47.0)
Hemoglobin: 13.3 g/dL (ref 12.0–16.0)
LYMPHS ABS: 1.9 10*3/uL (ref 1.0–3.6)
LYMPHS PCT: 33 %
MCH: 28 pg (ref 26.0–34.0)
MCHC: 33.4 g/dL (ref 32.0–36.0)
MCV: 83.9 fL (ref 80.0–100.0)
MONOS PCT: 6 %
Monocytes Absolute: 0.3 10*3/uL (ref 0.2–0.9)
Neutro Abs: 3.4 10*3/uL (ref 1.4–6.5)
Neutrophils Relative %: 58 %
PLATELETS: 428 10*3/uL (ref 150–440)
RBC: 4.74 MIL/uL (ref 3.80–5.20)
RDW: 14.6 % — AB (ref 11.5–14.5)
WBC: 5.9 10*3/uL (ref 3.6–11.0)

## 2016-11-15 LAB — COMPREHENSIVE METABOLIC PANEL
ALT: 17 U/L (ref 14–54)
AST: 16 U/L (ref 15–41)
Albumin: 4.3 g/dL (ref 3.5–5.0)
Alkaline Phosphatase: 77 U/L (ref 38–126)
Anion gap: 9 (ref 5–15)
BILIRUBIN TOTAL: 0.7 mg/dL (ref 0.3–1.2)
BUN: 12 mg/dL (ref 6–20)
CHLORIDE: 103 mmol/L (ref 101–111)
CO2: 27 mmol/L (ref 22–32)
CREATININE: 0.65 mg/dL (ref 0.44–1.00)
Calcium: 9.6 mg/dL (ref 8.9–10.3)
Glucose, Bld: 98 mg/dL (ref 65–99)
POTASSIUM: 3.7 mmol/L (ref 3.5–5.1)
Sodium: 139 mmol/L (ref 135–145)
TOTAL PROTEIN: 8.2 g/dL — AB (ref 6.5–8.1)

## 2016-11-15 NOTE — Assessment & Plan Note (Addendum)
Asymptomatic. Patient's M protein in March 2017 0.3 g/dL normal kappa Lambda light chain ratio. CBC CMP today are normal. I lambda light chain monoclonal protein pending.  # Acute bronchitis- on doxycycline as per PCP. Recent chest x-ray negative. Recommend when necessary NSAIDs as needed.   # I discussed with the patient at length regarding the small risk of converting into multiple myeloma. Recommend follow-up on a yearly basis.  # Recommend follow-up in spring of 2018/ CBC CMP myeloma panel. She'll call us sooner if needed.

## 2016-11-15 NOTE — Progress Notes (Signed)
[place] OFFICE PROGRESS NOTE  Patient Care Team: Einar Pheasant, MD as PCP - General (Internal Medicine)  Cancer Staging No matching staging information was found for the patient.  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 Serum LDH, Cr, CBC, Flow Cytometry, Skeletal survey all unremarkable.    No history exists.     INTERVAL HISTORY:  Kathleen Gould 51 y.o.  female pleasant patient above history of MGUS is here for follow-up.   More recently patient got diagnosed with acute bronchitis. She is having episodes of cough no significant sputum or hemoptysis. Low-grade fevers. Shortness of breath with exertion and wheezing. Pleuritic chest pain present. Started on antibiotics by PCP.  Patient denies any tingling and numbness. Denies any weight loss or unusual bone pain. No nausea no vomiting no weight loss.   REVIEW OF SYSTEMS:  A complete 10 point review of system is done which is negative except mentioned above/history of present illness.   PAST MEDICAL HISTORY :  Past Medical History:  Diagnosis Date  . Arthritis   . Endometriosis   . Environmental allergies   . GERD (gastroesophageal reflux disease)   . Hypercholesterolemia   . Hypertension   . Uterine fibroid     PAST SURGICAL HISTORY :   Past Surgical History:  Procedure Laterality Date  . ABDOMINAL HYSTERECTOMY    . CESAREAN SECTION     x2  . OOPHORECTOMY     S/P left secondary to cyst  . THYROGLOSSAL DUCT CYST N/A 05/27/2015   Procedure: THYROGLOSSAL DUCT CYST EXCISION ;  Surgeon: Clyde Canterbury, MD;  Location: [facility] ORS;  Service: ENT;  Laterality: N/A;    FAMILY HISTORY :   Family History  Problem Relation Age of Onset  . Heart attack Father 82  . Heart disease Father     bypass surgery  . Hypertension Father   . Hypercholesterolemia Father   . Kidney disease Father    . Diabetes Mother   . Diabetes      aunt  . Breast cancer Neg Hx     SOCIAL HISTORY:   Social History  Substance Use Topics  . Smoking status: Never Smoker  . Smokeless tobacco: Never Used  . Alcohol use No    ALLERGIES:  is allergic to cefdinir; levofloxacin; and simvastatin.  MEDICATIONS:  Current Outpatient Prescriptions  Medication Sig Dispense Refill  . ALPRAZolam (XANAX) 0.25 MG tablet Take 1 tablet (0.25 mg total) by mouth 2 (two) times daily as needed for anxiety. 30 tablet 0  . atorvastatin (LIPITOR) 20 MG tablet Take 1 tablet (20 mg total) by mouth daily. 30 tablet 5  . doxycycline (VIBRAMYCIN) 100 MG capsule Take 100 mg by mouth 2 (two) times daily.    . felodipine (PLENDIL) 5 MG 24 hr tablet TAKE 1 TABLET (5 MG TOTAL) BY MOUTH DAILY. 90 tablet 1  . fluticasone (FLONASE) 50 MCG/ACT nasal spray Place 2 sprays into both nostrils daily. 16 g 5  . Omeprazole-Sodium Bicarbonate (ZEGERID OTC PO) Take 1 capsule by mouth daily.     Marland Kitchen triamterene-hydrochlorothiazide (MAXZIDE-25) 37.5-25 MG tablet TAKE 0.5 TABLETS BY MOUTH DAILY. 30 tablet 5   No current facility-administered medications for this visit.     PHYSICAL EXAMINATION: ECOG PERFORMANCE STATUS: 0 - Asymptomatic  BP 116/76 (BP Location: Left Arm, Patient Position: Sitting)   Pulse 82   Temp 97.4 F (36.3 C) (  Tympanic)   Resp 18   Wt 214 lb 2.8 oz (97.1 kg)   LMP 11/06/2000   BMI 34.57 kg/m   Filed Weights   11/15/16 1430  Weight: 214 lb 2.8 oz (97.1 kg)    GENERAL: Well-nourished well-developed; Alert, no distress and comfortable.  Alone.  EYES: no pallor or icterus OROPHARYNX: no thrush or ulceration; good dentition  NECK: supple, no masses felt LYMPH:  no palpable lymphadenopathy in the cervical, axillary or inguinal regions LUNGS: clear to auscultation and  No wheeze or crackles HEART/CVS: regular rate & rhythm and no murmurs; No lower extremity edema ABDOMEN:abdomen soft, non-tender and normal  bowel sounds Musculoskeletal:no cyanosis of digits and no clubbing  PSYCH: alert & oriented x 3 with fluent speech NEURO: no focal motor/sensory deficits SKIN:  no rashes or significant lesions  LABORATORY DATA:  I have reviewed the data as listed    Component Value Date/Time   NA 139 11/15/2016 1407   K 3.7 11/15/2016 1407   K 3.9 05/27/2015 0615   CL 103 11/15/2016 1407   CO2 27 11/15/2016 1407   GLUCOSE 98 11/15/2016 1407   BUN 12 11/15/2016 1407   CREATININE 0.65 11/15/2016 1407   CREATININE 0.81 04/21/2014 1629   CALCIUM 9.6 11/15/2016 1407   CALCIUM 9.3 04/21/2014 1629   PROT 8.2 (H) 11/15/2016 1407   ALBUMIN 4.3 11/15/2016 1407   AST 16 11/15/2016 1407   ALT 17 11/15/2016 1407   ALKPHOS 77 11/15/2016 1407   BILITOT 0.7 11/15/2016 1407   GFRNONAA >60 11/15/2016 1407   GFRNONAA >60 04/21/2014 1629   GFRAA >60 11/15/2016 1407   GFRAA >60 04/21/2014 1629    No results found for: SPEP, UPEP  Lab Results  Component Value Date   WBC 5.9 11/15/2016   NEUTROABS 3.4 11/15/2016   HGB 13.3 11/15/2016   HCT 39.7 11/15/2016   MCV 83.9 11/15/2016   PLT 428 11/15/2016      Chemistry      Component Value Date/Time   NA 139 11/15/2016 1407   K 3.7 11/15/2016 1407   K 3.9 05/27/2015 0615   CL 103 11/15/2016 1407   CO2 27 11/15/2016 1407   BUN 12 11/15/2016 1407   CREATININE 0.65 11/15/2016 1407   CREATININE 0.81 04/21/2014 1629      Component Value Date/Time   CALCIUM 9.6 11/15/2016 1407   CALCIUM 9.3 04/21/2014 1629   ALKPHOS 77 11/15/2016 1407   AST 16 11/15/2016 1407   ALT 17 11/15/2016 1407   BILITOT 0.7 11/15/2016 1407       RADIOGRAPHIC STUDIES: I have personally reviewed the radiological images as listed and agreed with the findings in the report. No results found.   ASSESSMENT & PLAN:  MGUS (monoclonal gammopathy of unknown significance) Asymptomatic. Patient's M protein in March 2017 0.3 g/dL normal kappa Lambda light chain ratio. CBC CMP today  are normal. I lambda light chain monoclonal protein pending.  # Acute bronchitis- on doxycycline as per PCP. Recent chest x-ray negative. Recommend when necessary NSAIDs as needed.   # I discussed with the patient at length regarding the small risk of converting into multiple myeloma. Recommend follow-up on a yearly basis.  # Recommend follow-up in spring of 2018/ CBC CMP myeloma panel. She'll call us sooner if needed.   Orders Placed This Encounter  Procedures  . CBC with Differential/Platelet    Standing Status:   Future    Standing Expiration Date:   05/18/2018  .  Comprehensive metabolic panel    Standing Status:   Future    Standing Expiration Date:   05/18/2018  . Kappa/lambda light chains    Standing Status:   Future    Standing Expiration Date:   05/18/2018  . Multiple Myeloma Panel (SPEP&IFE w/QIG)    Standing Status:   Future    Standing Expiration Date:   05/18/2018   All questions were answered. The patient knows to call the clinic with any problems, questions or concerns.      Cammie Sickle, MD 11/15/2016 2:43 PM

## 2016-11-15 NOTE — Progress Notes (Signed)
Patient here today for follow up.  Patient states no new concerns today  

## 2016-11-16 LAB — KAPPA/LAMBDA LIGHT CHAINS
Kappa free light chain: 14.5 mg/L (ref 3.3–19.4)
Kappa, lambda light chain ratio: 1.18 (ref 0.26–1.65)
Lambda free light chains: 12.3 mg/L (ref 5.7–26.3)

## 2016-11-17 LAB — MULTIPLE MYELOMA PANEL, SERUM
ALPHA2 GLOB SERPL ELPH-MCNC: 0.7 g/dL (ref 0.4–1.0)
Albumin SerPl Elph-Mcnc: 4 g/dL (ref 2.9–4.4)
Albumin/Glob SerPl: 1.2 (ref 0.7–1.7)
Alpha 1: 0.2 g/dL (ref 0.0–0.4)
B-GLOBULIN SERPL ELPH-MCNC: 1.3 g/dL (ref 0.7–1.3)
GLOBULIN, TOTAL: 3.6 g/dL (ref 2.2–3.9)
Gamma Glob SerPl Elph-Mcnc: 1.3 g/dL (ref 0.4–1.8)
IGG (IMMUNOGLOBIN G), SERUM: 1157 mg/dL (ref 700–1600)
IgA: 242 mg/dL (ref 87–352)
IgM, Serum: 123 mg/dL (ref 26–217)
M PROTEIN SERPL ELPH-MCNC: 0.3 g/dL — AB
TOTAL PROTEIN ELP: 7.6 g/dL (ref 6.0–8.5)

## 2016-11-22 ENCOUNTER — Encounter: Payer: Self-pay | Admitting: Family

## 2016-11-22 ENCOUNTER — Ambulatory Visit (INDEPENDENT_AMBULATORY_CARE_PROVIDER_SITE_OTHER): Payer: BC Managed Care – PPO | Admitting: Family

## 2016-11-22 VITALS — BP 138/80 | HR 83 | Temp 97.7°F | Ht 69.0 in | Wt 219.0 lb

## 2016-11-22 DIAGNOSIS — R300 Dysuria: Secondary | ICD-10-CM | POA: Diagnosis not present

## 2016-11-22 LAB — POCT URINALYSIS DIPSTICK
Bilirubin, UA: NEGATIVE
Blood, UA: NEGATIVE
GLUCOSE UA: NEGATIVE
LEUKOCYTES UA: NEGATIVE
NITRITE UA: NEGATIVE
SPEC GRAV UA: 1.015 (ref 1.030–1.035)
UROBILINOGEN UA: 0.2 (ref ?–2.0)
pH, UA: 6 (ref 5.0–8.0)

## 2016-11-22 NOTE — Progress Notes (Signed)
Subjective:    Patient ID: Kathleen Gould, female    DOB: 18-May-1966, 51 y.o.   MRN: [id number]  CC: Kathleen Gould is a 51 y.o. female who presents today for an acute visit.    HPI: CC: dysuria, urinary frequency x one week, improved. Tried azo with some relief. No flank pain fever, N, V.   No h/o renal stones.            HISTORY:  Past Medical History:  Diagnosis Date  . Arthritis   . Endometriosis   . Environmental allergies   . GERD (gastroesophageal reflux disease)   . Hypercholesterolemia   . Hypertension   . Uterine fibroid    Past Surgical History:  Procedure Laterality Date  . ABDOMINAL HYSTERECTOMY    . CESAREAN SECTION     x2  . OOPHORECTOMY     S/P left secondary to cyst  . THYROGLOSSAL DUCT CYST N/A 05/27/2015   Procedure: THYROGLOSSAL DUCT CYST EXCISION ;  Surgeon: Clyde Canterbury, MD;  Location: [facility] ORS;  Service: ENT;  Laterality: N/A;   Family History  Problem Relation Age of Onset  . Heart attack Father 24  . Heart disease Father     bypass surgery  . Hypertension Father   . Hypercholesterolemia Father   . Kidney disease Father   . Diabetes Mother   . Diabetes      aunt  . Breast cancer Neg Hx     Allergies: Cefdinir; Levofloxacin; and Simvastatin Current Outpatient Prescriptions on File Prior to Visit  Medication Sig Dispense Refill  . ALPRAZolam (XANAX) 0.25 MG tablet Take 1 tablet (0.25 mg total) by mouth 2 (two) times daily as needed for anxiety. 30 tablet 0  . atorvastatin (LIPITOR) 20 MG tablet Take 1 tablet (20 mg total) by mouth daily. 30 tablet 5  . felodipine (PLENDIL) 5 MG 24 hr tablet TAKE 1 TABLET (5 MG TOTAL) BY MOUTH DAILY. 90 tablet 1  . fluticasone (FLONASE) 50 MCG/ACT nasal spray Place 2 sprays into both nostrils daily. 16 g 5  . Omeprazole-Sodium Bicarbonate (ZEGERID OTC PO) Take 1 capsule by mouth daily.     Marland Kitchen triamterene-hydrochlorothiazide (MAXZIDE-25) 37.5-25 MG tablet TAKE 0.5 TABLETS BY MOUTH DAILY. 30  tablet 5   No current facility-administered medications on file prior to visit.     Social History  Substance Use Topics  . Smoking status: Never Smoker  . Smokeless tobacco: Never Used  . Alcohol use No    Review of Systems  Constitutional: Negative for chills and fever.  Respiratory: Negative for cough.   Cardiovascular: Negative for chest pain and palpitations.  Gastrointestinal: Negative for abdominal pain, nausea and vomiting.  Genitourinary: Positive for dysuria and frequency. Negative for flank pain.      Objective:    BP 138/80   Pulse 83   Temp 97.7 F (36.5 C) (Oral)   Ht 5\' 9"  (1.753 m)   Wt 219 lb (99.3 kg)   LMP 11/06/2000   SpO2 99%   BMI 32.34 kg/m    Physical Exam  Constitutional: She appears well-developed and well-nourished.  Cardiovascular: Normal rate, regular rhythm, normal heart sounds and normal pulses.   Pulmonary/Chest: Effort normal and breath sounds normal. She has no wheezes. She has no rhonchi. She has no rales.  Abdominal: There is no CVA tenderness.  Neurological: She is alert.  Skin: Skin is warm and dry.  Psychiatric: She has a normal mood and affect. Her speech is normal  and behavior is normal. Thought content normal.  Vitals reviewed.      Assessment & Plan:  1. Dysuria Afebrile. Trace ketones. No blood, leukocytes. Patient and I jointly agreed we would await urine culture prior to treatment. - POCT Urinalysis Dipstick - Urine Culture     I am having Ms. Wolpert maintain her Omeprazole-Sodium Bicarbonate (ZEGERID OTC PO), atorvastatin, felodipine, fluticasone, triamterene-hydrochlorothiazide, and ALPRAZolam.   No orders of the defined types were placed in this encounter.   Return precautions given.   Risks, benefits, and alternatives of the medications and treatment plan prescribed today were discussed, and patient expressed understanding.   Education regarding symptom management and diagnosis given to patient on  AVS.  Continue to follow with Einar Pheasant, MD for routine health maintenance.   Megan Salon Hail and I agreed with plan.   Mable Paris, FNP

## 2016-11-22 NOTE — Patient Instructions (Signed)
Plenty of water.  Lets await urine culture

## 2016-11-22 NOTE — Progress Notes (Signed)
Pre visit review using our clinic review tool, if applicable. No additional management support is needed unless otherwise documented below in the visit note. 

## 2016-11-23 ENCOUNTER — Encounter: Payer: Self-pay | Admitting: Family

## 2016-11-23 LAB — URINE CULTURE: Organism ID, Bacteria: NO GROWTH

## 2016-11-26 ENCOUNTER — Other Ambulatory Visit: Payer: Self-pay | Admitting: Internal Medicine

## 2016-12-01 ENCOUNTER — Encounter: Payer: Self-pay | Admitting: Internal Medicine

## 2016-12-01 ENCOUNTER — Ambulatory Visit (INDEPENDENT_AMBULATORY_CARE_PROVIDER_SITE_OTHER): Payer: BC Managed Care – PPO | Admitting: Internal Medicine

## 2016-12-01 VITALS — BP 126/78 | HR 76 | Temp 98.6°F | Resp 12 | Ht 69.0 in | Wt 215.4 lb

## 2016-12-01 DIAGNOSIS — D472 Monoclonal gammopathy: Secondary | ICD-10-CM

## 2016-12-01 DIAGNOSIS — D75839 Thrombocytosis, unspecified: Secondary | ICD-10-CM

## 2016-12-01 DIAGNOSIS — D473 Essential (hemorrhagic) thrombocythemia: Secondary | ICD-10-CM

## 2016-12-01 DIAGNOSIS — E7800 Pure hypercholesterolemia, unspecified: Secondary | ICD-10-CM | POA: Diagnosis not present

## 2016-12-01 DIAGNOSIS — R0789 Other chest pain: Secondary | ICD-10-CM | POA: Diagnosis not present

## 2016-12-01 DIAGNOSIS — F439 Reaction to severe stress, unspecified: Secondary | ICD-10-CM | POA: Diagnosis not present

## 2016-12-01 DIAGNOSIS — I10 Essential (primary) hypertension: Secondary | ICD-10-CM | POA: Diagnosis not present

## 2016-12-01 LAB — LIPID PANEL
CHOL/HDL RATIO: 3
CHOLESTEROL: 212 mg/dL — AB (ref 0–200)
HDL: 69.5 mg/dL (ref >39.00)
LDL Cholesterol: 128 mg/dL — ABNORMAL HIGH (ref 0–99)
NonHDL: 142.27
TRIGLYCERIDES: 71 mg/dL (ref 0.0–149.0)
VLDL: 14.2 mg/dL (ref 0.0–40.0)

## 2016-12-01 MED ORDER — ATORVASTATIN CALCIUM 20 MG PO TABS: 20.0000 mg | ORAL_TABLET | Freq: Every day | ORAL | 5 refills | Status: DC

## 2016-12-01 MED ORDER — FLUTICASONE PROPIONATE 50 MCG/ACT NA SUSP: 2.0000 | Freq: Every day | NASAL | 5 refills | Status: DC

## 2016-12-01 MED ORDER — TRIAMTERENE-HCTZ 37.5-25 MG PO TABS: ORAL_TABLET | ORAL | 5 refills | Status: DC

## 2016-12-01 MED ORDER — FELODIPINE ER 5 MG PO TB24: ORAL_TABLET | ORAL | 5 refills | Status: DC

## 2016-12-01 NOTE — Progress Notes (Signed)
Patient ID: Kathleen Gould, female   DOB: [birth year], 51 y.o.   MRN: [id number]   Subjective:    Patient ID: Kathleen Gould, female    DOB: 05/29/1966, 51 y.o.   MRN: [id number]  HPI  Patient here for a scheduled follow up.  She reports she is doing better.  Getting out more.  Has good support.  Sleeping better.  Does not feel needs anything more at this time.  No chest pain.  Recent cardiac w/up unrevealing.  She feels better.  No acid reflux.  No abdominal pain.  Bowels moving.     Past Medical History:  Diagnosis Date  . Arthritis   . Endometriosis   . Environmental allergies   . GERD (gastroesophageal reflux disease)   . Hypercholesterolemia   . Hypertension   . Uterine fibroid    Past Surgical History:  Procedure Laterality Date  . ABDOMINAL HYSTERECTOMY    . CESAREAN SECTION     x2  . OOPHORECTOMY     S/P left secondary to cyst  . THYROGLOSSAL DUCT CYST N/A 05/27/2015   Procedure: THYROGLOSSAL DUCT CYST EXCISION ;  Surgeon: Clyde Canterbury, MD;  Location: [facility] ORS;  Service: ENT;  Laterality: N/A;   Family History  Problem Relation Age of Onset  . Heart attack Father 93  . Heart disease Father     bypass surgery  . Hypertension Father   . Hypercholesterolemia Father   . Kidney disease Father   . Diabetes Mother   . Diabetes      aunt  . Breast cancer Neg Hx    Social History   Social History  . Marital status: Married    Spouse name: N/A  . Number of children: 2  . Years of education: N/A   Social History Main Topics  . Smoking status: Never Smoker  . Smokeless tobacco: Never Used  . Alcohol use No  . Drug use: No  . Sexual activity: Not Asked   Other Topics Concern  . None   Social History Narrative  . None    Outpatient Encounter Prescriptions as of 12/01/2016  Medication Sig  . atorvastatin (LIPITOR) 20 MG tablet Take 1 tablet (20 mg total) by mouth daily.  . felodipine (PLENDIL) 5 MG 24 hr tablet TAKE 1 TABLET (5 MG TOTAL) BY MOUTH  DAILY.  . fluticasone (FLONASE) 50 MCG/ACT nasal spray Place 2 sprays into both nostrils daily.  [name] Take 1 capsule by mouth daily.   Marland Kitchen triamterene-hydrochlorothiazide (MAXZIDE-25) 37.5-25 MG tablet TAKE 0.5 TABLETS BY MOUTH DAILY.  . [DISCONTINUED] atorvastatin (LIPITOR) 20 MG tablet Take 1 tablet (20 mg total) by mouth daily.  . [DISCONTINUED] felodipine (PLENDIL) 5 MG 24 hr tablet TAKE 1 TABLET (5 MG TOTAL) BY MOUTH DAILY.  . [DISCONTINUED] fluticasone (FLONASE) 50 MCG/ACT nasal spray Place 2 sprays into both nostrils daily.  . [DISCONTINUED] triamterene-hydrochlorothiazide (MAXZIDE-25) 37.5-25 MG tablet TAKE 0.5 TABLETS BY MOUTH DAILY.  Marland Kitchen ALPRAZolam (XANAX) 0.25 MG tablet Take 1 tablet (0.25 mg total) by mouth 2 (two) times daily as needed for anxiety. (Patient not taking: Reported on 12/01/2016)  . [EXPIRED] doxycycline (VIBRAMYCIN) 100 MG capsule Take 100 mg by mouth 2 (two) times daily.  . [DISCONTINUED] triamterene-hydrochlorothiazide (MAXZIDE-25) 37.5-25 MG tablet TAKE 0.5 TABLETS BY MOUTH DAILY.   No facility-administered encounter medications on file as of 12/01/2016.     Review of Systems  Constitutional: Negative for appetite change and unexpected weight change.  HENT:  Negative for congestion and sinus pressure.   Respiratory: Negative for cough, chest tightness and shortness of breath.   Cardiovascular: Negative for chest pain, palpitations and leg swelling.  Gastrointestinal: Negative for abdominal pain, diarrhea, nausea and vomiting.  Genitourinary: Negative for difficulty urinating and dysuria.  Musculoskeletal: Negative for back pain and joint swelling.  Skin: Negative for color change and rash.  Neurological: Negative for dizziness, light-headedness and headaches.  Psychiatric/Behavioral: Negative for agitation and dysphoric mood.       Objective:    Physical Exam  Constitutional: She appears well-developed and  well-nourished. No distress.  HENT:  Nose: Nose normal.  Mouth/Throat: Oropharynx is clear and moist.  Neck: Neck supple. No thyromegaly present.  Cardiovascular: Normal rate and regular rhythm.   Pulmonary/Chest: Breath sounds normal. No respiratory distress. She has no wheezes.  Abdominal: Soft. Bowel sounds are normal. There is no tenderness.  Musculoskeletal: She exhibits no edema or tenderness.  Lymphadenopathy:    She has no cervical adenopathy.  Skin: No rash noted. No erythema.  Psychiatric: She has a normal mood and affect. Her behavior is normal.    BP 126/78 (BP Location: Left Arm, Patient Position: Sitting, Cuff Size: Large)   Pulse 76   Temp 98.6 F (37 C) (Oral)   Resp 12   Ht 5\' 9"  (1.753 m)   Wt 215 lb 6.4 oz (97.7 kg)   LMP 11/06/2000   SpO2 98%   BMI 31.81 kg/m  Wt Readings from Last 3 Encounters:  12/01/16 215 lb 6.4 oz (97.7 kg)  11/22/16 219 lb (99.3 kg)  11/15/16 214 lb 2.8 oz (97.1 kg)     Lab Results  Component Value Date   WBC 5.9 11/15/2016   HGB 13.3 11/15/2016   HCT 39.7 11/15/2016   PLT 428 11/15/2016   GLUCOSE 98 11/15/2016   CHOL 212 (H) 12/01/2016   TRIG 71.0 12/01/2016   HDL 69.50 12/01/2016   LDLDIRECT 164.5 07/24/2013   LDLCALC 128 (H) 12/01/2016   ALT 17 11/15/2016   AST 16 11/15/2016   NA 139 11/15/2016   K 3.7 11/15/2016   CL 103 11/15/2016   CREATININE 0.65 11/15/2016   BUN 12 11/15/2016   CO2 27 11/15/2016   TSH 2.00 04/13/2016   INR 1.0 04/21/2014    Mm Digital Screening Bilateral  Result Date: 11/15/2016 CLINICAL DATA:  Screening. EXAM: DIGITAL SCREENING BILATERAL MAMMOGRAM WITH CAD COMPARISON:  Previous exam(s). ACR Breast Density Category b: There are scattered areas of fibroglandular density. FINDINGS: There are no findings suspicious for malignancy. Images were processed with CAD. IMPRESSION: No mammographic evidence of malignancy. A result letter of this screening mammogram will be mailed directly to the  patient. RECOMMENDATION: Screening mammogram in one year. (Code:SM-B-01Y) BI-RADS CATEGORY  1: Negative. Electronically Signed   By: Fidela Salisbury M.D.   On: 11/15/2016 16:57       Assessment & Plan:   Problem List Items Addressed This Visit    Chest pain    No chest pain now.  Had cardiac w/up.  Unrevealing.  Follow.       Essential hypertension, benign    Blood pressure under good control.  Continue same medication regimen.  Follow pressures.  Follow metabolic panel.        Relevant Medications   atorvastatin (LIPITOR) 20 MG tablet   felodipine (PLENDIL) 5 MG 24 hr tablet   triamterene-hydrochlorothiazide (MAXZIDE-25) 37.5-25 MG tablet   MGUS (monoclonal gammopathy of unknown significance)    Seeing  hematology.  Has been stable.  Last visit, recommended f/u in one year.        Pure hypercholesterolemia    Low cholesterol diet and exercise.  Follow lipid panel.       Relevant Medications   atorvastatin (LIPITOR) 20 MG tablet   felodipine (PLENDIL) 5 MG 24 hr tablet   triamterene-hydrochlorothiazide (MAXZIDE-25) 37.5-25 MG tablet   Stress    Discussed with her today.  She is doing better.  Does not feel needs anything more at this point.  Follow.       Thrombocytosis ([place])    Followed by hematology.         Other Visit Diagnoses    Hypercholesterolemia    -  Primary   Relevant Medications   atorvastatin (LIPITOR) 20 MG tablet   felodipine (PLENDIL) 5 MG 24 hr tablet   triamterene-hydrochlorothiazide (MAXZIDE-25) 37.5-25 MG tablet   Other Relevant Orders   Lipid panel (Completed)       Einar Pheasant, MD

## 2016-12-01 NOTE — Progress Notes (Signed)
Pre-visit discussion using our clinic review tool. No additional management support is needed unless otherwise documented below in the visit note.  

## 2016-12-02 ENCOUNTER — Encounter: Payer: Self-pay | Admitting: Internal Medicine

## 2016-12-02 NOTE — Assessment & Plan Note (Signed)
Followed by hematology 

## 2016-12-02 NOTE — Assessment & Plan Note (Signed)
No chest pain now.  Had cardiac w/up.  Unrevealing.  Follow.

## 2016-12-02 NOTE — Assessment & Plan Note (Signed)
Seeing hematology.  Has been stable.  Last visit, recommended f/u in one year.

## 2016-12-02 NOTE — Assessment & Plan Note (Signed)
Low cholesterol diet and exercise.  Follow lipid panel.   

## 2016-12-02 NOTE — Assessment & Plan Note (Signed)
Blood pressure under good control.  Continue same medication regimen.  Follow pressures.  Follow metabolic panel.   

## 2016-12-02 NOTE — Assessment & Plan Note (Signed)
Discussed with her today.  She is doing better.  Does not feel needs anything more at this point.  Follow.

## 2016-12-06 NOTE — Telephone Encounter (Signed)
Hard copy mailed to patient at home address

## 2016-12-14 ENCOUNTER — Ambulatory Visit (INDEPENDENT_AMBULATORY_CARE_PROVIDER_SITE_OTHER): Payer: BC Managed Care – PPO | Admitting: Family Medicine

## 2016-12-14 ENCOUNTER — Ambulatory Visit (INDEPENDENT_AMBULATORY_CARE_PROVIDER_SITE_OTHER): Payer: BC Managed Care – PPO

## 2016-12-14 ENCOUNTER — Encounter: Payer: Self-pay | Admitting: Family Medicine

## 2016-12-14 VITALS — BP 144/80 | HR 63 | Temp 98.1°F | Wt 216.5 lb

## 2016-12-14 DIAGNOSIS — M25561 Pain in right knee: Secondary | ICD-10-CM | POA: Diagnosis not present

## 2016-12-14 MED ORDER — PREDNISONE 10 MG PO TABS: ORAL_TABLET | ORAL | 0 refills | Status: DC

## 2016-12-14 NOTE — Progress Notes (Signed)
Subjective:  Patient ID: Kathleen Gould, female    DOB: [birth year]  Age: 51 y.o. MRN: [id number]  CC: Right knee pain  HPI:  51 year old Presents with complaints of right knee pain.  Right knee pain  Started at the beginning of last week.  Began after increasing physical activity. She's been walking on a track which she states is on an incline.  She has had severe right knee pain.  Located medially.  8/10 in severity. Worse with activity.  No known relieving factors.  She was recently seen at walk-in clinic and was told she had patellar tendinitis. She was given naproxen.  She states that her knee has been swollen as well.  She does not recall any fall, trauma, injury.  No other associated symptoms. No other complaints at this time.  Social Hx   Social History   Social History  . Marital status: Married    Spouse name: N/A  . Number of children: 2  . Years of education: N/A   Social History Main Topics  . Smoking status: Never Smoker  . Smokeless tobacco: Never Used  . Alcohol use No  . Drug use: No  . Sexual activity: Not Asked   Other Topics Concern  . None   Social History Narrative  . None    Review of Systems  Constitutional: Negative.   Musculoskeletal:       Right knee pain.   Objective:  BP (!) 144/80   Pulse 63   Temp 98.1 F (36.7 C) (Oral)   Wt 216 lb 8 oz (98.2 kg)   LMP 11/06/2000   SpO2 96%   BMI 31.97 kg/m   BP/Weight 12/14/2016 12/01/2016 0/25/4270  Systolic BP 623 762 831  Diastolic BP 80 78 80  Wt. (Lbs) 216.5 215.4 219  BMI 31.97 31.81 32.34   Physical Exam  Constitutional: She is oriented to person, place, and time. She appears well-developed. No distress.  HENT:  Head: Normocephalic and atraumatic.  Musculoskeletal:  Knee: Right Inspection - Effusion noted.  Palpation - Medial joint line tenderness.  Ligaments with solid consistent endpoints including ACL, PCL, LCL, MCL.   Neurological: She is alert and  oriented to person, place, and time.  Psychiatric: She has a normal mood and affect.  Vitals reviewed.  Lab Results  Component Value Date   WBC 5.9 11/15/2016   HGB 13.3 11/15/2016   HCT 39.7 11/15/2016   PLT 428 11/15/2016   GLUCOSE 98 11/15/2016   CHOL 212 (H) 12/01/2016   TRIG 71.0 12/01/2016   HDL 69.50 12/01/2016   LDLDIRECT 164.5 07/24/2013   LDLCALC 128 (H) 12/01/2016   ALT 17 11/15/2016   AST 16 11/15/2016   NA 139 11/15/2016   K 3.7 11/15/2016   CL 103 11/15/2016   CREATININE 0.65 11/15/2016   BUN 12 11/15/2016   CO2 27 11/15/2016   TSH 2.00 04/13/2016   INR 1.0 04/21/2014    Assessment & Plan:   Problem List Items Addressed This Visit    Acute pain of right knee - Primary    New problem. Obtaining x-ray to assess for underlying degenerative change. I suspect that this is secondary to her increase in activity on top of underlying osteoarthritis. It is possible that she has a medial meniscus tear. Treating with prednisone. If persist will arrange for sports medicine referral for ultrasound.      Relevant Orders   DG Knee Complete 4 Views Right  Meds ordered this encounter  Medications  . predniSONE (DELTASONE) 10 MG tablet    Sig: 50 mg (5 tablets) daily x 2 days, then 40 mg (4 tablets) daily x 2 days, then 30 mg (3 tablets) daily x 2 days, then 20 mg (2 tablets) daily x 2 days, then 10 mg (1 tablet) daily x 2 days.    Dispense:  30 tablet    Refill:  0   Follow-up: PRN  [place]

## 2016-12-14 NOTE — Progress Notes (Signed)
Pre visit review using our clinic review tool, if applicable. No additional management support is needed unless otherwise documented below in the visit note. 

## 2016-12-14 NOTE — Assessment & Plan Note (Signed)
New problem. Obtaining x-ray to assess for underlying degenerative change. I suspect that this is secondary to her increase in activity on top of underlying osteoarthritis. It is possible that she has a medial meniscus tear. Treating with prednisone. If persist will arrange for sports medicine referral for ultrasound.

## 2016-12-14 NOTE — Patient Instructions (Signed)
No naproxen.  Prednisone as prescribed.  We will call with xray results.  Let me know if you fail to improve/worsen.  Take care  Dr. Lacinda Axon

## 2016-12-28 ENCOUNTER — Other Ambulatory Visit: Payer: Self-pay | Admitting: Internal Medicine

## 2017-02-03 ENCOUNTER — Encounter: Payer: Self-pay | Admitting: *Deleted

## 2017-02-03 ENCOUNTER — Ambulatory Visit
Admission: EM | Admit: 2017-02-03 | Discharge: 2017-02-03 | Disposition: A | Discharge status: Home / Self Care | Payer: BC Managed Care – PPO | Attending: Family Medicine | Admitting: Family Medicine

## 2017-02-03 DIAGNOSIS — R05 Cough: Secondary | ICD-10-CM

## 2017-02-03 DIAGNOSIS — R059 Cough, unspecified: Secondary | ICD-10-CM

## 2017-02-03 MED ORDER — AZITHROMYCIN 250 MG PO TABS: ORAL_TABLET | ORAL | 0 refills | Status: DC

## 2017-02-03 NOTE — ED Triage Notes (Signed)
Patient has had symptom of cough for 3 weeks that has progressively worsened.

## 2017-02-03 NOTE — ED Provider Notes (Signed)
[facility]    CSN: [id number] Arrival date & time: 02/03/17  1454     History   Chief Complaint Chief Complaint  Patient presents with  . Cough    HPI Kathleen Gould is a 51 y.o. female.   The history is provided by the patient.  Cough  Cough characteristics:  Productive Associated symptoms: no headaches and no wheezing   URI  Presenting symptoms: cough and fatigue   Severity:  Moderate Onset quality:  Sudden Duration:  3 weeks Timing:  Constant Progression:  Worsening Chronicity:  New Relieved by:  Nothing Ineffective treatments:  OTC medications Associated symptoms: no headaches, no sinus pain and no wheezing   Risk factors: not elderly, no chronic cardiac disease, no chronic kidney disease, no chronic respiratory disease, no diabetes mellitus, no immunosuppression, no recent illness, no recent travel and no sick contacts     Past Medical History:  Diagnosis Date  . Arthritis   . Endometriosis   . Environmental allergies   . GERD (gastroesophageal reflux disease)   . Hypercholesterolemia   . Hypertension   . Uterine fibroid     Patient Active Problem List   Diagnosis Date Noted  . Acute pain of right knee 12/14/2016  . Stress 07/17/2016  . 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 Health care maintenance 10/29/2014  . MGUS (monoclonal gammopathy of unknown significance) 04/04/2014  . Thrombocytosis ([place]) 07/28/2013  . Environmental allergies 11/19/2012  . Essential hypertension, benign 11/05/2012  . Anemia 11/05/2012  . Esophageal reflux 11/05/2012  . Pure hypercholesterolemia 11/05/2012    Past Surgical History:  Procedure Laterality Date  . ABDOMINAL HYSTERECTOMY    . CESAREAN SECTION     x2  . OOPHORECTOMY     S/P left secondary to cyst  . THYROGLOSSAL DUCT CYST N/A 05/27/2015   Procedure: THYROGLOSSAL DUCT CYST EXCISION ;  Surgeon: Clyde Canterbury, MD;  Location: [facility] ORS;  Service: ENT;  Laterality: N/A;    OB History    No  data available       Home Medications    Prior to Admission medications   Medication Sig Start Date End Date Taking? Authorizing Provider  atorvastatin (LIPITOR) 20 MG tablet Take 1 tablet (20 mg total) by mouth daily. 12/01/16  Yes Einar Pheasant, MD  felodipine (PLENDIL) 5 MG 24 hr tablet TAKE 1 TABLET (5 MG TOTAL) BY MOUTH DAILY. 12/01/16  Yes Einar Pheasant, MD  fluticasone (FLONASE) 50 MCG/ACT nasal spray Place 2 sprays into both nostrils daily. 12/01/16  Yes Einar Pheasant, MD  Omeprazole-Sodium Bicarbonate (ZEGERID OTC PO) Take 1 capsule by mouth daily.    Yes [provider]  triamterene-hydrochlorothiazide (MAXZIDE-25) 37.5-25 MG tablet TAKE 0.5 TABLETS BY MOUTH DAILY. 12/01/16  Yes Einar Pheasant, MD  ALPRAZolam Duanne Moron) 0.25 MG tablet Take 1 tablet (0.25 mg total) by mouth 2 (two) times daily as needed for anxiety. Patient not taking: Reported on 12/01/2016 07/07/16   Einar Pheasant, MD  azithromycin (ZITHROMAX Z-PAK) 250 MG tablet 2 tabs po once day 1, then 1 tab po qd for next 4 days 02/03/17   Norval Gable, MD  felodipine (PLENDIL) 5 MG 24 hr tablet TAKE 1 TABLET (5 MG TOTAL) BY MOUTH DAILY. 12/28/16   Einar Pheasant, MD  predniSONE (DELTASONE) 10 MG tablet 50 mg (5 tablets) daily x 2 days, then 40 mg (4 tablets) daily x 2 days, then 30 mg (3 tablets) daily x 2 days, then 20 mg (2 tablets) daily x 2  days, then 10 mg (1 tablet) daily x 2 days. 12/14/16   Coral Spikes, DO    Family History Family History  Problem Relation Age of Onset  . Heart attack Father 29  . Heart disease Father        bypass surgery  . Hypertension Father   . Hypercholesterolemia Father   . Kidney disease Father   . Diabetes Mother   . Diabetes Unknown        aunt  . Breast cancer Neg Hx     Social History Social History  Substance Use Topics  . Smoking status: Never Smoker  . Smokeless tobacco: Never Used  . Alcohol use No     Allergies   Cefdinir; Levofloxacin; and  Simvastatin   Review of Systems Review of Systems  Constitutional: Positive for fatigue.  HENT: Negative for sinus pain.   Respiratory: Positive for cough. Negative for wheezing.   Neurological: Negative for headaches.     Physical Exam Triage Vital Signs ED Triage Vitals  Enc Vitals Group     BP 02/03/17 1509 134/78     Pulse Rate 02/03/17 1509 85     Resp 02/03/17 1509 16     Temp 02/03/17 1509 97.6 F (36.4 C)     Temp Source 02/03/17 1509 Oral     SpO2 02/03/17 1509 99 %     Weight 02/03/17 1510 200 lb (90.7 kg)     Height 02/03/17 1510 5' 5.5" (1.664 m)     Head Circumference --      Peak Flow --      Pain Score 02/03/17 1514 7     Pain Loc --      Pain Edu? --      Excl. in [place]? --    No data found.   Updated Vital Signs BP 134/78 (BP Location: Left Arm)   Pulse 85   Temp 97.6 F (36.4 C) (Oral)   Resp 16   Ht 5' 5.5" (1.664 m)   Wt 200 lb (90.7 kg)   LMP 11/06/2000   SpO2 99%   BMI 32.78 kg/m   Visual Acuity Right Eye Distance:   Left Eye Distance:   Bilateral Distance:    Right Eye Near:   Left Eye Near:    Bilateral Near:     Physical Exam  Constitutional: She appears well-developed and well-nourished. No distress.  HENT:  Head: Normocephalic and atraumatic.  Right Ear: Tympanic membrane, external ear and ear canal normal.  Left Ear: Tympanic membrane, external ear and ear canal normal.  Nose: No mucosal edema, rhinorrhea, nose lacerations, sinus tenderness, nasal deformity, septal deviation or nasal septal hematoma. No epistaxis.  No foreign bodies. Right sinus exhibits no maxillary sinus tenderness and no frontal sinus tenderness. Left sinus exhibits no maxillary sinus tenderness and no frontal sinus tenderness.  Mouth/Throat: Uvula is midline, oropharynx is clear and moist and mucous membranes are normal. No oropharyngeal exudate.  Eyes: Conjunctivae and EOM are normal. Pupils are equal, round, and reactive to light. Right eye exhibits no  discharge. Left eye exhibits no discharge. No scleral icterus.  Neck: Normal range of motion. Neck supple. No thyromegaly present.  Cardiovascular: Normal rate, regular rhythm and normal heart sounds.   Pulmonary/Chest: Effort normal. No respiratory distress. She has no wheezes. She has rales (right base).  Lymphadenopathy:    She has no cervical adenopathy.  Skin: She is not diaphoretic.  Nursing note and vitals reviewed.    UC  Treatments / Results  Labs (all labs ordered are listed, but only abnormal results are displayed) Labs Reviewed - No data to display  EKG  EKG Interpretation None       Radiology No results found.  Procedures Procedures (including critical care time)  Medications Ordered in UC Medications - No data to display   Initial Impression / Assessment and Plan / UC Course  I have reviewed the triage vital signs and the nursing notes.  Pertinent labs & imaging results that were available during my care of the patient were reviewed by me and considered in my medical decision making (see chart for details).      Final Clinical Impressions(s) / UC Diagnoses   Final diagnoses:  Cough    New Prescriptions Discharge Medication List as of 02/03/2017  3:45 PM    START taking these medications   Details  azithromycin (ZITHROMAX Z-PAK) 250 MG tablet 2 tabs po once day 1, then 1 tab po qd for next 4 days, Normal       1. diagnosis reviewed with patient 2. rx as per orders above; reviewed possible side effects, interactions, risks and benefits  3. Recommend supportive treatment with rest, fluids, otc cough medication 4. Follow-up prn if symptoms worsen or don't improve   Norval Gable, MD 02/03/17 1644

## 2017-02-08 ENCOUNTER — Encounter: Payer: Self-pay | Admitting: Family Medicine

## 2017-02-08 ENCOUNTER — Ambulatory Visit (INDEPENDENT_AMBULATORY_CARE_PROVIDER_SITE_OTHER): Payer: BC Managed Care – PPO

## 2017-02-08 ENCOUNTER — Ambulatory Visit (INDEPENDENT_AMBULATORY_CARE_PROVIDER_SITE_OTHER): Payer: BC Managed Care – PPO | Admitting: Family Medicine

## 2017-02-08 VITALS — BP 128/82 | HR 71 | Temp 98.6°F | Wt 213.8 lb

## 2017-02-08 DIAGNOSIS — R05 Cough: Secondary | ICD-10-CM | POA: Diagnosis not present

## 2017-02-08 DIAGNOSIS — R059 Cough, unspecified: Secondary | ICD-10-CM

## 2017-02-08 MED ORDER — DOXYCYCLINE HYCLATE 100 MG PO TABS: 100.0000 mg | ORAL_TABLET | Freq: Two times a day (BID) | ORAL | 0 refills | Status: DC

## 2017-02-08 MED ORDER — BENZONATATE 200 MG PO CAPS: 200.0000 mg | ORAL_CAPSULE | Freq: Two times a day (BID) | ORAL | 0 refills | Status: DC | PRN

## 2017-02-08 NOTE — Assessment & Plan Note (Signed)
Patient with cough of several weeks duration. Did not respond to azithromycin. Continues to have symptoms. Benign lung exam. Benign vitals. Will obtain a chest x-ray. We'll treat with Tessalon for cough. We'll determine what antibiotic to place her on based on her chest x-ray. She is given return precautions.

## 2017-02-08 NOTE — Patient Instructions (Signed)
Nice to meet you. I suspect you have bronchitis though given your persistent symptoms we will obtain a chest x-ray. We'll determine what antibiotics to place you on based on that. Please use the Tessalon for cough. If you develop shortness of breath, fevers, cough productive of blood, or any new or changing symptoms please seek medical attention.

## 2017-02-08 NOTE — Progress Notes (Addendum)
  Tommi Rumps, MD Phone: [phone]  Kathleen Gould is a 51 y.o. female who presents today for same-day visit.  Patient was seen last week at the urgent care for cough and diagnosed with bronchitis. She was placed on azithromycin. She notes her symptoms have not improved at all. She feels congested in her chest. She is coughing up mucus intermittently that varies from clear to yellow to green to a small amount of blood. She notes no gross hemoptysis. She's had no wheezing. No shortness of breath. No fevers. She does note she is sore in her chest. No chest pressure. She has not been able to sleep related to coughing. She reports no travel recently. No leg swelling.  PMH: nonsmoker.   ROS see history of present illness  Objective  Physical Exam Vitals:   02/08/17 0948  BP: 128/82  Pulse: 71  Temp: 98.6 F (37 C)    BP Readings from Last 3 Encounters:  02/08/17 128/82  02/03/17 134/78  12/14/16 (!) 144/80   Wt Readings from Last 3 Encounters:  02/08/17 213 lb 12.8 oz (97 kg)  02/03/17 200 lb (90.7 kg)  12/14/16 216 lb 8 oz (98.2 kg)    Physical Exam  Constitutional: No distress.  HENT:  Head: Normocephalic and atraumatic.  Mouth/Throat: Oropharynx is clear and moist. No oropharyngeal exudate.  Normal TMs  Eyes: Conjunctivae are normal. Pupils are equal, round, and reactive to light.  Cardiovascular: Normal rate, regular rhythm and normal heart sounds.   Pulmonary/Chest: Effort normal and breath sounds normal.  Musculoskeletal:  Lower extremities do not appear swollen  Neurological: She is alert. Gait normal.  Skin: She is not diaphoretic.     Assessment/Plan: Please see individual problem list.  Cough Patient with cough of several weeks duration. Did not respond to azithromycin. Continues to have symptoms. Benign lung exam. Benign vitals. Will obtain a chest x-ray. We'll treat with Tessalon for cough. We'll determine what antibiotic to place her on based  on her chest x-ray. She is given return precautions.  Addendum: Chest x-ray returned with no pneumonia. We'll place on doxycycline to cover for possible bronchitis.  Orders Placed This Encounter  Procedures  . DG Chest 2 View    Standing Status:   Future    Number of Occurrences:   1    Standing Expiration Date:   04/10/2018    Order Specific Question:   Reason for Exam (SYMPTOM  OR DIAGNOSIS REQUIRED)    Answer:   cough, chest congestion, small amount of blood with cough    Order Specific Question:   Is patient pregnant?    Answer:   No    Order Specific Question:   Preferred imaging location?    Answer:   [facility] Specific Question:   Radiology Contrast Protocol - do NOT remove file path    Answer:   [email or web address]    Meds ordered this encounter  Medications  . benzonatate (TESSALON) 200 MG capsule    Sig: Take 1 capsule (200 mg total) by mouth 2 (two) times daily as needed for cough.    Dispense:  20 capsule    Refill:  0   Tommi Rumps, MD [place]

## 2017-02-08 NOTE — Addendum Note (Signed)
Addended by: Caryl Bis, Trentan Trippe G on: 02/08/2017 11:22 AM   Modules accepted: Orders

## 2017-02-15 ENCOUNTER — Emergency Department
Admission: EM | Admit: 2017-02-15 | Discharge: 2017-02-15 | Disposition: A | Discharge status: Home / Self Care | Payer: BC Managed Care – PPO | Attending: Student in an Organized Health Care Education/Training Program | Admitting: Student in an Organized Health Care Education/Training Program

## 2017-02-15 ENCOUNTER — Emergency Department: Payer: BC Managed Care – PPO

## 2017-02-15 ENCOUNTER — Encounter: Payer: Self-pay | Admitting: Emergency Medicine

## 2017-02-15 DIAGNOSIS — X58XXXA Exposure to other specified factors, initial encounter: Secondary | ICD-10-CM | POA: Insufficient documentation

## 2017-02-15 DIAGNOSIS — Y9389 Activity, other specified: Secondary | ICD-10-CM | POA: Insufficient documentation

## 2017-02-15 DIAGNOSIS — J029 Acute pharyngitis, unspecified: Secondary | ICD-10-CM

## 2017-02-15 DIAGNOSIS — T18108A Unspecified foreign body in esophagus causing other injury, initial encounter: Secondary | ICD-10-CM | POA: Insufficient documentation

## 2017-02-15 DIAGNOSIS — T17900A Unspecified foreign body in respiratory tract, part unspecified causing asphyxiation, initial encounter: Secondary | ICD-10-CM

## 2017-02-15 DIAGNOSIS — R070 Pain in throat: Secondary | ICD-10-CM | POA: Insufficient documentation

## 2017-02-15 DIAGNOSIS — Y92511 Restaurant or cafe as the place of occurrence of the external cause: Secondary | ICD-10-CM | POA: Diagnosis not present

## 2017-02-15 DIAGNOSIS — Y998 Other external cause status: Secondary | ICD-10-CM | POA: Diagnosis not present

## 2017-02-15 NOTE — ED Notes (Signed)
Pt able to tolerate liquids and solids without difficulty.  Pt denies any trouble swallowing, denies any nausea.  MD notified.

## 2017-02-15 NOTE — ED Notes (Signed)
Pt given graham crackers and ginger ale for PO challenge °

## 2017-02-15 NOTE — ED Triage Notes (Signed)
Pt was at [employer], got choked on chicken, bystander did heimlich and food came back up. Here now with sore throat, concerned food is in her lungs and still in throat-states she has been coughing and trying to dislodge something. No coughing in triage. Pt appears in no distress. VSS.

## 2017-02-15 NOTE — ED Provider Notes (Signed)
[facility] Emergency Department Provider Note    First MD Initiated Contact with Patient 02/15/17 1521     (approximate)  I have reviewed the triage vital signs and the nursing notes.   HISTORY  Chief Complaint Sore Throat    HPI Kathleen Gould is a 51 y.o. female who presents for evaluation after a choking episode that occurred in a [place] today. Patient felt food get stuck in the back of her throat and could not breathe. Bystander didn't stop in and perform a Heimlich maneuver at which point she did cough up some phlegm and aspirated appearing food. Since then was still having sensation of a tickling in her throat and sore throat. Was able to drink some water but has not had anything to eat. Came to the ER due to concern for swallowing chicken bone but did not observe her feel chicken bone the time.   Past Medical History:  Diagnosis Date  . Arthritis   . Endometriosis   . Environmental allergies   . GERD (gastroesophageal reflux disease)   . Hypercholesterolemia   . Hypertension   . Uterine fibroid    Family History  Problem Relation Age of Onset  . Heart attack Father 63  . Heart disease Father        bypass surgery  . Hypertension Father   . Hypercholesterolemia Father   . Kidney disease Father   . Diabetes Mother   . Diabetes Unknown        aunt  . Breast cancer Neg Hx    Past Surgical History:  Procedure Laterality Date  . ABDOMINAL HYSTERECTOMY    . CESAREAN SECTION     x2  . OOPHORECTOMY     S/P left secondary to cyst  . THYROGLOSSAL DUCT CYST N/A 05/27/2015   Procedure: THYROGLOSSAL DUCT CYST EXCISION ;  Surgeon: Clyde Canterbury, MD;  Location: [facility] ORS;  Service: ENT;  Laterality: N/A;   Patient Active Problem List   Diagnosis Date Noted  . Cough 02/08/2017  . Acute pain of right knee 12/14/2016  . Stress 07/17/2016  . 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 Health care maintenance 10/29/2014  . MGUS  (monoclonal gammopathy of unknown significance) 04/04/2014  . Thrombocytosis ([place]) 07/28/2013  . Environmental allergies 11/19/2012  . Essential hypertension, benign 11/05/2012  . Anemia 11/05/2012  . Esophageal reflux 11/05/2012  . Pure hypercholesterolemia 11/05/2012      Prior to Admission medications   Medication Sig Start Date End Date Taking? Authorizing Provider  ALPRAZolam (XANAX) 0.25 MG tablet Take 1 tablet (0.25 mg total) by mouth 2 (two) times daily as needed for anxiety. 07/07/16   Einar Pheasant, MD  atorvastatin (LIPITOR) 20 MG tablet Take 1 tablet (20 mg total) by mouth daily. 12/01/16   Einar Pheasant, MD  benzonatate (TESSALON) 200 MG capsule Take 1 capsule (200 mg total) by mouth 2 (two) times daily as needed for cough. 02/08/17   Leone Haven, MD  doxycycline (VIBRA-TABS) 100 MG tablet Take 1 tablet (100 mg total) by mouth 2 (two) times daily. 02/08/17   Leone Haven, MD  felodipine (PLENDIL) 5 MG 24 hr tablet TAKE 1 TABLET (5 MG TOTAL) BY MOUTH DAILY. 12/01/16   Einar Pheasant, MD  felodipine (PLENDIL) 5 MG 24 hr tablet TAKE 1 TABLET (5 MG TOTAL) BY MOUTH DAILY. 12/28/16   Einar Pheasant, MD  fluticasone (FLONASE) 50 MCG/ACT nasal spray Place 2 sprays into both nostrils daily. 12/01/16  Einar Pheasant, MD  Omeprazole-Sodium Bicarbonate (ZEGERID OTC PO) Take 1 capsule by mouth daily.     [provider]  triamterene-hydrochlorothiazide (MAXZIDE-25) 37.5-25 MG tablet TAKE 0.5 TABLETS BY MOUTH DAILY. 12/01/16   Einar Pheasant, MD    Allergies Cefdinir; Levofloxacin; and Simvastatin    Social History Social History  Substance Use Topics  . Smoking status: Never Smoker  . Smokeless tobacco: Never Used  . Alcohol use No    Review of Systems Patient denies headaches, rhinorrhea, blurry vision, numbness, shortness of breath, chest pain, edema, cough, abdominal pain, nausea, vomiting, diarrhea, dysuria, fevers, rashes or hallucinations unless otherwise  stated above in HPI. ____________________________________________   PHYSICAL EXAM:  VITAL SIGNS: Vitals:   02/15/17 1434  BP: 129/75  Pulse: 81  Resp: 18  Temp: 98.3 F (36.8 C)    Constitutional: Alert and oriented. Well appearing and in no acute distress. Eyes: Conjunctivae are normal.  Head: Atraumatic. Nose: No congestion/rhinnorhea. Mouth/Throat: Mucous membranes are moist. Posterior pharynx clear and non erythematous  Neck: No stridor. Painless ROM.  Cardiovascular: Normal rate, regular rhythm. Grossly normal heart sounds.  Good peripheral circulation. Respiratory: Normal respiratory effort.  No retractions. Lungs CTAB. Gastrointestinal: Soft and nontender. No distention. No abdominal bruits. No CVA tenderness. Musculoskeletal: No lower extremity tenderness nor edema.  No joint effusions. Neurologic:  Normal speech and language. No gross focal neurologic deficits are appreciated. No facial droop Skin:  Skin is warm, dry and intact. No rash noted. Psychiatric: Mood and affect are normal. Speech and behavior are normal.  ____________________________________________   LABS (all labs ordered are listed, but only abnormal results are displayed)  No results found for this or any previous visit (from the past 24 hour(s)). ____________________________________________  ____________________________________________  [id number]  I personally reviewed all radiographic images ordered to evaluate for the above acute complaints and reviewed radiology reports and findings.  These findings were personally discussed with the patient.  Please see medical record for radiology report.  ____________________________________________   PROCEDURES  Procedure(s) performed:  Procedures    Critical Care performed: no ____________________________________________   INITIAL IMPRESSION / ASSESSMENT AND PLAN / ED COURSE  Pertinent labs & imaging results that were available during my  care of the patient were reviewed by me and considered in my medical decision making (see chart for details).  DDX: fb, esophagitis, tracheal fb,   Kathleen Gould is a 51 y.o. who presents to the ED with chief complaint of sore throat after choking incident with concern for retained foreign body. Soft tissue neck x-ray showed possible retained foreign body in esophagus. Patient is well-appearing and does not have any objective findings of pneumomediastinum or perforation. Based on her history and presenting complaints will order CT scan to further characterize concern for foreign body in esophagus or trachea.  Clinical Course as of Feb 16 1731  Wed Feb 15, 2017  1731 CT imaging shows no retained foreign body. Patient has tolerated oral hydration and crackers and peanut butter with no discomfort. Irregularity on esophagus of uncertain significance but at this point I do not feel this is consistent with pneumomediastinum, perforation or laceration there would require further diagnostic testing or intervention at this time and the patient is stable for observation as an outpatient.  Have discussed with the patient and available family all diagnostics and treatments performed thus far and all questions were answered to the best of my ability. The patient demonstrates understanding and agreement with plan.   [PR]  Clinical Course User Index [PR] Merlyn Lot, MD     ____________________________________________   FINAL CLINICAL IMPRESSION(S) / ED DIAGNOSES  Final diagnoses:  Sore throat  Choking due to foreign body, initial encounter      NEW MEDICATIONS STARTED DURING THIS VISIT:  New Prescriptions   No medications on file     Note:  This document was prepared using Dragon voice recognition software and may include unintentional dictation errors.    Merlyn Lot, MD 02/15/17 1734

## 2017-02-15 NOTE — ED Notes (Signed)
Patient transported to CT 

## 2017-02-27 ENCOUNTER — Telehealth: Payer: Self-pay | Admitting: Internal Medicine

## 2017-02-27 DIAGNOSIS — K229 Disease of esophagus, unspecified: Secondary | ICD-10-CM

## 2017-02-27 DIAGNOSIS — R9389 Abnormal findings on diagnostic imaging of other specified body structures: Secondary | ICD-10-CM

## 2017-02-27 NOTE — Telephone Encounter (Signed)
Pt lvm stating that she is waiting on her referral for her esophagus. No referral or order is in her chart. Please let me know what to schedule for her.

## 2017-02-27 NOTE — Telephone Encounter (Signed)
Order placed for GI referral.   

## 2017-03-17 ENCOUNTER — Encounter: Payer: Self-pay | Admitting: Internal Medicine

## 2017-03-17 ENCOUNTER — Ambulatory Visit (INDEPENDENT_AMBULATORY_CARE_PROVIDER_SITE_OTHER): Payer: BC Managed Care – PPO | Admitting: Internal Medicine

## 2017-03-17 VITALS — BP 124/80 | HR 72 | Temp 98.6°F | Resp 12 | Ht 66.0 in | Wt 213.0 lb

## 2017-03-17 DIAGNOSIS — R911 Solitary pulmonary nodule: Secondary | ICD-10-CM

## 2017-03-17 DIAGNOSIS — F439 Reaction to severe stress, unspecified: Secondary | ICD-10-CM

## 2017-03-17 DIAGNOSIS — R938 Abnormal findings on diagnostic imaging of other specified body structures: Secondary | ICD-10-CM | POA: Diagnosis not present

## 2017-03-17 DIAGNOSIS — D472 Monoclonal gammopathy: Secondary | ICD-10-CM

## 2017-03-17 DIAGNOSIS — I10 Essential (primary) hypertension: Secondary | ICD-10-CM | POA: Diagnosis not present

## 2017-03-17 DIAGNOSIS — E7800 Pure hypercholesterolemia, unspecified: Secondary | ICD-10-CM | POA: Diagnosis not present

## 2017-03-17 DIAGNOSIS — R9389 Abnormal findings on diagnostic imaging of other specified body structures: Secondary | ICD-10-CM

## 2017-03-17 DIAGNOSIS — K219 Gastro-esophageal reflux disease without esophagitis: Secondary | ICD-10-CM

## 2017-03-17 LAB — LIPID PANEL
CHOL/HDL RATIO: 3
Cholesterol: 222 mg/dL — ABNORMAL HIGH (ref 0–200)
HDL: 75.8 mg/dL (ref >39.00)
LDL CALC: 128 mg/dL — AB (ref 0–99)
NONHDL: 145.92
Triglycerides: 91 mg/dL (ref 0.0–149.0)
VLDL: 18.2 mg/dL (ref 0.0–40.0)

## 2017-03-17 LAB — HEPATIC FUNCTION PANEL
ALK PHOS: 76 U/L (ref 39–117)
ALT: 14 U/L (ref 0–35)
AST: 12 U/L (ref 0–37)
Albumin: 4.4 g/dL (ref 3.5–5.2)
BILIRUBIN DIRECT: 0.1 mg/dL (ref 0.0–0.3)
BILIRUBIN TOTAL: 0.7 mg/dL (ref 0.2–1.2)
TOTAL PROTEIN: 7.4 g/dL (ref 6.0–8.3)

## 2017-03-17 LAB — BASIC METABOLIC PANEL
BUN: 9 mg/dL (ref 6–23)
CALCIUM: 10.7 mg/dL — AB (ref 8.4–10.5)
CO2: 32 mEq/L (ref 19–32)
Chloride: 100 mEq/L (ref 96–112)
Creatinine, Ser: 0.75 mg/dL (ref 0.40–1.20)
GFR: 104.03 mL/min (ref >60.00)
Glucose, Bld: 97 mg/dL (ref 70–99)
POTASSIUM: 4.1 meq/L (ref 3.5–5.1)
Sodium: 140 mEq/L (ref 135–145)

## 2017-03-17 LAB — TSH: TSH: 1.42 u[IU]/mL (ref 0.35–4.50)

## 2017-03-17 NOTE — Progress Notes (Signed)
Pre-visit discussion using our clinic review tool. No additional management support is needed unless otherwise documented below in the visit note.  

## 2017-03-17 NOTE — Progress Notes (Signed)
Patient ID: HUNTER PINKARD, female   DOB: [birth year], 51 y.o.   MRN: [id number]   Subjective:    Patient ID: GURNOOR SLOOP, female    DOB: [birth year], 50 y.o.   MRN: [id number]  HPI  Patient here for a scheduled follow up.  Was seen in the ER on 02/15/17.  Notes reviewed.  CT reviewed.  Irregularity in the esophagus.  Has appt scheduled with GI for further evaluation and question of need for further testing.  She is swallowing ok.  No pain.  No acid reflux.  No abdominal pain.  Bowels stable.  Handling stress.  Overall doing better.  Still coping with her husband's death.  On the scan, 14mm nodule was noted.  Radiology recommended if low risk, f/u CT scan of chest in 12 months.  Discussed with her today.  Will forward results to Dr Rogue Bussing.   Past Medical History:  Diagnosis Date  . Arthritis   . Endometriosis   . Environmental allergies   . GERD (gastroesophageal reflux disease)   . Hypercholesterolemia   . Hypertension   . Uterine fibroid    Past Surgical History:  Procedure Laterality Date  . ABDOMINAL HYSTERECTOMY    . CESAREAN SECTION     x2  . OOPHORECTOMY     S/P left secondary to cyst  . THYROGLOSSAL DUCT CYST N/A 05/27/2015   Procedure: THYROGLOSSAL DUCT CYST EXCISION ;  Surgeon: Clyde Canterbury, MD;  Location: [facility] ORS;  Service: ENT;  Laterality: N/A;   Family History  Problem Relation Age of Onset  . Heart attack Father 23  . Heart disease Father        bypass surgery  . Hypertension Father   . Hypercholesterolemia Father   . Kidney disease Father   . Diabetes Mother   . Diabetes Unknown        aunt  . Breast cancer Neg Hx    Social History   Social History  . Marital status: Married    Spouse name: N/A  . Number of children: 2  . Years of education: N/A   Social History Main Topics  . Smoking status: Never Smoker  . Smokeless tobacco: Never Used  . Alcohol use No  . Drug use: No  . Sexual activity: Not Asked   Other Topics Concern  . None     Social History Narrative  . None    Outpatient Encounter Prescriptions as of 03/17/2017  Medication Sig  . ALPRAZolam (XANAX) 0.25 MG tablet Take 1 tablet (0.25 mg total) by mouth 2 (two) times daily as needed for anxiety.  Marland Kitchen atorvastatin (LIPITOR) 20 MG tablet Take 1 tablet (20 mg total) by mouth daily.  . felodipine (PLENDIL) 5 MG 24 hr tablet TAKE 1 TABLET (5 MG TOTAL) BY MOUTH DAILY.  . fluticasone (FLONASE) 50 MCG/ACT nasal spray Place 2 sprays into both nostrils daily.  [name] Take 1 capsule by mouth daily.   Marland Kitchen triamterene-hydrochlorothiazide (MAXZIDE-25) 37.5-25 MG tablet TAKE 0.5 TABLETS BY MOUTH DAILY.  . [DISCONTINUED] benzonatate (TESSALON) 200 MG capsule Take 1 capsule (200 mg total) by mouth 2 (two) times daily as needed for cough.  . [DISCONTINUED] doxycycline (VIBRA-TABS) 100 MG tablet Take 1 tablet (100 mg total) by mouth 2 (two) times daily.  . [DISCONTINUED] felodipine (PLENDIL) 5 MG 24 hr tablet TAKE 1 TABLET (5 MG TOTAL) BY MOUTH DAILY.   No facility-administered encounter medications on file as of 03/17/2017.  Review of Systems  Constitutional: Negative for appetite change and unexpected weight change.  HENT: Negative for congestion and sinus pressure.   Respiratory: Negative for cough, chest tightness and shortness of breath.   Cardiovascular: Negative for chest pain, palpitations and leg swelling.  Gastrointestinal: Negative for abdominal pain, diarrhea, nausea and vomiting.  Genitourinary: Negative for difficulty urinating and dysuria.  Musculoskeletal: Negative for back pain and joint swelling.  Skin: Negative for color change and rash.  Neurological: Negative for dizziness, light-headedness and headaches.  Psychiatric/Behavioral: Negative for agitation and dysphoric mood.       Objective:    Physical Exam  Constitutional: She appears well-developed and well-nourished. No distress.  HENT:  Nose: Nose normal.   Mouth/Throat: Oropharynx is clear and moist.  Neck: Neck supple. No thyromegaly present.  Cardiovascular: Normal rate and regular rhythm.   Pulmonary/Chest: Breath sounds normal. No respiratory distress. She has no wheezes.  Abdominal: Soft. Bowel sounds are normal. There is no tenderness.  Musculoskeletal: She exhibits no edema or tenderness.  Lymphadenopathy:    She has no cervical adenopathy.  Skin: No rash noted. No erythema.  Psychiatric: She has a normal mood and affect. Her behavior is normal.    BP 124/80 (BP Location: Left Arm, Patient Position: Sitting, Cuff Size: Large)   Pulse 72   Temp 98.6 F (37 C) (Oral)   Resp 12   Ht 5\' 6"  (1.676 m)   Wt 213 lb (96.6 kg)   LMP 11/06/2000   SpO2 98%   BMI 34.38 kg/m  Wt Readings from Last 3 Encounters:  03/17/17 213 lb (96.6 kg)  02/15/17 213 lb (96.6 kg)  02/08/17 213 lb 12.8 oz (97 kg)     Lab Results  Component Value Date   WBC 5.9 11/15/2016   HGB 13.3 11/15/2016   HCT 39.7 11/15/2016   PLT 428 11/15/2016   GLUCOSE 97 03/17/2017   CHOL 222 (H) 03/17/2017   TRIG 91.0 03/17/2017   HDL 75.80 03/17/2017   LDLDIRECT 164.5 07/24/2013   LDLCALC 128 (H) 03/17/2017   ALT 14 03/17/2017   AST 12 03/17/2017   NA 140 03/17/2017   K 4.1 03/17/2017   CL 100 03/17/2017   CREATININE 0.75 03/17/2017   BUN 9 03/17/2017   CO2 32 03/17/2017   TSH 1.42 03/17/2017   INR 1.0 04/21/2014    Dg Neck Soft Tissue  Result Date: 02/15/2017 CLINICAL DATA:  Patient porch coming choked on chicken and underwent Heimlich maneuver 5 by Stentor. Patient currently has a sore throat and is concerned about aspiration or retained material in her throat. The patient reports having been coughing and trying to dislodged something. History previous thyroid surgery. EXAM: NECK SOFT TISSUES - 1+ VIEW COMPARISON:  CT scan of the neck of May 01, 2015 and lateral view of the cervical spine of December 12, 2014. FINDINGS: The prevertebral soft tissue  spaces exhibit normal thickness. The epiglottis is sharp. The hypopharynx heel airway is patent. There is a tiny radiodensity visible behind the tracheal air column which appears separate from tracheal cartilages which could reflect a faintly radiopaque foreign body. This lies anterior to the C5-6 disc level. IMPRESSION: Faint radiodensity that may lie in the proximal cervical esophagus measuring approximately 4 mm x 2 mm. It appears separate from the laryngeal cartilages. This could reflect a retained chicken bone or cartilage fragment. Normal appearance of the epiglottis and hypopharynx. Electronically Signed   By: David  Martinique M.D.   On: 02/15/2017 15:23  Ct Soft Tissue Neck Wo Contrast  Result Date: 02/15/2017 CLINICAL DATA:  Choking event.  Rule out foreign body in esophagus. EXAM: CT NECK WITHOUT CONTRAST TECHNIQUE: Multidetector CT imaging of the neck was performed following the standard protocol without intravenous contrast. COMPARISON:  CT neck 05/01/2015, soft tissue neck 02/16/2015 FINDINGS: Pharynx and larynx: Normal. No mass or swelling. Negative for radiopaque foreign body. The density on the lateral soft tissue neck today represents atherosclerotic calcification in the right carotid. Salivary glands: No inflammation, mass, or stone. Thyroid: Negative Lymph nodes: Negative for adenopathy Vascular: Mild atherosclerotic calcification right carotid bifurcation Limited intracranial: Negative Visualized orbits: Negative Mastoids and visualized paranasal sinuses: Mild mucosal edema right maxillary sinus Skeleton: Mild degenerative change in the cervical spine. No acute skeletal abnormality. Upper chest: Chest CT from today reported separately Other: None IMPRESSION: No acute abnormality than Questioned density in the region of the proximal esophagus on the lateral soft tissue neck from today represents atherosclerotic calcification in the right carotid artery. No radiopaque foreign body identified in  the pharynx or proximal esophagus. Electronically Signed   By: Franchot Gallo M.D.   On: 02/15/2017 16:18   Ct Chest Wo Contrast  Result Date: 02/15/2017 CLINICAL DATA:  Sore throat after choking on food and Heimlich maneuver. Concern for aspiration. EXAM: CT CHEST WITHOUT CONTRAST TECHNIQUE: Multidetector CT imaging of the chest was performed following the standard protocol without IV contrast. COMPARISON:  Chest radiograph 02/08/2017 FINDINGS: Cardiovascular: No significant vascular findings. Normal heart size. No pericardial effusion. Mediastinum/Nodes: No significant lymph node enlargement. The configuration of the gas within the esophagus is slightly irregular in the mid chest near the carina on sequence 3, image 53. This could represent normal collapsed esophagus but limited evaluation on this noncontrast examination. No evidence for pneumomediastinum. Thyroid tissue is unremarkable. Lungs/Pleura: Trachea and mainstem bronchi are patent. No large airway obstruction. Small nodular density in the right middle lobe on sequence 4, image 78 measures 4 mm. Lungs are clear without areas of airspace disease or consolidation. No pleural effusions. Upper Abdomen: No acute abnormality in the upper abdomen. Musculoskeletal: No suspicious bone findings. IMPRESSION: Appearance of the mid esophagus is slightly irregular but limited evaluation on this noncontrast examination. No evidence for pneumomediastinum. If there is clinical concern for an esophagus injury, recommend an esophagram or upper endoscopy. No evidence for pneumonia or aspiration. 4 mm nodule in the right middle lobe is indeterminate. No follow-up needed if patient is low-risk. Non-contrast chest CT can be considered in 12 months if patient is high-risk. This recommendation follows the consensus statement: Guidelines for Management of Incidental Pulmonary Nodules Detected on CT Images: From the Fleischner Society 2017; Radiology 2017; 284:228-243.  Electronically Signed   By: Markus Daft M.D.   On: 02/15/2017 17:24       Assessment & Plan:   Problem List Items Addressed This Visit    Abnormal chest CT    Seen recently in ER for choking.  CT chest - irregularity in esophagus.  Has appt with GI scheduled for evaluation and question of need for any further w/up or evaluation.        Esophageal reflux    Controlled on omeprazole.        Essential hypertension, benign - Primary    Blood pressure under good control.  Continue same medication regimen.  Follow pressures.  Follow metabolic panel.        Relevant Orders   TSH (Completed)   Basic metabolic panel (Completed)  Lung nodule    18mm nodule noted on CT chest in ER.  Recommended if low risk - f/u 12 months.  Discussed with her today.  Will forward results to Dr Rogue Bussing.        MGUS (monoclonal gammopathy of unknown significance)    Followed by hematology.  Has been stable.  Being followed yearly.       Pure hypercholesterolemia    Low cholesterol diet and exercise.  Follow lipid panel.        Relevant Orders   Hepatic function panel (Completed)   Lipid panel (Completed)   Stress    Increased stress related to her husband's unexpected death.  She is doing better.  Does not feel needs any further intervention at this time.  Follow.            Einar Pheasant, MD

## 2017-03-19 ENCOUNTER — Encounter: Payer: Self-pay | Admitting: Internal Medicine

## 2017-03-19 ENCOUNTER — Other Ambulatory Visit: Payer: Self-pay | Admitting: Internal Medicine

## 2017-03-19 DIAGNOSIS — R911 Solitary pulmonary nodule: Secondary | ICD-10-CM | POA: Insufficient documentation

## 2017-03-19 DIAGNOSIS — R9389 Abnormal findings on diagnostic imaging of other specified body structures: Secondary | ICD-10-CM | POA: Insufficient documentation

## 2017-03-19 NOTE — Assessment & Plan Note (Signed)
Followed by hematology.  Has been stable.  Being followed yearly.

## 2017-03-19 NOTE — Assessment & Plan Note (Signed)
Increased stress related to her husband's unexpected death.  She is doing better.  Does not feel needs any further intervention at this time.  Follow.

## 2017-03-19 NOTE — Progress Notes (Signed)
Order placed for f/u calcium.  

## 2017-03-19 NOTE — Assessment & Plan Note (Signed)
Seen recently in ER for choking.  CT chest - irregularity in esophagus.  Has appt with GI scheduled for evaluation and question of need for any further w/up or evaluation.

## 2017-03-19 NOTE — Assessment & Plan Note (Signed)
Controlled on omeprazole.   

## 2017-03-19 NOTE — Assessment & Plan Note (Signed)
Blood pressure under good control.  Continue same medication regimen.  Follow pressures.  Follow metabolic panel.   

## 2017-03-19 NOTE — Assessment & Plan Note (Signed)
77mm nodule noted on CT chest in ER.  Recommended if low risk - f/u 12 months.  Discussed with her today.  Will forward results to Dr Rogue Bussing.

## 2017-03-19 NOTE — Assessment & Plan Note (Signed)
Low cholesterol diet and exercise.  Follow lipid panel.   

## 2017-03-27 ENCOUNTER — Other Ambulatory Visit: Payer: Self-pay | Admitting: Internal Medicine

## 2017-04-03 ENCOUNTER — Encounter: Payer: Self-pay | Admitting: Internal Medicine

## 2017-04-03 ENCOUNTER — Other Ambulatory Visit (INDEPENDENT_AMBULATORY_CARE_PROVIDER_SITE_OTHER): Payer: BC Managed Care – PPO

## 2017-04-03 LAB — CALCIUM: CALCIUM: 9.9 mg/dL (ref 8.6–10.4)

## 2017-04-03 NOTE — Addendum Note (Signed)
Addended by: Arby Barrette on: 04/03/2017 09:59 AM   Modules accepted: Orders

## 2017-04-07 ENCOUNTER — Ambulatory Visit (INDEPENDENT_AMBULATORY_CARE_PROVIDER_SITE_OTHER): Payer: BC Managed Care – PPO | Admitting: Gastroenterology

## 2017-04-07 ENCOUNTER — Encounter: Payer: Self-pay | Admitting: Gastroenterology

## 2017-04-07 VITALS — BP 124/79 | HR 70 | Temp 98.0°F | Ht 66.0 in | Wt 214.8 lb

## 2017-04-07 DIAGNOSIS — K219 Gastro-esophageal reflux disease without esophagitis: Secondary | ICD-10-CM | POA: Diagnosis not present

## 2017-04-07 MED ORDER — OMEPRAZOLE 40 MG PO CPDR: 40.0000 mg | DELAYED_RELEASE_CAPSULE | Freq: Two times a day (BID) | ORAL | 0 refills | Status: DC

## 2017-04-07 NOTE — Patient Instructions (Addendum)
1. Stop zegrid 2. Start omeprazole 40mg  two times daily 50min before breakfast and dinner 3. Anti-reflux measures as discussed 4. If reflux not controlled in 8weeks, contact me on my chart to schedule upper endoscopy  Cephas Darby, MD

## 2017-04-07 NOTE — Telephone Encounter (Signed)
Hard copy mailed  

## 2017-04-07 NOTE — Progress Notes (Signed)
Kathleen Darby, MD [address]  [place]  [place], [place] [address]  Main: [phone]  Fax: [phone]   Gastroenterology Consultation  Referring Provider:     Einar Pheasant, MD Primary Care Physician:  Einar Pheasant, MD Primary Gastroenterologist:  Dr. Sherri Sear Reason for Consultation:  Throat discomfort        HPI:   Kathleen Gould is a 51 y.o. y/o female referred for consultation & management  by Dr. Einar Pheasant, MD. Kathleen Gould reports about 2-3 years history of intermittent heartburn and has been on over-the-counter omeprazole once daily which provided some relief. But in last few months she has been suffering from constant drainage in her throat and soreness in her throat. Although she denies any difficulty swallowing both to solids and liquids, she does feel there is something in her throat that interferes with swallowing.She thought that throat drainage was secondary to sinus infection and she received antibiotics, fluticasone nasal spray but with no relief. She started taking Zegerid early in the morning one pill a day. She does not smoke or drink alcohol. She denies any other upper GI symptoms. She denies fever, chills, nausea, vomiting, loss of appetite, weight loss. She lost her husband in November from cardiac arrest. She had a colonoscopy at age 1 and denies any history of polyps. She never had an upper endoscopy in the past and we do not know her H. pylori status. She denies taking NSAIDs. Or over-the-counter herbal supplements.    Past Medical History:  Diagnosis Date  . Arthritis   . Endometriosis   . Environmental allergies   . GERD (gastroesophageal reflux disease)   . Hypercholesterolemia   . Hypertension   . Uterine fibroid     Past Surgical History:  Procedure Laterality Date  . ABDOMINAL HYSTERECTOMY    . CESAREAN SECTION     x2  . OOPHORECTOMY     S/P left secondary to cyst  . THYROGLOSSAL DUCT CYST N/A 05/27/2015   Procedure:  THYROGLOSSAL DUCT CYST EXCISION ;  Surgeon: Clyde Canterbury, MD;  Location: [facility] ORS;  Service: ENT;  Laterality: N/A;    Prior to Admission medications   Medication Sig Start Date End Date Taking? Authorizing Provider  atorvastatin (LIPITOR) 20 MG tablet Take 1 tablet (20 mg total) by mouth daily. 12/01/16  Yes Einar Pheasant, MD  felodipine (PLENDIL) 5 MG 24 hr tablet TAKE 1 TABLET (5 MG TOTAL) BY MOUTH DAILY. 12/28/16  Yes Einar Pheasant, MD  fluticasone (FLONASE) 50 MCG/ACT nasal spray Place 2 sprays into both nostrils daily. 12/01/16  Yes Einar Pheasant, MD  Omeprazole-Sodium Bicarbonate (ZEGERID OTC PO) Take 1 capsule by mouth daily.    Yes [provider]  triamterene-hydrochlorothiazide (MAXZIDE-25) 37.5-25 MG tablet TAKE 0.5 TABLETS BY MOUTH DAILY. 12/01/16  Yes Einar Pheasant, MD  ALPRAZolam Duanne Moron) 0.25 MG tablet Take 1 tablet (0.25 mg total) by mouth 2 (two) times daily as needed for anxiety. Patient not taking: Reported on 04/07/2017 07/07/16   Einar Pheasant, MD  atorvastatin (LIPITOR) 20 MG tablet TAKE 1 TABLET (20 MG TOTAL) BY MOUTH DAILY. Patient not taking: Reported on 04/07/2017 03/27/17   Einar Pheasant, MD    Family History  Problem Relation Age of Onset  . Heart attack Father 83  . Heart disease Father        bypass surgery  . Hypertension Father   . Hypercholesterolemia Father   . Kidney disease Father   . Diabetes Mother   . Diabetes  Unknown        aunt  . Breast cancer Neg Hx      Social History  Substance Use Topics  . Smoking status: Never Smoker  . Smokeless tobacco: Never Used  . Alcohol use No    Allergies as of 04/07/2017 - Review Complete 04/07/2017  Allergen Reaction Noted  . Cefdinir Itching 02/27/2015  . Levofloxacin Other (See Comments) 11/12/2016  . Simvastatin Other (See Comments) 11/05/2012    Review of Systems:    All systems reviewed and negative except where noted in HPI.   Physical Exam:  BP 124/79   Pulse 70   Temp 98 F  (36.7 C) (Oral)   Ht 5\' 6"  (1.676 m)   Wt 97.4 kg (214 lb 12.8 oz)   LMP 11/06/2000   BMI 34.67 kg/m  Patient's last menstrual period was 11/06/2000. Psych:  Alert and cooperative. Normal mood and affect. General:   Alert,  Well-developed, well-nourished, pleasant and cooperative in NAD Head:  Normocephalic and atraumatic. Eyes:  Sclera clear, no icterus.   Conjunctiva pink. Ears:  Normal auditory acuity. Nose:  No deformity, discharge, or lesions. Mouth:  No deformity or lesions,oropharynx pink & moist. Neck:  Supple; no masses or thyromegaly. Lungs:  Respirations even and unlabored.  Clear throughout to auscultation.   No wheezes, crackles, or rhonchi. No acute distress. Heart:  Regular rate and rhythm; no murmurs, clicks, rubs, or gallops. Abdomen:  Normal bowel sounds.  No bruits.  Soft, non-tender and non-distended without masses, hepatosplenomegaly or hernias noted.  No guarding or rebound tenderness.    Msk:  Symmetrical without gross deformities. Good, equal movement & strength bilaterally. Pulses:  Normal pulses noted. Extremities:  No clubbing or edema.  No cyanosis. Neurologic:  Alert and oriented x3;  grossly normal neurologically. Skin:  Intact without significant lesions or rashes. No jaundice. Lymph Nodes:  No significant cervical adenopathy. Psych:  Alert and cooperative. Normal mood and affect.  Imaging Studies: Reviewed CT soft tissue neck, chest from June 2018 and the questionable soft tissue mass in the neck was thought to be secondary to atherosclerotic calcification in the carotid artery.  Assessment and Plan:   Kathleen Gould is a 51 y.o. y/o female has been referred for Chronic intermittent heartburn and constant drainage in her throat. Her symptoms most likely represent uncontrolled GERD. Therefore I will treat this with optimal PPI therapy and thereafter perform upper endoscopy given her age and chronicity of symptoms. I counseled her on antireflux  measures and lifestyle changes, encouraged her to lose weight.   Follow up in 3 months.  Dr. Sherri Sear

## 2017-04-14 ENCOUNTER — Ambulatory Visit: Payer: BC Managed Care – PPO | Admitting: Gastroenterology

## 2017-05-03 ENCOUNTER — Encounter: Payer: Self-pay | Admitting: Family

## 2017-05-03 ENCOUNTER — Ambulatory Visit (INDEPENDENT_AMBULATORY_CARE_PROVIDER_SITE_OTHER): Payer: BC Managed Care – PPO | Admitting: Family

## 2017-05-03 VITALS — BP 116/74 | HR 69 | Temp 98.1°F | Ht 66.0 in | Wt 215.0 lb

## 2017-05-03 DIAGNOSIS — K219 Gastro-esophageal reflux disease without esophagitis: Secondary | ICD-10-CM

## 2017-05-03 DIAGNOSIS — R059 Cough, unspecified: Secondary | ICD-10-CM

## 2017-05-03 DIAGNOSIS — R05 Cough: Secondary | ICD-10-CM

## 2017-05-03 NOTE — Assessment & Plan Note (Signed)
Working diagnosis postnasal drip contributing to cough particularly as patient lays down. Advised to try Flonase,  nasal saline spray, over-the-counter antihistamine. Return precautions given.  Of note, we also discussed CT chest, CT head and neck from emergency room visit 2 months ago. Advised patient to follow-up PCP regarding right carotid atherosclerosis and whether or not patient would benefit from carotid ultrasound. Patient politely declines repeat CT of chest  As she does not have any smoking history or family history of lung cancer.

## 2017-05-03 NOTE — Patient Instructions (Signed)
I am thinking postnasal drip contributing to cough. try Flonase, nasal saline spray, over-the-counter antihistamine - Allegra, Zyrtec, or Clartin.  Of note, we also discussed CT chest, CT head and neck from emergency room visit 2 months ago. Please follow-up PCP regarding right carotid atherosclerosis and whether or not  would benefit from carotid ultrasound at some point. No further screening of right lung nodule unless you have concerns since you are low risk.   Let us know if not better  Allergic Rhinitis Allergic rhinitis is when the mucous membranes in the nose respond to allergens. Allergens are particles in the air that cause your body to have an allergic reaction. This causes you to release allergic antibodies. Through a chain of events, these eventually cause you to release histamine into the blood stream. Although meant to protect the body, it is this release of histamine that causes your discomfort, such as frequent sneezing, congestion, and an itchy, runny nose. What are the causes? Seasonal allergic rhinitis (hay fever) is caused by pollen allergens that may come from grasses, trees, and weeds. Year-round allergic rhinitis (perennial allergic rhinitis) is caused by allergens such as house dust mites, pet dander, and mold spores. What are the signs or symptoms?  Nasal stuffiness (congestion).  Itchy, runny nose with sneezing and tearing of the eyes. How is this diagnosed? Your health care provider can help you determine the allergen or allergens that trigger your symptoms. If you and your health care provider are unable to determine the allergen, skin or blood testing may be used. Your health care provider will diagnose your condition after taking your health history and performing a physical exam. Your health care provider may assess you for other related conditions, such as asthma, pink eye, or an ear infection. How is this treated? Allergic rhinitis does not have a cure, but it can  be controlled by:  Medicines that block allergy symptoms. These may include allergy shots, nasal sprays, and oral antihistamines.  Avoiding the allergen.  Hay fever may often be treated with antihistamines in pill or nasal spray forms. Antihistamines block the effects of histamine. There are over-the-counter medicines that may help with nasal congestion and swelling around the eyes. Check with your health care provider before taking or giving this medicine. If avoiding the allergen or the medicine prescribed do not work, there are many new medicines your health care provider can prescribe. Stronger medicine may be used if initial measures are ineffective. Desensitizing injections can be used if medicine and avoidance does not work. Desensitization is when a patient is given ongoing shots until the body becomes less sensitive to the allergen. Make sure you follow up with your health care provider if problems continue. Follow these instructions at home: It is not possible to completely avoid allergens, but you can reduce your symptoms by taking steps to limit your exposure to them. It helps to know exactly what you are allergic to so that you can avoid your specific triggers. Contact a health care provider if:  You have a fever.  You develop a cough that does not stop easily (persistent).  You have shortness of breath.  You start wheezing.  Symptoms interfere with normal daily activities. This information is not intended to replace advice given to you by your health care provider. Make sure you discuss any questions you have with your health care provider. Document Released: 05/10/2001 Document Revised: 04/15/2016 Document Reviewed: 04/22/2013 Elsevier Interactive Patient Education  2017 [employer].

## 2017-05-03 NOTE — Progress Notes (Signed)
Subjective:    Patient ID: Kathleen Gould, female    DOB: 1966-02-02, 51 y.o.   MRN: [id number]  Kathleen Gould: Kathleen Gould is a 51 y.o. female who presents today for an acute visit.    HPI: Kathleen Gould: productive cough x 4 months, unchanged  Thin mucous from 'tickle' at night in throat when she lays down. Hasn't tried any medication.   No h/o asthma or seasonal allergies.   No improvement on omeprazole. Coughing more at night.   Denies wheezing, sob, sinus pain,exertional chest pain or pressure, numbness or tingling radiating to left arm or jaw, palpitations, dizziness, frequent headaches, changes in vision, or shortness of breath.        03/2017 GI started PPI,  EGD ( pending)  6/20/ 18 ED for sore throat and possible retained body after choking at [facility] - CT chest - no retained body. irregular esophagus, right lung nodule. CT head and neck questioned the density seen in esophagus represents atherosclerotic calcification in right carotid 01/2017 treated for cough with tessalon, doxycyline.   NO smoking  History.   HISTORY:  Past Medical History:  Diagnosis Date  . Arthritis   . Endometriosis   . Environmental allergies   . GERD (gastroesophageal reflux disease)   . Hypercholesterolemia   . Hypertension   . Uterine fibroid    Past Surgical History:  Procedure Laterality Date  . ABDOMINAL HYSTERECTOMY    . CESAREAN SECTION     x2  . OOPHORECTOMY     S/P left secondary to cyst  . THYROGLOSSAL DUCT CYST N/A 05/27/2015   Procedure: THYROGLOSSAL DUCT CYST EXCISION ;  Surgeon: Kathleen Canterbury, MD;  Location: [facility] ORS;  Service: ENT;  Laterality: N/A;   Family History  Problem Relation Age of Onset  . Heart attack Father 73  . Heart disease Father        bypass surgery  . Hypertension Father   . Hypercholesterolemia Father   . Kidney disease Father   . Diabetes Mother   . Diabetes Unknown        aunt  . Breast cancer Neg Hx     Allergies: Cefdinir; Levofloxacin; and  Simvastatin Current Outpatient Prescriptions on File Prior to Visit  Medication Sig Dispense Refill  . ALPRAZolam (XANAX) 0.25 MG tablet Take 1 tablet (0.25 mg total) by mouth 2 (two) times daily as needed for anxiety. 30 tablet 0  . atorvastatin (LIPITOR) 20 MG tablet Take 1 tablet (20 mg total) by mouth daily. 30 tablet 5  . atorvastatin (LIPITOR) 20 MG tablet TAKE 1 TABLET (20 MG TOTAL) BY MOUTH DAILY. 30 tablet 5  . felodipine (PLENDIL) 5 MG 24 hr tablet TAKE 1 TABLET (5 MG TOTAL) BY MOUTH DAILY. 90 tablet 1  . fluticasone (FLONASE) 50 MCG/ACT nasal spray Place 2 sprays into both nostrils daily. 16 g 5  . omeprazole (PRILOSEC) 40 MG capsule Take 1 capsule (40 mg total) by mouth 2 (two) times daily before a meal. 180 capsule 0  . triamterene-hydrochlorothiazide (MAXZIDE-25) 37.5-25 MG tablet TAKE 0.5 TABLETS BY MOUTH DAILY. 30 tablet 5   No current facility-administered medications on file prior to visit.     Social History  Substance Use Topics  . Smoking status: Never Smoker  . Smokeless tobacco: Never Used  . Alcohol use No    Review of Systems  Constitutional: Negative for chills and fever.  HENT: Positive for congestion and postnasal drip. Negative for ear pain, sinus pressure and sore  throat.   Respiratory: Positive for cough. Negative for shortness of breath.   Cardiovascular: Negative for chest pain and palpitations.  Gastrointestinal: Negative for nausea and vomiting.      Objective:    BP 116/74   Pulse 69   Temp 98.1 F (36.7 C) (Oral)   Ht 5\' 6"  (1.676 m)   Wt 215 lb (97.5 kg)   LMP 11/06/2000   SpO2 (!) 65%   BMI 34.70 kg/m    Physical Exam  Constitutional: She appears well-developed and well-nourished.  HENT:  Head: Normocephalic and atraumatic.  Right Ear: Hearing, tympanic membrane, external ear and ear canal normal. No drainage, swelling or tenderness. No foreign bodies. Tympanic membrane is not erythematous and not bulging. No middle ear effusion.  No decreased hearing is noted.  Left Ear: Hearing, tympanic membrane, external ear and ear canal normal. No drainage, swelling or tenderness. No foreign bodies. Tympanic membrane is not erythematous and not bulging.  No middle ear effusion. No decreased hearing is noted.  Nose: Nose normal. No rhinorrhea. Right sinus exhibits no maxillary sinus tenderness and no frontal sinus tenderness. Left sinus exhibits no maxillary sinus tenderness and no frontal sinus tenderness.  Mouth/Throat: Uvula is midline, oropharynx is clear and moist and mucous membranes are normal. No oropharyngeal exudate, posterior oropharyngeal edema, posterior oropharyngeal erythema or tonsillar abscesses.  Eyes: Conjunctivae are normal.  Cardiovascular: Regular rhythm, normal heart sounds and normal pulses.   Pulmonary/Chest: Effort normal and breath sounds normal. She has no wheezes. She has no rhonchi. She has no rales.  Lymphadenopathy:       Head (right side): No submental, no submandibular, no tonsillar, no preauricular, no posterior auricular and no occipital adenopathy present.       Head (left side): No submental, no submandibular, no tonsillar, no preauricular, no posterior auricular and no occipital adenopathy present.    She has no cervical adenopathy.  Neurological: She is alert.  Skin: Skin is warm and dry.  Psychiatric: She has a normal mood and affect. Her speech is normal and behavior is normal. Thought content normal.  Vitals reviewed.      Assessment & Plan:   Problem List Items Addressed This Visit      Digestive   Esophageal reflux    Following with GI. Pending EGD. Currently on omeprazole        Other   Cough    Working diagnosis postnasal drip contributing to cough particularly as patient lays down. Advised to try Flonase,  nasal saline spray, over-the-counter antihistamine. Return precautions given.  Of note, we also discussed CT chest, CT head and neck from emergency room visit 2 months ago.  Advised patient to follow-up PCP regarding right carotid atherosclerosis and whether or not patient would benefit from carotid ultrasound. Patient politely declines repeat CT of chest  As she does not have any smoking history or family history of lung cancer.            I am having Ms. Roadcap maintain her ALPRAZolam, atorvastatin, fluticasone, triamterene-hydrochlorothiazide, felodipine, atorvastatin, and omeprazole.   No orders of the defined types were placed in this encounter.   Return precautions given.   Risks, benefits, and alternatives of the medications and treatment plan prescribed today were discussed, and patient expressed understanding.   Education regarding symptom management and diagnosis given to patient on AVS.  Continue to follow with Einar Pheasant, MD for routine health maintenance.   Megan Salon Dastrup and I agreed with plan.  Mable Paris, FNP

## 2017-05-03 NOTE — Assessment & Plan Note (Signed)
Following with GI. Pending EGD. Currently on omeprazole

## 2017-06-23 ENCOUNTER — Other Ambulatory Visit: Payer: Self-pay | Admitting: Internal Medicine

## 2017-06-26 ENCOUNTER — Ambulatory Visit: Payer: BC Managed Care – PPO | Admitting: Internal Medicine

## 2017-06-29 ENCOUNTER — Ambulatory Visit (INDEPENDENT_AMBULATORY_CARE_PROVIDER_SITE_OTHER): Payer: BC Managed Care – PPO | Admitting: Internal Medicine

## 2017-06-29 ENCOUNTER — Encounter: Payer: Self-pay | Admitting: Internal Medicine

## 2017-06-29 VITALS — BP 120/82 | HR 89 | Temp 97.4°F | Resp 18 | Ht 66.14 in | Wt 214.0 lb

## 2017-06-29 DIAGNOSIS — E7800 Pure hypercholesterolemia, unspecified: Secondary | ICD-10-CM | POA: Diagnosis not present

## 2017-06-29 DIAGNOSIS — R0602 Shortness of breath: Secondary | ICD-10-CM | POA: Diagnosis not present

## 2017-06-29 DIAGNOSIS — K219 Gastro-esophageal reflux disease without esophagitis: Secondary | ICD-10-CM

## 2017-06-29 DIAGNOSIS — F439 Reaction to severe stress, unspecified: Secondary | ICD-10-CM

## 2017-06-29 DIAGNOSIS — I10 Essential (primary) hypertension: Secondary | ICD-10-CM

## 2017-06-29 DIAGNOSIS — R0789 Other chest pain: Secondary | ICD-10-CM

## 2017-06-29 DIAGNOSIS — D472 Monoclonal gammopathy: Secondary | ICD-10-CM

## 2017-06-29 MED ORDER — SERTRALINE HCL 25 MG PO TABS: 25.0000 mg | ORAL_TABLET | Freq: Every day | ORAL | 2 refills | Status: DC

## 2017-06-29 MED ORDER — OMEPRAZOLE 40 MG PO CPDR: 40.0000 mg | DELAYED_RELEASE_CAPSULE | Freq: Every day | ORAL | 1 refills | Status: DC

## 2017-06-29 NOTE — Progress Notes (Signed)
Patient ID: Kathleen Gould, female   DOB: July 14, 1966, 51 y.o.   MRN: [id number]   Subjective:    Patient ID: Kathleen Gould, female    DOB: 1966/04/16, 51 y.o.   MRN: [id number]  HPI  Patient here for a scheduled follow up.  She was evaluated by [name] on 05/03/17.  Was having cough.  Note reviewed.  Instructed to use saline nasal spray, flonase and an antihistamine.  She denies any significant cough now.  Is having acid reflux.  Saw GI (Dr Marius Ditch) in august.  Was instructed to take omeprazole 40mg  daily.  Still with issues.  Also with hoarseness.  She also describes some chest tightness.  Not sure if related to above.  Some belching.  No abdominal pain.  Bowels moving.  Handling stress.  Discussed with her today.  Has good support.     Past Medical History:  Diagnosis Date  . Arthritis   . Endometriosis   . Environmental allergies   . GERD (gastroesophageal reflux disease)   . Hypercholesterolemia   . Hypertension   . Uterine fibroid    Past Surgical History:  Procedure Laterality Date  . ABDOMINAL HYSTERECTOMY    . CESAREAN SECTION     x2  . OOPHORECTOMY     S/P left secondary to cyst   Family History  Problem Relation Age of Onset  . Heart attack Father 63  . Heart disease Father        bypass surgery  . Hypertension Father   . Hypercholesterolemia Father   . Kidney disease Father   . Diabetes Mother   . Diabetes Unknown        aunt  . Breast cancer Neg Hx    Social History   Socioeconomic History  . Marital status: Married    Spouse name: None  . Number of children: 2  . Years of education: None  . Highest education level: None  Social Needs  . Financial resource strain: None  . Food insecurity - worry: None  . Food insecurity - inability: None  . Transportation needs - medical: None  . Transportation needs - non-medical: None  Occupational History  . None  Tobacco Use  . Smoking status: Never Smoker  . Smokeless tobacco: Never Used  Substance  and Sexual Activity  . Alcohol use: No    Alcohol/week: 0.0 oz  . Drug use: No  . Sexual activity: None  Other Topics Concern  . None  Social History Narrative  . None    Outpatient Encounter Medications as of 06/29/2017  Medication Sig  . atorvastatin (LIPITOR) 20 MG tablet TAKE 1 TABLET (20 MG TOTAL) BY MOUTH DAILY.  . felodipine (PLENDIL) 5 MG 24 hr tablet TAKE 1 TABLET (5 MG TOTAL) BY MOUTH DAILY.  . fluticasone (FLONASE) 50 MCG/ACT nasal spray Place 2 sprays into both nostrils daily.  Marland Kitchen omeprazole (PRILOSEC) 40 MG capsule Take 1 capsule (40 mg total) by mouth daily.  Marland Kitchen triamterene-hydrochlorothiazide (MAXZIDE-25) 37.5-25 MG tablet TAKE 0.5 TABLETS BY MOUTH DAILY.  . [DISCONTINUED] omeprazole (PRILOSEC) 40 MG capsule Take 1 capsule (40 mg total) by mouth 2 (two) times daily before a meal.  . sertraline (ZOLOFT) 25 MG tablet Take 1 tablet (25 mg total) by mouth daily.  . [DISCONTINUED] ALPRAZolam (XANAX) 0.25 MG tablet Take 1 tablet (0.25 mg total) by mouth 2 (two) times daily as needed for anxiety.  . [DISCONTINUED] atorvastatin (LIPITOR) 20 MG tablet Take 1 tablet (20 mg total) by mouth  daily.   No facility-administered encounter medications on file as of 06/29/2017.     Review of Systems  Constitutional: Negative for appetite change and unexpected weight change.  HENT: Negative for congestion and sinus pressure.   Respiratory: Positive for chest tightness. Negative for cough and shortness of breath.   Cardiovascular: Negative for chest pain, palpitations and leg swelling.  Gastrointestinal: Negative for abdominal pain, diarrhea, nausea and vomiting.       Acid reflux as outlined.    Genitourinary: Negative for difficulty urinating and dysuria.  Musculoskeletal: Negative for back pain and joint swelling.  Skin: Negative for color change and rash.  Neurological: Negative for dizziness, light-headedness and headaches.  Psychiatric/Behavioral: Negative for agitation and dysphoric  mood.       Objective:    Physical Exam  Constitutional: She appears well-developed and well-nourished. No distress.  HENT:  Nose: Nose normal.  Mouth/Throat: Oropharynx is clear and moist.  Neck: Neck supple. No thyromegaly present.  Cardiovascular: Normal rate and regular rhythm.  Pulmonary/Chest: Breath sounds normal. No respiratory distress. She has no wheezes.  Abdominal: Soft. Bowel sounds are normal. There is no tenderness.  Musculoskeletal: She exhibits no edema or tenderness.  Lymphadenopathy:    She has no cervical adenopathy.  Skin: No rash noted. No erythema.  Psychiatric: She has a normal mood and affect. Her behavior is normal.    BP 120/82 (BP Location: Left Arm, Patient Position: Sitting, Cuff Size: Normal)   Pulse 89   Temp (!) 97.4 F (36.3 C) (Oral)   Resp 18   Ht 5' 6.14" (1.68 m)   Wt 214 lb (97.1 kg)   LMP 11/06/2000   SpO2 98%   BMI 34.39 kg/m  Wt Readings from Last 3 Encounters:  06/29/17 214 lb (97.1 kg)  05/03/17 215 lb (97.5 kg)  04/07/17 214 lb 12.8 oz (97.4 kg)     Lab Results  Component Value Date   WBC 5.9 11/15/2016   HGB 13.3 11/15/2016   HCT 39.7 11/15/2016   PLT 428 11/15/2016   GLUCOSE 97 03/17/2017   CHOL 222 (H) 03/17/2017   TRIG 91.0 03/17/2017   HDL 75.80 03/17/2017   LDLDIRECT 164.5 07/24/2013   LDLCALC 128 (H) 03/17/2017   ALT 14 03/17/2017   AST 12 03/17/2017   NA 140 03/17/2017   K 4.1 03/17/2017   CL 100 03/17/2017   CREATININE 0.75 03/17/2017   BUN 9 03/17/2017   CO2 32 03/17/2017   TSH 1.42 03/17/2017   INR 1.0 04/21/2014    Dg Neck Soft Tissue  Result Date: 02/15/2017 CLINICAL DATA:  Patient porch coming choked on chicken and underwent Heimlich maneuver 5 by Stentor. Patient currently has a sore throat and is concerned about aspiration or retained material in her throat. The patient reports having been coughing and trying to dislodged something. History previous thyroid surgery. EXAM: NECK SOFT TISSUES -  1+ VIEW COMPARISON:  CT scan of the neck of May 01, 2015 and lateral view of the cervical spine of December 12, 2014. FINDINGS: The prevertebral soft tissue spaces exhibit normal thickness. The epiglottis is sharp. The hypopharynx heel airway is patent. There is a tiny radiodensity visible behind the tracheal air column which appears separate from tracheal cartilages which could reflect a faintly radiopaque foreign body. This lies anterior to the C5-6 disc level. IMPRESSION: Faint radiodensity that may lie in the proximal cervical esophagus measuring approximately 4 mm x 2 mm. It appears separate from the laryngeal cartilages. This  could reflect a retained chicken bone or cartilage fragment. Normal appearance of the epiglottis and hypopharynx. Electronically Signed   By: David  Martinique M.D.   On: 02/15/2017 15:23   Ct Soft Tissue Neck Wo Contrast  Result Date: 02/15/2017 CLINICAL DATA:  Choking event.  Rule out foreign body in esophagus. EXAM: CT NECK WITHOUT CONTRAST TECHNIQUE: Multidetector CT imaging of the neck was performed following the standard protocol without intravenous contrast. COMPARISON:  CT neck 05/01/2015, soft tissue neck 02/16/2015 FINDINGS: Pharynx and larynx: Normal. No mass or swelling. Negative for radiopaque foreign body. The density on the lateral soft tissue neck today represents atherosclerotic calcification in the right carotid. Salivary glands: No inflammation, mass, or stone. Thyroid: Negative Lymph nodes: Negative for adenopathy Vascular: Mild atherosclerotic calcification right carotid bifurcation Limited intracranial: Negative Visualized orbits: Negative Mastoids and visualized paranasal sinuses: Mild mucosal edema right maxillary sinus Skeleton: Mild degenerative change in the cervical spine. No acute skeletal abnormality. Upper chest: Chest CT from today reported separately Other: None IMPRESSION: No acute abnormality than Questioned density in the region of the proximal  esophagus on the lateral soft tissue neck from today represents atherosclerotic calcification in the right carotid artery. No radiopaque foreign body identified in the pharynx or proximal esophagus. Electronically Signed   By: Franchot Gallo M.D.   On: 02/15/2017 16:18   Ct Chest Wo Contrast  Result Date: 02/15/2017 CLINICAL DATA:  Sore throat after choking on food and Heimlich maneuver. Concern for aspiration. EXAM: CT CHEST WITHOUT CONTRAST TECHNIQUE: Multidetector CT imaging of the chest was performed following the standard protocol without IV contrast. COMPARISON:  Chest radiograph 02/08/2017 FINDINGS: Cardiovascular: No significant vascular findings. Normal heart size. No pericardial effusion. Mediastinum/Nodes: No significant lymph node enlargement. The configuration of the gas within the esophagus is slightly irregular in the mid chest near the carina on sequence 3, image 53. This could represent normal collapsed esophagus but limited evaluation on this noncontrast examination. No evidence for pneumomediastinum. Thyroid tissue is unremarkable. Lungs/Pleura: Trachea and mainstem bronchi are patent. No large airway obstruction. Small nodular density in the right middle lobe on sequence 4, image 78 measures 4 mm. Lungs are clear without areas of airspace disease or consolidation. No pleural effusions. Upper Abdomen: No acute abnormality in the upper abdomen. Musculoskeletal: No suspicious bone findings. IMPRESSION: Appearance of the mid esophagus is slightly irregular but limited evaluation on this noncontrast examination. No evidence for pneumomediastinum. If there is clinical concern for an esophagus injury, recommend an esophagram or upper endoscopy. No evidence for pneumonia or aspiration. 4 mm nodule in the right middle lobe is indeterminate. No follow-up needed if patient is low-risk. Non-contrast chest CT can be considered in 12 months if patient is high-risk. This recommendation follows the consensus  statement: Guidelines for Management of Incidental Pulmonary Nodules Detected on CT Images: From the Fleischner Society 2017; Radiology 2017; 284:228-243. Electronically Signed   By: Markus Daft M.D.   On: 02/15/2017 17:24       Assessment & Plan:   Problem List Items Addressed This Visit    Chest tightness    Given chest tightness EKG - SR with no acute ischemic changes.  Acid reflux may be contributing.  On omeprazole.  Add zantac as outlined.  Refer back to GI.  Given risk factors and chest tightness, will have Dr Clayborn Bigness evaluate to confirm if any further cardiac w/up warranted.        Relevant Orders   Ambulatory referral to Cardiology  Esophageal reflux    Seeing Dr Marius Ditch.  Changed to omeprazole 40mg  q day.  Still with issues as outlined.  Acid reflux and hoarseness.  Could be contributing to the chest tightness.  Add zantac.  Needs f/u with GI.        Relevant Medications   omeprazole (PRILOSEC) 40 MG capsule   Other Relevant Orders   Ambulatory referral to Gastroenterology   Essential hypertension, benign    Blood pressure under good control.  Continue same medication regimen.  Follow pressures.  Follow metabolic panel.        MGUS (monoclonal gammopathy of unknown significance)    Followed by hematology.  Has been stable.        Pure hypercholesterolemia    Low cholesterol diet and exercise.  Follow lipid panel.        Stress    Increased stress with her husband's unexpected death.  States she is doing better.  Does not feel needs any further intervention.  Follow.         Other Visit Diagnoses    Shortness of breath    -  Primary   Relevant Orders   EKG 12-Lead (Completed)       Einar Pheasant, MD

## 2017-06-29 NOTE — Patient Instructions (Signed)
Zantac (ranitidine) 150mg  - take 30 minutes before breakfast  Continue omeprazole as you are doing.

## 2017-07-02 ENCOUNTER — Encounter: Payer: Self-pay | Admitting: Internal Medicine

## 2017-07-02 DIAGNOSIS — R0789 Other chest pain: Secondary | ICD-10-CM | POA: Insufficient documentation

## 2017-07-02 NOTE — Assessment & Plan Note (Signed)
Low cholesterol diet and exercise.  Follow lipid panel.   

## 2017-07-02 NOTE — Assessment & Plan Note (Signed)
Seeing Dr Marius Ditch.  Changed to omeprazole 40mg  q day.  Still with issues as outlined.  Acid reflux and hoarseness.  Could be contributing to the chest tightness.  Add zantac.  Needs f/u with GI.

## 2017-07-02 NOTE — Assessment & Plan Note (Signed)
Increased stress with her husband's unexpected death.  States she is doing better.  Does not feel needs any further intervention.  Follow.

## 2017-07-02 NOTE — Assessment & Plan Note (Signed)
Followed by hematology.  Has been stable.   

## 2017-07-02 NOTE — Assessment & Plan Note (Signed)
Blood pressure under good control.  Continue same medication regimen.  Follow pressures.  Follow metabolic panel.   

## 2017-07-02 NOTE — Assessment & Plan Note (Signed)
Given chest tightness EKG - SR with no acute ischemic changes.  Acid reflux may be contributing.  On omeprazole.  Add zantac as outlined.  Refer back to GI.  Given risk factors and chest tightness, will have Dr Clayborn Bigness evaluate to confirm if any further cardiac w/up warranted.

## 2017-07-10 ENCOUNTER — Other Ambulatory Visit: Payer: Self-pay

## 2017-07-11 ENCOUNTER — Ambulatory Visit: Payer: BC Managed Care – PPO | Admitting: Gastroenterology

## 2017-07-11 ENCOUNTER — Encounter: Payer: Self-pay | Admitting: Gastroenterology

## 2017-07-11 VITALS — BP 125/83 | HR 74 | Temp 97.8°F | Ht 66.0 in | Wt 211.6 lb

## 2017-07-11 DIAGNOSIS — K219 Gastro-esophageal reflux disease without esophagitis: Secondary | ICD-10-CM

## 2017-07-11 NOTE — Progress Notes (Signed)
Kathleen Darby, MD [address]  [place]  [place], [place] [address]  Main: [phone]  Fax: [phone]   Gastroenterology Consultation  Referring Provider:     Einar Pheasant, MD Primary Care Physician:  Einar Pheasant, MD Primary Gastroenterologist:  Dr. Sherri Sear Reason for Consultation:  Throat discomfort        HPI:   Kathleen Gould is a 51 y.o. y/o female referred for consultation & management  by Dr. Einar Pheasant, MD. Kathleen Gould reports about 2-3 years history of intermittent heartburn and has been on over-the-counter omeprazole once daily which provided some relief. But in last few months she has been suffering from constant drainage in her throat and soreness in her throat. Although she denies any difficulty swallowing both to solids and liquids, she does feel there is something in her throat that interferes with swallowing.She thought that throat drainage was secondary to sinus infection and she received antibiotics, fluticasone nasal spray but with no relief. She started taking Zegerid early in the morning one pill a day. She does not smoke or drink alcohol. She denies any other upper GI symptoms. She denies fever, chills, nausea, vomiting, loss of appetite, weight loss. She lost her husband in November from cardiac arrest. She had a colonoscopy at age 77 and denies any history of polyps. She never had an upper endoscopy in the past and we do not know her H. pylori status. She denies taking NSAIDs. Or over-the-counter herbal supplements.   Follow-up visit 07/11/2017: She continues to have above symptoms and they have unchanged despite being on omeprazole 40 mg twice daily.  She is also following antireflux measures.  She reports feeling short of breath only when talking and she was recently seen by cardiologist and workup was negative.  She was also seen by ENT and laryngoscopy was reportedly normal.  When I asked her about how she is coping up with her recent  loss of husband and she became tearful.  She thinks she is depressed as she lives by herself.  Apparently, Dr. Nicki Reaper has started her on Zoloft and she has not started taking it yet.   Past Medical History:  Diagnosis Date  . Arthritis   . Endometriosis   . Environmental allergies   . GERD (gastroesophageal reflux disease)   . Hypercholesterolemia   . Hypertension   . Uterine fibroid     Past Surgical History:  Procedure Laterality Date  . ABDOMINAL HYSTERECTOMY    . CESAREAN SECTION     x2  . OOPHORECTOMY     S/P left secondary to cyst    Current Outpatient Medications:  .  atorvastatin (LIPITOR) 20 MG tablet, TAKE 1 TABLET (20 MG TOTAL) BY MOUTH DAILY., Disp: 30 tablet, Rfl: 5 .  felodipine (PLENDIL) 5 MG 24 hr tablet, TAKE 1 TABLET (5 MG TOTAL) BY MOUTH DAILY., Disp: 90 tablet, Rfl: 1 .  fluticasone (FLONASE) 50 MCG/ACT nasal spray, Place 2 sprays into both nostrils daily., Disp: 16 g, Rfl: 5 .  omeprazole (PRILOSEC) 40 MG capsule, Take 1 capsule (40 mg total) by mouth daily., Disp: 90 capsule, Rfl: 1 .  sertraline (ZOLOFT) 25 MG tablet, Take 1 tablet (25 mg total) by mouth daily., Disp: 30 tablet, Rfl: 2 .  triamterene-hydrochlorothiazide (MAXZIDE-25) 37.5-25 MG tablet, TAKE 0.5 TABLETS BY MOUTH DAILY., Disp: 30 tablet, Rfl: 5   Family History  Problem Relation Age of Onset  . Heart attack Father 52  . Heart disease Father  bypass surgery  . Hypertension Father   . Hypercholesterolemia Father   . Kidney disease Father   . Diabetes Mother   . Diabetes Unknown        aunt  . Breast cancer Neg Hx      Social History   Tobacco Use  . Smoking status: Never Smoker  . Smokeless tobacco: Never Used  Substance Use Topics  . Alcohol use: No    Alcohol/week: 0.0 oz  . Drug use: No    Allergies as of 07/11/2017 - Review Complete 07/11/2017  Allergen Reaction Noted  . Cefdinir Itching 02/27/2015  . Levofloxacin Other (See Comments) 11/12/2016  . Simvastatin  Other (See Comments) 11/05/2012    Review of Systems:    All systems reviewed and negative except where noted in HPI.   Physical Exam:  BP 125/83   Pulse 74   Temp 97.8 F (36.6 C) (Oral)   Ht 5\' 6"  (1.676 m)   Wt 211 lb 9.6 oz (96 kg)   LMP 11/06/2000   BMI 34.15 kg/m  Patient's last menstrual period was 11/06/2000. Psych:  Alert and cooperative. Normal mood and affect. General:   Alert,  Well-developed, well-nourished, pleasant and cooperative in NAD Head:  Normocephalic and atraumatic. Eyes:  Sclera clear, no icterus.   Conjunctiva pink. Ears:  Normal auditory acuity. Nose:  No deformity, discharge, or lesions. Mouth:  No deformity or lesions,oropharynx pink & moist. Neck:  Supple; no masses or thyromegaly. Lungs:  Respirations even and unlabored.  Clear throughout to auscultation.   No wheezes, crackles, or rhonchi. No acute distress. Heart:  Regular rate and rhythm; no murmurs, clicks, rubs, or gallops. Abdomen:  Normal bowel sounds.  No bruits.  Soft, non-tender and non-distended without masses, hepatosplenomegaly or hernias noted.  No guarding or rebound tenderness.    Msk:  Symmetrical without gross deformities. Good, equal movement & strength bilaterally. Pulses:  Normal pulses noted. Extremities:  No clubbing or edema.  No cyanosis. Neurologic:  Alert and oriented x3;  grossly normal neurologically. Skin:  Intact without significant lesions or rashes. No jaundice. Lymph Nodes:  No significant cervical adenopathy. Psych:  Alert and cooperative. Normal mood and affect.  Imaging Studies: Reviewed CT soft tissue neck, chest from June 2018 and the questionable soft tissue mass in the neck was thought to be secondary to atherosclerotic calcification in the carotid artery.  Assessment and Plan:   Kathleen Gould is a 51 y.o. female is here for follow-up of chronic intermittent heartburn and constant drainage in her throat.  Cardiac and ENT workup is negative.  She is  also currently feeling depressed from loss of her husband and probably she is going through prolonged grief reaction.  I suspect her upper GI symptoms are most likely functional and I reinforced her to start taking Zoloft.  She will continue omeprazole 40 mg twice daily and add ranitidine at bedtime and during the day if needed.  I discussed with her about upper endoscopy given ongoing symptoms for further evaluation.  She said she would like to get her depression under control and if her symptoms are persistent, then she would like to pursue with EGD.  And, I think it is reasonable.    Follow up in 3 months.  Dr. Sherri Sear

## 2017-08-18 ENCOUNTER — Ambulatory Visit: Payer: BC Managed Care – PPO | Admitting: Internal Medicine

## 2017-08-18 ENCOUNTER — Encounter: Payer: Self-pay | Admitting: Internal Medicine

## 2017-08-18 DIAGNOSIS — E7800 Pure hypercholesterolemia, unspecified: Secondary | ICD-10-CM | POA: Diagnosis not present

## 2017-08-18 DIAGNOSIS — K219 Gastro-esophageal reflux disease without esophagitis: Secondary | ICD-10-CM | POA: Diagnosis not present

## 2017-08-18 DIAGNOSIS — M25512 Pain in left shoulder: Secondary | ICD-10-CM | POA: Diagnosis not present

## 2017-08-18 DIAGNOSIS — J0100 Acute maxillary sinusitis, unspecified: Secondary | ICD-10-CM | POA: Diagnosis not present

## 2017-08-18 DIAGNOSIS — I10 Essential (primary) hypertension: Secondary | ICD-10-CM | POA: Diagnosis not present

## 2017-08-18 DIAGNOSIS — D472 Monoclonal gammopathy: Secondary | ICD-10-CM

## 2017-08-18 DIAGNOSIS — F439 Reaction to severe stress, unspecified: Secondary | ICD-10-CM | POA: Diagnosis not present

## 2017-08-18 MED ORDER — AMOXICILLIN-POT CLAVULANATE 875-125 MG PO TABS: 1.0000 | ORAL_TABLET | Freq: Two times a day (BID) | ORAL | 0 refills | Status: DC

## 2017-08-18 NOTE — Patient Instructions (Signed)
Saline nasal spray - flush nose at least 2-3x/day  nasacort nasal spray - 2 sprays each nostril one time per day.  Do this in the evening.    Continue mucinex in the am and robitussin in the evening.    Take a probiotic while you are on the antibiotics and for two weeks after completing the antibiotics.    Examples of probiotics:  Culturelle, florastor or align.

## 2017-08-18 NOTE — Progress Notes (Signed)
Patient ID: Kathleen Gould, female   DOB: April 10, 1966, 51 y.o.   MRN: [id number]   Subjective:    Patient ID: Kathleen Gould, female    DOB: [birth year], 51 y.o.   MRN: [id number]  HPI  Patient here for a scheduled follow up.  She reports she fell two weeks ago.  Cough herself with her left hand/arm outstretched.  Wrist is better.  With persistent pain around her elbow and extending up to her shoulder.  Increased pain with full extension and abduction.  Taking alleve and icing.  Persistent pain.  Also reports increased cough and congestion.  Increased sinus pressure and fullness.  Nasal congestion and drainage.  No chest congestion.  States cough coming from the drainage.  Some blood tinged mucus.  Ear hurting some - full.     Past Medical History:  Diagnosis Date  . Arthritis   . Endometriosis   . Environmental allergies   . GERD (gastroesophageal reflux disease)   . Hypercholesterolemia   . Hypertension   . Uterine fibroid    Past Surgical History:  Procedure Laterality Date  . ABDOMINAL HYSTERECTOMY    . CESAREAN SECTION     x2  . OOPHORECTOMY     S/P left secondary to cyst  . THYROGLOSSAL DUCT CYST N/A 05/27/2015   Procedure: THYROGLOSSAL DUCT CYST EXCISION ;  Surgeon: Clyde Canterbury, MD;  Location: [facility] ORS;  Service: ENT;  Laterality: N/A;   Family History  Problem Relation Age of Onset  . Heart attack Father 49  . Heart disease Father        bypass surgery  . Hypertension Father   . Hypercholesterolemia Father   . Kidney disease Father   . Diabetes Mother   . Diabetes Unknown        aunt  . Breast cancer Neg Hx    Social History   Socioeconomic History  . Marital status: Married    Spouse name: None  . Number of children: 2  . Years of education: None  . Highest education level: None  Social Needs  . Financial resource strain: None  . Food insecurity - worry: None  . Food insecurity - inability: None  . Transportation needs - medical: None  .  Transportation needs - non-medical: None  Occupational History  . None  Tobacco Use  . Smoking status: Never Smoker  . Smokeless tobacco: Never Used  Substance and Sexual Activity  . Alcohol use: No    Alcohol/week: 0.0 oz  . Drug use: No  . Sexual activity: None  Other Topics Concern  . None  Social History Narrative  . None    Outpatient Encounter Medications as of 08/18/2017  Medication Sig  . atorvastatin (LIPITOR) 20 MG tablet TAKE 1 TABLET (20 MG TOTAL) BY MOUTH DAILY.  . felodipine (PLENDIL) 5 MG 24 hr tablet TAKE 1 TABLET (5 MG TOTAL) BY MOUTH DAILY.  . fluticasone (FLONASE) 50 MCG/ACT nasal spray Place 2 sprays into both nostrils daily.  Marland Kitchen omeprazole (PRILOSEC) 40 MG capsule Take 1 capsule (40 mg total) by mouth daily.  . sertraline (ZOLOFT) 25 MG tablet Take 1 tablet (25 mg total) by mouth daily.  Marland Kitchen triamterene-hydrochlorothiazide (MAXZIDE-25) 37.5-25 MG tablet TAKE 0.5 TABLETS BY MOUTH DAILY.  Marland Kitchen amoxicillin-clavulanate (AUGMENTIN) 875-125 MG tablet Take 1 tablet by mouth 2 (two) times daily.   No facility-administered encounter medications on file as of 08/18/2017.     Review of Systems  Constitutional: Negative for appetite change and  unexpected weight change.  HENT: Positive for congestion, postnasal drip and sinus pressure.   Respiratory: Positive for cough. Negative for chest tightness and shortness of breath.   Cardiovascular: Negative for chest pain, palpitations and leg swelling.  Gastrointestinal: Negative for abdominal pain, diarrhea, nausea and vomiting.  Genitourinary: Negative for difficulty urinating and dysuria.  Musculoskeletal: Negative for joint swelling and myalgias.       Left arm/shoulder pain as outlined.    Skin: Negative for color change and rash.  Neurological: Negative for dizziness, light-headedness and headaches.  Psychiatric/Behavioral: Negative for agitation and dysphoric mood.       Objective:    Physical Exam  Constitutional:  She appears well-developed and well-nourished. No distress.  HENT:  Mouth/Throat: Oropharynx is clear and moist.  TMs without erythema.  Nares - slightly erythematous turbinates.    Neck: Neck supple. No thyromegaly present.  Cardiovascular: Normal rate and regular rhythm.  Pulmonary/Chest: Breath sounds normal. No respiratory distress. She has no wheezes.  Abdominal: Soft. Bowel sounds are normal. There is no tenderness.  Musculoskeletal: She exhibits no edema or tenderness.  Increased pain with abduction of left arm - especially at or above 90 degrees.  Decreased pain with passive rom.   Lymphadenopathy:    She has no cervical adenopathy.  Skin: No rash noted. No erythema.  Psychiatric: She has a normal mood and affect. Her behavior is normal.    BP 138/80 (BP Location: Left Arm, Patient Position: Sitting, Cuff Size: Normal)   Pulse 85   Temp 97.8 F (36.6 C) (Oral)   Wt 208 lb 9.6 oz (94.6 kg)   LMP 11/06/2000   BMI 33.67 kg/m  Wt Readings from Last 3 Encounters:  08/18/17 208 lb 9.6 oz (94.6 kg)  07/11/17 211 lb 9.6 oz (96 kg)  06/29/17 214 lb (97.1 kg)     Lab Results  Component Value Date   WBC 5.9 11/15/2016   HGB 13.3 11/15/2016   HCT 39.7 11/15/2016   PLT 428 11/15/2016   GLUCOSE 97 03/17/2017   CHOL 222 (H) 03/17/2017   TRIG 91.0 03/17/2017   HDL 75.80 03/17/2017   LDLDIRECT 164.5 07/24/2013   LDLCALC 128 (H) 03/17/2017   ALT 14 03/17/2017   AST 12 03/17/2017   NA 140 03/17/2017   K 4.1 03/17/2017   CL 100 03/17/2017   CREATININE 0.75 03/17/2017   BUN 9 03/17/2017   CO2 32 03/17/2017   TSH 1.42 03/17/2017   INR 1.0 04/21/2014    Dg Neck Soft Tissue  Result Date: 02/15/2017 CLINICAL DATA:  Patient porch coming choked on chicken and underwent Heimlich maneuver 5 by Stentor. Patient currently has a sore throat and is concerned about aspiration or retained material in her throat. The patient reports having been coughing and trying to dislodged something.  History previous thyroid surgery. EXAM: NECK SOFT TISSUES - 1+ VIEW COMPARISON:  CT scan of the neck of May 01, 2015 and lateral view of the cervical spine of December 12, 2014. FINDINGS: The prevertebral soft tissue spaces exhibit normal thickness. The epiglottis is sharp. The hypopharynx heel airway is patent. There is a tiny radiodensity visible behind the tracheal air column which appears separate from tracheal cartilages which could reflect a faintly radiopaque foreign body. This lies anterior to the C5-6 disc level. IMPRESSION: Faint radiodensity that may lie in the proximal cervical esophagus measuring approximately 4 mm x 2 mm. It appears separate from the laryngeal cartilages. This could reflect a retained chicken bone  or cartilage fragment. Normal appearance of the epiglottis and hypopharynx. Electronically Signed   By: David  Martinique M.D.   On: 02/15/2017 15:23   Ct Soft Tissue Neck Wo Contrast  Result Date: 02/15/2017 CLINICAL DATA:  Choking event.  Rule out foreign body in esophagus. EXAM: CT NECK WITHOUT CONTRAST TECHNIQUE: Multidetector CT imaging of the neck was performed following the standard protocol without intravenous contrast. COMPARISON:  CT neck 05/01/2015, soft tissue neck 02/16/2015 FINDINGS: Pharynx and larynx: Normal. No mass or swelling. Negative for radiopaque foreign body. The density on the lateral soft tissue neck today represents atherosclerotic calcification in the right carotid. Salivary glands: No inflammation, mass, or stone. Thyroid: Negative Lymph nodes: Negative for adenopathy Vascular: Mild atherosclerotic calcification right carotid bifurcation Limited intracranial: Negative Visualized orbits: Negative Mastoids and visualized paranasal sinuses: Mild mucosal edema right maxillary sinus Skeleton: Mild degenerative change in the cervical spine. No acute skeletal abnormality. Upper chest: Chest CT from today reported separately Other: None IMPRESSION: No acute abnormality  than Questioned density in the region of the proximal esophagus on the lateral soft tissue neck from today represents atherosclerotic calcification in the right carotid artery. No radiopaque foreign body identified in the pharynx or proximal esophagus. Electronically Signed   By: Franchot Gallo M.D.   On: 02/15/2017 16:18   Ct Chest Wo Contrast  Result Date: 02/15/2017 CLINICAL DATA:  Sore throat after choking on food and Heimlich maneuver. Concern for aspiration. EXAM: CT CHEST WITHOUT CONTRAST TECHNIQUE: Multidetector CT imaging of the chest was performed following the standard protocol without IV contrast. COMPARISON:  Chest radiograph 02/08/2017 FINDINGS: Cardiovascular: No significant vascular findings. Normal heart size. No pericardial effusion. Mediastinum/Nodes: No significant lymph node enlargement. The configuration of the gas within the esophagus is slightly irregular in the mid chest near the carina on sequence 3, image 53. This could represent normal collapsed esophagus but limited evaluation on this noncontrast examination. No evidence for pneumomediastinum. Thyroid tissue is unremarkable. Lungs/Pleura: Trachea and mainstem bronchi are patent. No large airway obstruction. Small nodular density in the right middle lobe on sequence 4, image 78 measures 4 mm. Lungs are clear without areas of airspace disease or consolidation. No pleural effusions. Upper Abdomen: No acute abnormality in the upper abdomen. Musculoskeletal: No suspicious bone findings. IMPRESSION: Appearance of the mid esophagus is slightly irregular but limited evaluation on this noncontrast examination. No evidence for pneumomediastinum. If there is clinical concern for an esophagus injury, recommend an esophagram or upper endoscopy. No evidence for pneumonia or aspiration. 4 mm nodule in the right middle lobe is indeterminate. No follow-up needed if patient is low-risk. Non-contrast chest CT can be considered in 12 months if patient  is high-risk. This recommendation follows the consensus statement: Guidelines for Management of Incidental Pulmonary Nodules Detected on CT Images: From the Fleischner Society 2017; Radiology 2017; 284:228-243. Electronically Signed   By: Markus Daft M.D.   On: 02/15/2017 17:24       Assessment & Plan:   Problem List Items Addressed This Visit    Esophageal reflux    Controlled on omeprazole.        Essential hypertension, benign    Blood pressure under good control.  Continue same medication regimen.  Follow pressures.  Follow metabolic panel.        Relevant Orders   Basic metabolic panel   Left shoulder pain    Persistent pain s/p fall.  She has tried antiinflammatories and ice.  Discussed further w/up and evaluation.  Will refer to ortho.        Relevant Orders   Ambulatory referral to Orthopedic Surgery   MGUS (monoclonal gammopathy of unknown significance)    Being followed by hematology.        Pure hypercholesterolemia    Low cholesterol diet and exercise.  Follow lipid panel.        Relevant Orders   Hepatic function panel   Lipid panel   Sinusitis    Symptoms as outlined.  Treat with nasacort nasal spray and saline nasal spray as outlined.  Mucinex/robitussin as directed.  Augmentin as directed.  Follow.        Relevant Medications   amoxicillin-clavulanate (AUGMENTIN) 875-125 MG tablet   Stress    Handling stress.            Einar Pheasant, MD

## 2017-08-21 DIAGNOSIS — M25512 Pain in left shoulder: Secondary | ICD-10-CM | POA: Insufficient documentation

## 2017-08-21 NOTE — Assessment & Plan Note (Signed)
Controlled on omeprazole.   

## 2017-08-21 NOTE — Assessment & Plan Note (Signed)
Symptoms as outlined.  Treat with nasacort nasal spray and saline nasal spray as outlined.  Mucinex/robitussin as directed.  Augmentin as directed.  Follow.

## 2017-08-21 NOTE — Assessment & Plan Note (Signed)
Being followed by hematology.   

## 2017-08-21 NOTE — Assessment & Plan Note (Signed)
Handling stress.

## 2017-08-21 NOTE — Assessment & Plan Note (Signed)
Persistent pain s/p fall.  She has tried antiinflammatories and ice.  Discussed further w/up and evaluation.  Will refer to ortho.

## 2017-08-21 NOTE — Assessment & Plan Note (Signed)
Blood pressure under good control.  Continue same medication regimen.  Follow pressures.  Follow metabolic panel.   

## 2017-08-21 NOTE — Assessment & Plan Note (Signed)
Low cholesterol diet and exercise.  Follow lipid panel.   

## 2017-09-08 ENCOUNTER — Other Ambulatory Visit: Payer: Self-pay | Admitting: Orthopedic Surgery

## 2017-09-08 DIAGNOSIS — M25512 Pain in left shoulder: Secondary | ICD-10-CM

## 2017-09-14 ENCOUNTER — Ambulatory Visit
Admission: RE | Admit: 2017-09-14 | Discharge: 2017-09-14 | Disposition: A | Discharge status: Home / Self Care | Payer: BC Managed Care – PPO | Source: Ambulatory Visit | Attending: Orthopedic Surgery | Admitting: Orthopedic Surgery

## 2017-09-14 DIAGNOSIS — M75102 Unspecified rotator cuff tear or rupture of left shoulder, not specified as traumatic: Secondary | ICD-10-CM | POA: Diagnosis not present

## 2017-09-14 DIAGNOSIS — M25522 Pain in left elbow: Secondary | ICD-10-CM | POA: Insufficient documentation

## 2017-09-14 DIAGNOSIS — M25512 Pain in left shoulder: Secondary | ICD-10-CM | POA: Diagnosis present

## 2017-09-14 DIAGNOSIS — R937 Abnormal findings on diagnostic imaging of other parts of musculoskeletal system: Secondary | ICD-10-CM | POA: Diagnosis not present

## 2017-10-09 ENCOUNTER — Other Ambulatory Visit: Payer: Self-pay | Admitting: Internal Medicine

## 2017-10-09 DIAGNOSIS — Z1231 Encounter for screening mammogram for malignant neoplasm of breast: Secondary | ICD-10-CM

## 2017-10-19 ENCOUNTER — Telehealth: Payer: Self-pay | Admitting: Gastroenterology

## 2017-10-19 NOTE — Telephone Encounter (Signed)
LVM to call our office for recall appt.

## 2017-11-07 ENCOUNTER — Other Ambulatory Visit (INDEPENDENT_AMBULATORY_CARE_PROVIDER_SITE_OTHER): Payer: BC Managed Care – PPO

## 2017-11-07 DIAGNOSIS — I10 Essential (primary) hypertension: Secondary | ICD-10-CM | POA: Diagnosis not present

## 2017-11-07 DIAGNOSIS — E7800 Pure hypercholesterolemia, unspecified: Secondary | ICD-10-CM

## 2017-11-07 LAB — BASIC METABOLIC PANEL
BUN: 11 mg/dL (ref 6–23)
CALCIUM: 10.1 mg/dL (ref 8.4–10.5)
CHLORIDE: 102 meq/L (ref 96–112)
CO2: 31 meq/L (ref 19–32)
Creatinine, Ser: 0.7 mg/dL (ref 0.40–1.20)
GFR: 112.37 mL/min (ref >60.00)
Glucose, Bld: 96 mg/dL (ref 70–99)
Potassium: 3.3 mEq/L — ABNORMAL LOW (ref 3.5–5.1)
SODIUM: 139 meq/L (ref 135–145)

## 2017-11-07 LAB — HEPATIC FUNCTION PANEL
ALBUMIN: 4.3 g/dL (ref 3.5–5.2)
ALT: 14 U/L (ref 0–35)
AST: 12 U/L (ref 0–37)
Alkaline Phosphatase: 60 U/L (ref 39–117)
Bilirubin, Direct: 0.1 mg/dL (ref 0.0–0.3)
TOTAL PROTEIN: 7.5 g/dL (ref 6.0–8.3)
Total Bilirubin: 0.6 mg/dL (ref 0.2–1.2)

## 2017-11-07 LAB — LIPID PANEL
CHOL/HDL RATIO: 3
Cholesterol: 246 mg/dL — ABNORMAL HIGH (ref 0–200)
HDL: 83.8 mg/dL (ref >39.00)
LDL CALC: 150 mg/dL — AB (ref 0–99)
NONHDL: 162.11
TRIGLYCERIDES: 63 mg/dL (ref 0.0–149.0)
VLDL: 12.6 mg/dL (ref 0.0–40.0)

## 2017-11-10 ENCOUNTER — Ambulatory Visit (INDEPENDENT_AMBULATORY_CARE_PROVIDER_SITE_OTHER): Payer: BC Managed Care – PPO | Admitting: Internal Medicine

## 2017-11-10 DIAGNOSIS — F439 Reaction to severe stress, unspecified: Secondary | ICD-10-CM

## 2017-11-10 DIAGNOSIS — I10 Essential (primary) hypertension: Secondary | ICD-10-CM | POA: Diagnosis not present

## 2017-11-10 DIAGNOSIS — D472 Monoclonal gammopathy: Secondary | ICD-10-CM | POA: Diagnosis not present

## 2017-11-10 DIAGNOSIS — Z0000 Encounter for general adult medical examination without abnormal findings: Secondary | ICD-10-CM

## 2017-11-10 DIAGNOSIS — M25512 Pain in left shoulder: Secondary | ICD-10-CM

## 2017-11-10 DIAGNOSIS — Z9109 Other allergy status, other than to drugs and biological substances: Secondary | ICD-10-CM

## 2017-11-10 DIAGNOSIS — D649 Anemia, unspecified: Secondary | ICD-10-CM

## 2017-11-10 DIAGNOSIS — K219 Gastro-esophageal reflux disease without esophagitis: Secondary | ICD-10-CM | POA: Diagnosis not present

## 2017-11-10 DIAGNOSIS — E7800 Pure hypercholesterolemia, unspecified: Secondary | ICD-10-CM | POA: Diagnosis not present

## 2017-11-10 MED ORDER — FLUTICASONE PROPIONATE 50 MCG/ACT NA SUSP: 2.0000 | Freq: Every day | NASAL | 5 refills | Status: DC

## 2017-11-10 MED ORDER — FELODIPINE ER 5 MG PO TB24: ORAL_TABLET | ORAL | 1 refills | Status: DC

## 2017-11-10 MED ORDER — ATORVASTATIN CALCIUM 20 MG PO TABS: 20.0000 mg | ORAL_TABLET | Freq: Every day | ORAL | 5 refills | Status: DC

## 2017-11-10 MED ORDER — TRIAMTERENE-HCTZ 37.5-25 MG PO TABS: ORAL_TABLET | ORAL | 5 refills | Status: DC

## 2017-11-10 NOTE — Progress Notes (Signed)
Patient ID: Kathleen Gould, female   DOB: 1965-09-18, 52 y.o.   MRN: [id number]   Subjective:    Patient ID: Kathleen Gould, female    DOB: May 08, 1966, 52 y.o.   MRN: [id number]  HPI  Patient here for her physical exam.  She reports she is doing relatively well.  Handling stress.  Has good support.  Trying to stay active.  No chest pain.  No sob.  No acid reflux.  No abdominal pain.  Bowels moving.  No urine change.  Has left rotator cuff tear.  Going to therapy.  Has a good rom.  Still with some limitations/pain.  Planning to f/u with ortho.  Discussed labs.  Discussed cholesterol.  Discussed diet and exercise.  Follow.     Past Medical History:  Diagnosis Date  . Arthritis   . Endometriosis   . Environmental allergies   . GERD (gastroesophageal reflux disease)   . Hypercholesterolemia   . Hypertension   . Uterine fibroid    Past Surgical History:  Procedure Laterality Date  . ABDOMINAL HYSTERECTOMY    . CESAREAN SECTION     x2  . OOPHORECTOMY     S/P left secondary to cyst  . THYROGLOSSAL DUCT CYST N/A 05/27/2015   Procedure: THYROGLOSSAL DUCT CYST EXCISION ;  Surgeon: Clyde Canterbury, MD;  Location: [facility] ORS;  Service: ENT;  Laterality: N/A;   Family History  Problem Relation Age of Onset  . Heart attack Father 96  . Heart disease Father        bypass surgery  . Hypertension Father   . Hypercholesterolemia Father   . Kidney disease Father   . Diabetes Mother   . Diabetes Unknown        aunt  . Breast cancer Neg Hx    Social History   Socioeconomic History  . Marital status: Married    Spouse name: None  . Number of children: 2  . Years of education: None  . Highest education level: None  Social Needs  . Financial resource strain: None  . Food insecurity - worry: None  . Food insecurity - inability: None  . Transportation needs - medical: None  . Transportation needs - non-medical: None  Occupational History  . None  Tobacco Use  . Smoking status:  Never Smoker  . Smokeless tobacco: Never Used  Substance and Sexual Activity  . Alcohol use: No    Alcohol/week: 0.0 oz  . Drug use: No  . Sexual activity: None  Other Topics Concern  . None  Social History Narrative  . None    Outpatient Encounter Medications as of 11/10/2017  Medication Sig  . atorvastatin (LIPITOR) 20 MG tablet Take 1 tablet (20 mg total) by mouth daily.  . felodipine (PLENDIL) 5 MG 24 hr tablet TAKE 1 TABLET (5 MG TOTAL) BY MOUTH DAILY.  . fluticasone (FLONASE) 50 MCG/ACT nasal spray Place 2 sprays into both nostrils daily.  Marland Kitchen omeprazole (PRILOSEC) 40 MG capsule Take 1 capsule (40 mg total) by mouth daily.  Marland Kitchen triamterene-hydrochlorothiazide (MAXZIDE-25) 37.5-25 MG tablet TAKE 0.5 TABLETS BY MOUTH DAILY.  . [DISCONTINUED] amoxicillin-clavulanate (AUGMENTIN) 875-125 MG tablet Take 1 tablet by mouth 2 (two) times daily.  . [DISCONTINUED] atorvastatin (LIPITOR) 20 MG tablet TAKE 1 TABLET (20 MG TOTAL) BY MOUTH DAILY.  . [DISCONTINUED] felodipine (PLENDIL) 5 MG 24 hr tablet TAKE 1 TABLET (5 MG TOTAL) BY MOUTH DAILY.  . [DISCONTINUED] fluticasone (FLONASE) 50 MCG/ACT nasal spray Place 2 sprays into  both nostrils daily.  . [DISCONTINUED] sertraline (ZOLOFT) 25 MG tablet Take 1 tablet (25 mg total) by mouth daily.  . [DISCONTINUED] triamterene-hydrochlorothiazide (MAXZIDE-25) 37.5-25 MG tablet TAKE 0.5 TABLETS BY MOUTH DAILY.   No facility-administered encounter medications on file as of 11/10/2017.     Review of Systems  Constitutional: Negative for appetite change and unexpected weight change.  HENT: Negative for congestion and sinus pressure.   Eyes: Negative for pain and visual disturbance.  Respiratory: Negative for cough, chest tightness and shortness of breath.   Cardiovascular: Negative for chest pain, palpitations and leg swelling.  Gastrointestinal: Negative for abdominal pain, diarrhea, nausea and vomiting.  Genitourinary: Negative for difficulty urinating  and dysuria.  Musculoskeletal: Negative for back pain and joint swelling.  Skin: Negative for color change and rash.  Neurological: Negative for dizziness and headaches.  Hematological: Negative for adenopathy. Does not bruise/bleed easily.  Psychiatric/Behavioral: Negative for decreased concentration and dysphoric mood.       Objective:     Blood pressure rechecked by me:  120/72  Physical Exam  Constitutional: She is oriented to person, place, and time. She appears well-developed and well-nourished. No distress.  HENT:  Nose: Nose normal.  Mouth/Throat: Oropharynx is clear and moist.  Eyes: Right eye exhibits no discharge. Left eye exhibits no discharge. No scleral icterus.  Neck: Neck supple. No thyromegaly present.  Cardiovascular: Normal rate and regular rhythm.  Pulmonary/Chest: Breath sounds normal. No accessory muscle usage. No tachypnea. No respiratory distress. She has no decreased breath sounds. She has no wheezes. She has no rhonchi. Right breast exhibits no inverted nipple, no mass, no nipple discharge and no tenderness (no axillary adenopathy). Left breast exhibits no inverted nipple, no mass, no nipple discharge and no tenderness (no axilarry adenopathy).  Abdominal: Soft. Bowel sounds are normal. There is no tenderness.  Musculoskeletal: She exhibits no edema or tenderness.  Lymphadenopathy:    She has no cervical adenopathy.  Neurological: She is alert and oriented to person, place, and time.  Skin: Skin is warm. No rash noted. No erythema.  Psychiatric: She has a normal mood and affect. Her behavior is normal.    BP 120/72   Pulse 90   Temp 98 F (36.7 C) (Oral)   Resp 18   Wt 209 lb (94.8 kg)   LMP 11/06/2000   SpO2 98%   BMI 33.73 kg/m  Wt Readings from Last 3 Encounters:  11/10/17 209 lb (94.8 kg)  08/18/17 208 lb 9.6 oz (94.6 kg)  07/11/17 211 lb 9.6 oz (96 kg)     Lab Results  Component Value Date   WBC 5.9 11/15/2016   HGB 13.3 11/15/2016    HCT 39.7 11/15/2016   PLT 428 11/15/2016   GLUCOSE 96 11/07/2017   CHOL 246 (H) 11/07/2017   TRIG 63.0 11/07/2017   HDL 83.80 11/07/2017   LDLDIRECT 164.5 07/24/2013   LDLCALC 150 (H) 11/07/2017   ALT 14 11/07/2017   AST 12 11/07/2017   NA 139 11/07/2017   K 3.3 (L) 11/07/2017   CL 102 11/07/2017   CREATININE 0.70 11/07/2017   BUN 11 11/07/2017   CO2 31 11/07/2017   TSH 1.42 03/17/2017   INR 1.0 04/21/2014    Mr Shoulder Left Wo Contrast  Result Date: 09/14/2017 CLINICAL DATA:  Status post fall 1 month ago. Left shoulder pain limited range of motion. EXAM: MRI OF THE LEFT SHOULDER WITHOUT CONTRAST TECHNIQUE: Multiplanar, multisequence MR imaging of the shoulder was performed. No intravenous  contrast was administered. COMPARISON:  None. FINDINGS: Rotator cuff: The patient has supraspinatus and infraspinatus tendinopathy. There is a small tear of the leading edge of the supraspinatus measuring approximately 0.4 cm from front to back. No retraction or atrophy. The rotator cuff is otherwise unremarkable. Muscles:  Normal without atrophy or focal lesion. Biceps long head:  Intact. Acromioclavicular Joint: Appears normal. Type 2 acromion. A large volume of fluid is seen in the subacromial/subdeltoid bursa. Glenohumeral Joint: Appears normal. Labrum:  Intact. Bones:  No fracture or worrisome lesion. Other: None. IMPRESSION: Supraspinatus and infraspinatus tendinopathy with a very small tear of the leading edge of the supraspinatus without retraction or atrophy. Prominent subacromial/subdeltoid fluid consistent with bursitis. Electronically Signed   By: Inge Rise M.D.   On: 09/14/2017 15:34       Assessment & Plan:   Problem List Items Addressed This Visit    Anemia    Followed by hematology.        Environmental allergies    Controlled on current regimen.  Follow.        Esophageal reflux    Controlled on omeprazole.        Essential hypertension, benign    Blood pressure on  recheck wnl.  Continue same medication regimen.  Follow pressures.  Follow metabolic panel.        Relevant Medications   felodipine (PLENDIL) 5 MG 24 hr tablet   triamterene-hydrochlorothiazide (MAXZIDE-25) 37.5-25 MG tablet   atorvastatin (LIPITOR) 20 MG tablet   Health care maintenance    Physical today 11/10/17.  Colonoscopy 02/2008.  Mammogram 11/15/16 - birads I.        Left shoulder pain    With rotator cuff tear.  Going to therapy.  Plans to f/u with ortho.        MGUS (monoclonal gammopathy of unknown significance)    Followed by hematology.        Pure hypercholesterolemia    Discussed recent cholesterol labs.  Low cholesterol diet and exercise.  Follow lipid panel.        Relevant Medications   felodipine (PLENDIL) 5 MG 24 hr tablet   triamterene-hydrochlorothiazide (MAXZIDE-25) 37.5-25 MG tablet   atorvastatin (LIPITOR) 20 MG tablet   Stress    Increased stress as outlined.  Discussed with her today. She feels she is handling things relatively well.  Follow.            Einar Pheasant, MD

## 2017-11-10 NOTE — Assessment & Plan Note (Signed)
Physical today 11/10/17.  Colonoscopy 02/2008.  Mammogram 11/15/16 - birads I.

## 2017-11-12 ENCOUNTER — Encounter: Payer: Self-pay | Admitting: Internal Medicine

## 2017-11-12 NOTE — Assessment & Plan Note (Signed)
Controlled on omeprazole.   

## 2017-11-12 NOTE — Assessment & Plan Note (Signed)
Followed by hematology 

## 2017-11-12 NOTE — Assessment & Plan Note (Signed)
With rotator cuff tear.  Going to therapy.  Plans to f/u with ortho.

## 2017-11-12 NOTE — Assessment & Plan Note (Signed)
Blood pressure on recheck wnl.  Continue same medication regimen.  Follow pressures.  Follow metabolic panel.   

## 2017-11-12 NOTE — Assessment & Plan Note (Signed)
Increased stress as outlined.  Discussed with her today.  She feels she is handling things relatively well.  Follow.   

## 2017-11-12 NOTE — Assessment & Plan Note (Signed)
Controlled on current regimen.  Follow.  

## 2017-11-12 NOTE — Assessment & Plan Note (Signed)
Discussed recent cholesterol labs.  Low cholesterol diet and exercise.  Follow lipid panel.  

## 2017-11-14 ENCOUNTER — Other Ambulatory Visit: Payer: Self-pay

## 2017-11-14 ENCOUNTER — Inpatient Hospital Stay: Payer: BC Managed Care – PPO | Attending: Internal Medicine | Admitting: Internal Medicine

## 2017-11-14 ENCOUNTER — Inpatient Hospital Stay: Payer: BC Managed Care – PPO

## 2017-11-14 VITALS — BP 133/97 | HR 85 | Temp 97.9°F | Resp 20 | Ht 66.0 in | Wt 209.0 lb

## 2017-11-14 DIAGNOSIS — R911 Solitary pulmonary nodule: Secondary | ICD-10-CM | POA: Diagnosis not present

## 2017-11-14 DIAGNOSIS — D472 Monoclonal gammopathy: Secondary | ICD-10-CM

## 2017-11-14 DIAGNOSIS — Z79899 Other long term (current) drug therapy: Secondary | ICD-10-CM | POA: Diagnosis not present

## 2017-11-14 DIAGNOSIS — E7800 Pure hypercholesterolemia, unspecified: Secondary | ICD-10-CM | POA: Diagnosis not present

## 2017-11-14 DIAGNOSIS — K219 Gastro-esophageal reflux disease without esophagitis: Secondary | ICD-10-CM | POA: Diagnosis not present

## 2017-11-14 DIAGNOSIS — I10 Essential (primary) hypertension: Secondary | ICD-10-CM | POA: Insufficient documentation

## 2017-11-14 LAB — CBC WITH DIFFERENTIAL/PLATELET
BASOS PCT: 1 %
Basophils Absolute: 0.1 10*3/uL (ref 0–0.1)
Eosinophils Absolute: 0.1 10*3/uL (ref 0–0.7)
Eosinophils Relative: 2 %
HEMATOCRIT: 40.9 % (ref 35.0–47.0)
HEMOGLOBIN: 13.7 g/dL (ref 12.0–16.0)
Lymphocytes Relative: 34 %
Lymphs Abs: 1.9 10*3/uL (ref 1.0–3.6)
MCH: 28.8 pg (ref 26.0–34.0)
MCHC: 33.6 g/dL (ref 32.0–36.0)
MCV: 85.7 fL (ref 80.0–100.0)
Monocytes Absolute: 0.3 10*3/uL (ref 0.2–0.9)
Monocytes Relative: 5 %
NEUTROS ABS: 3.2 10*3/uL (ref 1.4–6.5)
Neutrophils Relative %: 58 %
Platelets: 425 10*3/uL (ref 150–440)
RBC: 4.77 MIL/uL (ref 3.80–5.20)
RDW: 14.7 % — ABNORMAL HIGH (ref 11.5–14.5)
WBC: 5.5 10*3/uL (ref 3.6–11.0)

## 2017-11-14 LAB — COMPREHENSIVE METABOLIC PANEL
ALK PHOS: 73 U/L (ref 38–126)
ALT: 15 U/L (ref 14–54)
ANION GAP: 7 (ref 5–15)
AST: 16 U/L (ref 15–41)
Albumin: 4.3 g/dL (ref 3.5–5.0)
BUN: 13 mg/dL (ref 6–20)
CALCIUM: 9.6 mg/dL (ref 8.9–10.3)
CO2: 29 mmol/L (ref 22–32)
Chloride: 102 mmol/L (ref 101–111)
Creatinine, Ser: 0.7 mg/dL (ref 0.44–1.00)
GFR calc non Af Amer: >60 mL/min (ref >60)
Glucose, Bld: 99 mg/dL (ref 65–99)
Potassium: 3.7 mmol/L (ref 3.5–5.1)
SODIUM: 138 mmol/L (ref 135–145)
TOTAL PROTEIN: 8 g/dL (ref 6.5–8.1)
Total Bilirubin: 0.5 mg/dL (ref 0.3–1.2)

## 2017-11-14 NOTE — Assessment & Plan Note (Addendum)
Asymptomatic. Patient's M protein in March 2018 0.3 g/dL normal kappa Lambda light chain ratio. CBC CMP today are normal. Myeloma panel/ lambda light chain monoclonal protein pending.  #I again had a long conversation with the patient and her friend regarding the pathophysiology of MGUS/small risk of turning into multiple myeloma about 1 %/year.   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    # Recommend follow-up in spring of 2020/ CBC CMP myeloma panel. Will call with results of MM panel/CT chest- July 2019.

## 2017-11-14 NOTE — Progress Notes (Signed)
[facility] OFFICE PROGRESS NOTE  Patient Care Team: Einar Pheasant, MD as PCP - General (Internal Medicine)  Cancer Staging No matching staging information was found for the patient.  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 Serum LDH, Cr, CBC, Flow Cytometry, Skeletal survey all unremarkable.  # RML lung nodule 34m [June 2018; incidental]   No history exists.     INTERVAL HISTORY:  [name] 52y.o.  female pleasant patient above history of MGUS is here for follow-up.   Patient states that she fell/tripped in a yard and hit her left shoulder; had MRI that showed a small tear.  She underwent physical therapy-noted to have improvement of the pain and range of motion.\  Patient denies any tingling and numbness. Denies any weight loss or unusual bone pain. No nausea no vomiting no weight loss.  Patient denies any unusual shortness of breath or cough.  Denies any history of smoking.  She has not had any significant secondhand smoke to smoking   REVIEW OF SYSTEMS:  A complete 10 point review of system is done which is negative except mentioned above/history of present illness.   PAST MEDICAL HISTORY :  Past Medical History:  Diagnosis Date  . Arthritis   . Endometriosis   . Environmental allergies   . GERD (gastroesophageal reflux disease)   . Hypercholesterolemia   . Hypertension   . Uterine fibroid     PAST SURGICAL HISTORY :   Past Surgical History:  Procedure Laterality Date  . ABDOMINAL HYSTERECTOMY    . CESAREAN SECTION     x2  . OOPHORECTOMY     S/P left secondary to cyst  . THYROGLOSSAL DUCT CYST N/A 05/27/2015   Procedure: THYROGLOSSAL DUCT CYST EXCISION ;  Surgeon: PClyde Canterbury MD;  Location: [facility] ORS;  Service: ENT;  Laterality: N/A;    FAMILY HISTORY :   Family History  Problem Relation Age of Onset  . Heart  attack Father 590 . Heart disease Father        bypass surgery  . Hypertension Father   . Hypercholesterolemia Father   . Kidney disease Father   . Diabetes Mother   . Diabetes Unknown        aunt  . Breast cancer Neg Hx     SOCIAL HISTORY:   Social History   Tobacco Use  . Smoking status: Never Smoker  . Smokeless tobacco: Never Used  Substance Use Topics  . Alcohol use: No    Alcohol/week: 0.0 oz  . Drug use: No    ALLERGIES:  is allergic to cefdinir; levofloxacin; and simvastatin.  MEDICATIONS:  Current Outpatient Medications  Medication Sig Dispense Refill  . atorvastatin (LIPITOR) 20 MG tablet Take 1 tablet (20 mg total) by mouth daily. 30 tablet 5  . felodipine (PLENDIL) 5 MG 24 hr tablet TAKE 1 TABLET (5 MG TOTAL) BY MOUTH DAILY. 90 tablet 1  . fluticasone (FLONASE) 50 MCG/ACT nasal spray Place 2 sprays into both nostrils daily. 16 g 5  . omeprazole (PRILOSEC) 40 MG capsule Take 1 capsule (40 mg total) by mouth daily. 90 capsule 1  . triamterene-hydrochlorothiazide (MAXZIDE-25) 37.5-25 MG tablet TAKE 0.5 TABLETS BY MOUTH DAILY. 30 tablet 5   No current facility-administered medications for this visit.     PHYSICAL EXAMINATION: ECOG PERFORMANCE STATUS: 0 - Asymptomatic  BP (!) 133/97 (BP Location: Right Arm,  Patient Position: Sitting)   Pulse 85   Temp 97.9 F (36.6 C) (Tympanic)   Resp 20   Ht 5' 6"  (1.676 m)   Wt 209 lb (94.8 kg)   LMP 11/06/2000   BMI 33.73 kg/m   Filed Weights   11/14/17 1500  Weight: 209 lb (94.8 kg)    GENERAL: Well-nourished well-developed; Alert, no distress and comfortable.  Alone.  EYES: no pallor or icterus OROPHARYNX: no thrush or ulceration; good dentition  NECK: supple, no masses felt LYMPH:  no palpable lymphadenopathy in the cervical, axillary or inguinal regions LUNGS: clear to auscultation and  No wheeze or crackles HEART/CVS: regular rate & rhythm and no murmurs; No lower extremity edema ABDOMEN:abdomen soft,  non-tender and normal bowel sounds Musculoskeletal:no cyanosis of digits and no clubbing  PSYCH: alert & oriented x 3 with fluent speech NEURO: no focal motor/sensory deficits SKIN:  no rashes or significant lesions  LABORATORY DATA:  I have reviewed the data as listed    Component Value Date/Time   NA 138 11/14/2017 1450   K 3.7 11/14/2017 1450   K 3.9 05/27/2015 0615   CL 102 11/14/2017 1450   CO2 29 11/14/2017 1450   GLUCOSE 99 11/14/2017 1450   BUN 13 11/14/2017 1450   CREATININE 0.70 11/14/2017 1450   CREATININE 0.81 04/21/2014 1629   CALCIUM 9.6 11/14/2017 1450   CALCIUM 9.3 04/21/2014 1629   PROT 8.0 11/14/2017 1450   ALBUMIN 4.3 11/14/2017 1450   AST 16 11/14/2017 1450   ALT 15 11/14/2017 1450   ALKPHOS 73 11/14/2017 1450   BILITOT 0.5 11/14/2017 1450   GFRNONAA >60 11/14/2017 1450   GFRNONAA >60 04/21/2014 1629   GFRAA >60 11/14/2017 1450   GFRAA >60 04/21/2014 1629    No results found for: SPEP, UPEP  Lab Results  Component Value Date   WBC 5.5 11/14/2017   NEUTROABS 3.2 11/14/2017   HGB 13.7 11/14/2017   HCT 40.9 11/14/2017   MCV 85.7 11/14/2017   PLT 425 11/14/2017      Chemistry      Component Value Date/Time   NA 138 11/14/2017 1450   K 3.7 11/14/2017 1450   K 3.9 05/27/2015 0615   CL 102 11/14/2017 1450   CO2 29 11/14/2017 1450   BUN 13 11/14/2017 1450   CREATININE 0.70 11/14/2017 1450   CREATININE 0.81 04/21/2014 1629      Component Value Date/Time   CALCIUM 9.6 11/14/2017 1450   CALCIUM 9.3 04/21/2014 1629   ALKPHOS 73 11/14/2017 1450   AST 16 11/14/2017 1450   ALT 15 11/14/2017 1450   BILITOT 0.5 11/14/2017 1450     4 mm nodule in the right middle lobe is indeterminate. No follow-up needed if patient is low-risk. Non-contrast chest CT can be considered in 12 months if patient is high-risk. This recommendation follows the consensus statement: Guidelines for Management of Incidental Pulmonary Nodules Detected on CT Images: From  the Fleischner Society 2017; Radiology 2017; 284:228-243.   Electronically Signed   By: Markus Daft M.D.   On: 02/15/2017 17:24  RADIOGRAPHIC STUDIES: I have personally reviewed the radiological images as listed and agreed with the findings in the report. No results found.   ASSESSMENT & PLAN:  MGUS (monoclonal gammopathy of unknown significance) Asymptomatic. Patient's M protein in March 2018 0.3 g/dL normal kappa Lambda light chain ratio. CBC CMP today are normal. Myeloma panel/ lambda light chain monoclonal protein pending.  #I again had a long conversation  with the patient and her friend regarding the pathophysiology of MGUS/small risk of turning into multiple myeloma about 1 %/year.   # RMLsub-cm/83m lung nodule;[ incidental inJune 2018]; Patient non-smoker; hence will follow recommendation of 12 months imaging recommend again imaging in July 2019.    # Recommend follow-up in spring of 2020/ CBC CMP myeloma panel. Will call with results of MM panel/CT chest- July 2019.    Orders Placed This Encounter  Procedures  . CT CHEST W CONTRAST    Standing Status:   Future    Standing Expiration Date:   11/15/2018    Order Specific Question:   If indicated for the ordered procedure, I authorize the administration of contrast media per Radiology protocol    Answer:   Yes    Order Specific Question:   Preferred imaging location?    Answer:   [facility]    Order Specific Question:   Radiology Contrast Protocol - do NOT remove file path    Answer:   [email or web address]    Order Specific Question:   Is patient pregnant?    Answer:   No  . CBC with Differential/Platelet    Standing Status:   Future    Standing Expiration Date:   11/15/2018  . Comprehensive metabolic panel    Standing Status:   Future    Standing Expiration Date:   11/15/2018  . Kappa/lambda light chains    Standing Status:   Future    Standing Expiration Date:   11/15/2018  . Multiple  Myeloma Panel (SPEP&IFE w/QIG)    Standing Status:   Future    Standing Expiration Date:   11/15/2018   All questions were answered. The patient knows to call the clinic with any problems, questions or concerns.      GCammie Sickle MD 11/14/2017 3:28 PM

## 2017-11-15 LAB — KAPPA/LAMBDA LIGHT CHAINS
KAPPA FREE LGHT CHN: 14.9 mg/L (ref 3.3–19.4)
KAPPA, LAMDA LIGHT CHAIN RATIO: 1.43 (ref 0.26–1.65)
LAMDA FREE LIGHT CHAINS: 10.4 mg/L (ref 5.7–26.3)

## 2017-11-16 LAB — MULTIPLE MYELOMA PANEL, SERUM
ALBUMIN SERPL ELPH-MCNC: 3.9 g/dL (ref 2.9–4.4)
ALPHA 1: 0.2 g/dL (ref 0.0–0.4)
Albumin/Glob SerPl: 1.2 (ref 0.7–1.7)
Alpha2 Glob SerPl Elph-Mcnc: 0.7 g/dL (ref 0.4–1.0)
B-Globulin SerPl Elph-Mcnc: 1.2 g/dL (ref 0.7–1.3)
GAMMA GLOB SERPL ELPH-MCNC: 1.2 g/dL (ref 0.4–1.8)
GLOBULIN, TOTAL: 3.3 g/dL (ref 2.2–3.9)
IGM (IMMUNOGLOBULIN M), SRM: 128 mg/dL (ref 26–217)
IgA: 262 mg/dL (ref 87–352)
IgG (Immunoglobin G), Serum: 1240 mg/dL (ref 700–1600)
M Protein SerPl Elph-Mcnc: 0.4 g/dL — ABNORMAL HIGH
TOTAL PROTEIN ELP: 7.2 g/dL (ref 6.0–8.5)

## 2017-11-20 ENCOUNTER — Telehealth: Payer: Self-pay | Admitting: *Deleted

## 2017-11-20 NOTE — Telephone Encounter (Signed)
-----   Message from Cammie Sickle, MD sent at 11/20/2017  7:40 AM EDT ----- Please inform patient that her M protein is slightly up; not significantly changed.   No new recommendations / Continue monitoring on annual basis. Thx

## 2017-11-20 NOTE — Telephone Encounter (Signed)
Left vm for patient. Also sent my chart msg to discuss results.

## 2017-11-23 ENCOUNTER — Ambulatory Visit
Admission: RE | Admit: 2017-11-23 | Discharge: 2017-11-23 | Disposition: A | Discharge status: Home / Self Care | Payer: BC Managed Care – PPO | Source: Ambulatory Visit | Attending: Internal Medicine | Admitting: Internal Medicine

## 2017-11-23 DIAGNOSIS — Z1231 Encounter for screening mammogram for malignant neoplasm of breast: Secondary | ICD-10-CM | POA: Diagnosis not present

## 2017-12-30 ENCOUNTER — Other Ambulatory Visit: Payer: Self-pay | Admitting: Internal Medicine

## 2017-12-30 DIAGNOSIS — K219 Gastro-esophageal reflux disease without esophagitis: Secondary | ICD-10-CM

## 2018-03-19 ENCOUNTER — Telehealth: Payer: Self-pay | Admitting: Radiology

## 2018-03-19 NOTE — Telephone Encounter (Signed)
Pt coming in for labs tomorrow, please place future orders. Thank you.  

## 2018-03-20 ENCOUNTER — Other Ambulatory Visit (INDEPENDENT_AMBULATORY_CARE_PROVIDER_SITE_OTHER): Payer: BC Managed Care – PPO

## 2018-03-20 ENCOUNTER — Other Ambulatory Visit: Payer: Self-pay | Admitting: Internal Medicine

## 2018-03-20 DIAGNOSIS — I10 Essential (primary) hypertension: Secondary | ICD-10-CM

## 2018-03-20 DIAGNOSIS — E7800 Pure hypercholesterolemia, unspecified: Secondary | ICD-10-CM | POA: Diagnosis not present

## 2018-03-20 LAB — BASIC METABOLIC PANEL
BUN: 11 mg/dL (ref 6–23)
CHLORIDE: 101 meq/L (ref 96–112)
CO2: 29 mEq/L (ref 19–32)
Calcium: 9.9 mg/dL (ref 8.4–10.5)
Creatinine, Ser: 0.83 mg/dL (ref 0.40–1.20)
GFR: 92.19 mL/min (ref >60.00)
GLUCOSE: 92 mg/dL (ref 70–99)
Potassium: 3.7 mEq/L (ref 3.5–5.1)
Sodium: 138 mEq/L (ref 135–145)

## 2018-03-20 LAB — HEPATIC FUNCTION PANEL
ALBUMIN: 4.2 g/dL (ref 3.5–5.2)
ALT: 14 U/L (ref 0–35)
AST: 10 U/L (ref 0–37)
Alkaline Phosphatase: 69 U/L (ref 39–117)
Bilirubin, Direct: 0.1 mg/dL (ref 0.0–0.3)
TOTAL PROTEIN: 7.6 g/dL (ref 6.0–8.3)
Total Bilirubin: 0.6 mg/dL (ref 0.2–1.2)

## 2018-03-20 LAB — LIPID PANEL
CHOLESTEROL: 213 mg/dL — AB (ref 0–200)
HDL: 72.2 mg/dL (ref >39.00)
LDL CALC: 126 mg/dL — AB (ref 0–99)
NONHDL: 140.63
Total CHOL/HDL Ratio: 3
Triglycerides: 71 mg/dL (ref 0.0–149.0)
VLDL: 14.2 mg/dL (ref 0.0–40.0)

## 2018-03-20 LAB — TSH: TSH: 1.97 u[IU]/mL (ref 0.35–4.50)

## 2018-03-20 NOTE — Progress Notes (Signed)
Orders placed for labs

## 2018-03-20 NOTE — Telephone Encounter (Signed)
Orders placed for labs

## 2018-03-21 ENCOUNTER — Encounter: Payer: Self-pay | Admitting: Internal Medicine

## 2018-03-22 ENCOUNTER — Encounter: Payer: Self-pay | Admitting: Internal Medicine

## 2018-03-22 ENCOUNTER — Ambulatory Visit: Payer: BC Managed Care – PPO | Admitting: Internal Medicine

## 2018-03-22 ENCOUNTER — Telehealth: Payer: Self-pay | Admitting: Internal Medicine

## 2018-03-22 VITALS — BP 110/78 | HR 80 | Temp 97.8°F | Resp 18 | Wt 213.2 lb

## 2018-03-22 DIAGNOSIS — D472 Monoclonal gammopathy: Secondary | ICD-10-CM

## 2018-03-22 DIAGNOSIS — R9389 Abnormal findings on diagnostic imaging of other specified body structures: Secondary | ICD-10-CM

## 2018-03-22 DIAGNOSIS — F439 Reaction to severe stress, unspecified: Secondary | ICD-10-CM

## 2018-03-22 DIAGNOSIS — D649 Anemia, unspecified: Secondary | ICD-10-CM

## 2018-03-22 DIAGNOSIS — R911 Solitary pulmonary nodule: Secondary | ICD-10-CM

## 2018-03-22 DIAGNOSIS — R059 Cough, unspecified: Secondary | ICD-10-CM

## 2018-03-22 DIAGNOSIS — R05 Cough: Secondary | ICD-10-CM | POA: Diagnosis not present

## 2018-03-22 DIAGNOSIS — I10 Essential (primary) hypertension: Secondary | ICD-10-CM | POA: Diagnosis not present

## 2018-03-22 DIAGNOSIS — E7800 Pure hypercholesterolemia, unspecified: Secondary | ICD-10-CM

## 2018-03-22 MED ORDER — FLUTICASONE PROPIONATE 50 MCG/ACT NA SUSP: 2.0000 | Freq: Every day | NASAL | 5 refills | Status: DC

## 2018-03-22 MED ORDER — FELODIPINE ER 5 MG PO TB24: ORAL_TABLET | ORAL | 1 refills | Status: DC

## 2018-03-22 MED ORDER — ATORVASTATIN CALCIUM 20 MG PO TABS: 20.0000 mg | ORAL_TABLET | Freq: Every day | ORAL | 5 refills | Status: DC

## 2018-03-22 MED ORDER — TRIAMTERENE-HCTZ 37.5-25 MG PO TABS: ORAL_TABLET | ORAL | 5 refills | Status: DC

## 2018-03-22 NOTE — Telephone Encounter (Signed)
Left vm for patient that ct would need to be cnl. She will need cxr instead. Requested call back from patient.

## 2018-03-22 NOTE — Telephone Encounter (Signed)
Robin, please cnl ct scan next week.

## 2018-03-22 NOTE — Telephone Encounter (Signed)
The CT is cancelled.

## 2018-03-22 NOTE — Progress Notes (Signed)
Patient ID: CYNTHIE GARMON, female   DOB: [birth year], 52 y.o.   MRN: [id number]   Subjective:    Patient ID: SYNIAH BERNE, female    DOB: 11-21-1965, 52 y.o.   MRN: [id number]  HPI  Patient here for a scheduled follow up.  Just had f/u with cardiology (Dr Clayborn Bigness) 01/2018.  Felt stable.  Recommended f/u in one year.  Also is s/p left rotator cuff tear.  S/p injection.  Doing better.  Last saw ortho 01/04/18.  Stable.  Also saw Dr Rogue Bussing 10/2017 - f/u MGUS.  Stable.  Recommended f/u in one year.  Her main complaint is that of persistent cough and throat congestion. No significant sinus pressure.  No nasal congestion.  No fever.  No chest pain or sob.  Has seen GI.  On prilosec.  Denies any acid reflux.  Also has seen ENT.  Has tried nasal spray and antihistamines.  No change in symptoms.  Some choking sensation - occasionally.  No abdominal pain.  Bowels moving.  Has CT chest scheduled to be performed next week.      Past Medical History:  Diagnosis Date  . Arthritis   . Endometriosis   . Environmental allergies   . GERD (gastroesophageal reflux disease)   . Hypercholesterolemia   . Hypertension   . Uterine fibroid    Past Surgical History:  Procedure Laterality Date  . ABDOMINAL HYSTERECTOMY    . CESAREAN SECTION     x2  . OOPHORECTOMY     S/P left secondary to cyst  . THYROGLOSSAL DUCT CYST N/A 05/27/2015   Procedure: THYROGLOSSAL DUCT CYST EXCISION ;  Surgeon: Clyde Canterbury, MD;  Location: [facility] ORS;  Service: ENT;  Laterality: N/A;   Family History  Problem Relation Age of Onset  . Heart attack Father 24  . Heart disease Father        bypass surgery  . Hypertension Father   . Hypercholesterolemia Father   . Kidney disease Father   . Diabetes Mother   . Diabetes Unknown        aunt  . Breast cancer Neg Hx    Social History   Socioeconomic History  . Marital status: Married    Spouse name: Not on file  . Number of children: 2  . Years of education: Not on  file  . Highest education level: Not on file  Occupational History  . Not on file  Social Needs  . Financial resource strain: Not on file  . Food insecurity:    Worry: Not on file    Inability: Not on file  . Transportation needs:    Medical: Not on file    Non-medical: Not on file  Tobacco Use  . Smoking status: Never Smoker  . Smokeless tobacco: Never Used  Substance and Sexual Activity  . Alcohol use: No    Alcohol/week: 0.0 oz  . Drug use: No  . Sexual activity: Not on file  Lifestyle  . Physical activity:    Days per week: Not on file    Minutes per session: Not on file  . Stress: Not on file  Relationships  . Social connections:    Talks on phone: Not on file    Gets together: Not on file    Attends religious service: Not on file    Active member of club or organization: Not on file    Attends meetings of clubs or organizations: Not on file    Relationship status: Not  on file  Other Topics Concern  . Not on file  Social History Narrative  . Not on file    Outpatient Encounter Medications as of 03/22/2018  Medication Sig  . atorvastatin (LIPITOR) 20 MG tablet Take 1 tablet (20 mg total) by mouth daily.  . felodipine (PLENDIL) 5 MG 24 hr tablet TAKE 1 TABLET (5 MG TOTAL) BY MOUTH DAILY.  . fluticasone (FLONASE) 50 MCG/ACT nasal spray Place 2 sprays into both nostrils daily.  Marland Kitchen omeprazole (PRILOSEC) 40 MG capsule TAKE 1 CAPSULE BY MOUTH EVERY DAY  . triamterene-hydrochlorothiazide (MAXZIDE-25) 37.5-25 MG tablet TAKE 0.5 TABLETS BY MOUTH DAILY.  . [DISCONTINUED] atorvastatin (LIPITOR) 20 MG tablet Take 1 tablet (20 mg total) by mouth daily.  . [DISCONTINUED] felodipine (PLENDIL) 5 MG 24 hr tablet TAKE 1 TABLET (5 MG TOTAL) BY MOUTH DAILY.  . [DISCONTINUED] fluticasone (FLONASE) 50 MCG/ACT nasal spray Place 2 sprays into both nostrils daily.  . [DISCONTINUED] triamterene-hydrochlorothiazide (MAXZIDE-25) 37.5-25 MG tablet TAKE 0.5 TABLETS BY MOUTH DAILY.   No  facility-administered encounter medications on file as of 03/22/2018.     Review of Systems  Constitutional: Negative for appetite change and unexpected weight change.  HENT: Negative for sinus pressure.        Throat congestion as outlined.    Respiratory: Positive for cough. Negative for chest tightness and shortness of breath.   Cardiovascular: Negative for chest pain, palpitations and leg swelling.  Gastrointestinal: Negative for abdominal pain, diarrhea, nausea and vomiting.  Genitourinary: Negative for difficulty urinating and dysuria.  Musculoskeletal: Negative for joint swelling and myalgias.  Skin: Negative for color change and rash.  Neurological: Negative for dizziness, light-headedness and headaches.  Psychiatric/Behavioral: Negative for agitation and dysphoric mood.       Handling stress relatively well.  Does not feel needs anything more at this point.         Objective:    Physical Exam  Constitutional: She appears well-developed and well-nourished. No distress.  HENT:  Nose: Nose normal.  Mouth/Throat: Oropharynx is clear and moist.  Neck: Neck supple. No thyromegaly present.  Cardiovascular: Normal rate and regular rhythm.  Pulmonary/Chest: Breath sounds normal. No respiratory distress. She has no wheezes.  Abdominal: Soft. Bowel sounds are normal. There is no tenderness.  Musculoskeletal: She exhibits no edema or tenderness.  Lymphadenopathy:    She has no cervical adenopathy.  Skin: No rash noted. No erythema.  Psychiatric: She has a normal mood and affect. Her behavior is normal.    BP 110/78 (BP Location: Left Arm, Patient Position: Sitting, Cuff Size: Large)   Pulse 80   Temp 97.8 F (36.6 C) (Oral)   Resp 18   Wt 213 lb 3.2 oz (96.7 kg)   LMP 11/06/2000   SpO2 97%   BMI 34.41 kg/m  Wt Readings from Last 3 Encounters:  03/22/18 213 lb 3.2 oz (96.7 kg)  11/14/17 209 lb (94.8 kg)  11/10/17 209 lb (94.8 kg)     Lab Results  Component Value Date    WBC 5.5 11/14/2017   HGB 13.7 11/14/2017   HCT 40.9 11/14/2017   PLT 425 11/14/2017   GLUCOSE 92 03/20/2018   CHOL 213 (H) 03/20/2018   TRIG 71.0 03/20/2018   HDL 72.20 03/20/2018   LDLDIRECT 164.5 07/24/2013   LDLCALC 126 (H) 03/20/2018   ALT 14 03/20/2018   AST 10 03/20/2018   NA 138 03/20/2018   K 3.7 03/20/2018   CL 101 03/20/2018   CREATININE 0.83 03/20/2018  BUN 11 03/20/2018   CO2 29 03/20/2018   TSH 1.97 03/20/2018   INR 1.0 04/21/2014    Mm Digital Screening Bilateral  Result Date: 11/23/2017 CLINICAL DATA:  Screening. EXAM: DIGITAL SCREENING BILATERAL MAMMOGRAM WITH CAD COMPARISON:  Previous exam(s). ACR Breast Density Category b: There are scattered areas of fibroglandular density. FINDINGS: There are no findings suspicious for malignancy. Images were processed with CAD. IMPRESSION: No mammographic evidence of malignancy. A result letter of this screening mammogram will be mailed directly to the patient. RECOMMENDATION: Screening mammogram in one year. (Code:SM-B-01Y) BI-RADS CATEGORY  1: Negative. Electronically Signed   By: Ammie Ferrier M.D.   On: 11/23/2017 10:55       Assessment & Plan:   Problem List Items Addressed This Visit    Abnormal chest CT    CT chest - revealed irregularity in esophagus.  Was referred to GI.  Note reviewed.  Given persistent symptoms and no improvement with current medication regimen, i.e., PPI, antihistamines and nasal sprays, she will be referred to pulmonary.  Also discuss with her regarding referral back to GI for f/u of abnormality noted on CT.        Anemia    Followed by hematology.        Cough - Primary    Persistent throat congestion and cough.  Not responding to PPI, antihistamines and nasal sprays.  Has seen ENT and GI.  Planning to refer to pulmonary for persistent cough and congestion.  Also feel f/u with GI is warranted given symptoms and irregularity noted on CT as outlined.        Relevant Orders    Ambulatory referral to Pulmonology   Essential hypertension, benign    Blood pressure under good control.  Continue same medication regimen.  Follow pressures.  Follow metabolic panel.        Relevant Medications   triamterene-hydrochlorothiazide (MAXZIDE-25) 37.5-25 MG tablet   felodipine (PLENDIL) 5 MG 24 hr tablet   atorvastatin (LIPITOR) 20 MG tablet   Other Relevant Orders   Basic metabolic panel   Lung nodule, solitary    Scheduled for f/u chest CT.        MGUS (monoclonal gammopathy of unknown significance)    Seeing hematology.  Stable.        Pure hypercholesterolemia    Low cholesterol diet and exercise.  Follow lipid panel and liver function tests.        Relevant Medications   triamterene-hydrochlorothiazide (MAXZIDE-25) 37.5-25 MG tablet   felodipine (PLENDIL) 5 MG 24 hr tablet   atorvastatin (LIPITOR) 20 MG tablet   Other Relevant Orders   Hepatic function panel   Lipid panel   Stress    Overall feels she is doing relatively well.  Does not feel needs anything more at this time.  Follow.           Einar Pheasant, MD

## 2018-03-22 NOTE — Telephone Encounter (Signed)
Please inform pt that we will get CXR instead of CT as insurance has denied CT scan. CXR is ordered.

## 2018-03-23 ENCOUNTER — Encounter: Payer: Self-pay | Admitting: Internal Medicine

## 2018-03-24 ENCOUNTER — Encounter: Payer: Self-pay | Admitting: Internal Medicine

## 2018-03-24 NOTE — Assessment & Plan Note (Signed)
CT chest - revealed irregularity in esophagus.  Was referred to GI.  Note reviewed.  Given persistent symptoms and no improvement with current medication regimen, i.e., PPI, antihistamines and nasal sprays, she will be referred to pulmonary.  Also discuss with her regarding referral back to GI for f/u of abnormality noted on CT.

## 2018-03-24 NOTE — Assessment & Plan Note (Signed)
Followed by hematology 

## 2018-03-24 NOTE — Assessment & Plan Note (Signed)
Low cholesterol diet and exercise.  Follow lipid panel and liver function tests.  

## 2018-03-24 NOTE — Assessment & Plan Note (Signed)
Seeing hematology.  Stable.   

## 2018-03-24 NOTE — Assessment & Plan Note (Signed)
Overall feels she is doing relatively well.  Does not feel needs anything more at this time.  Follow.

## 2018-03-24 NOTE — Assessment & Plan Note (Signed)
Scheduled for f/u chest CT.

## 2018-03-24 NOTE — Assessment & Plan Note (Signed)
Blood pressure under good control.  Continue same medication regimen.  Follow pressures.  Follow metabolic panel.   

## 2018-03-24 NOTE — Assessment & Plan Note (Signed)
Persistent throat congestion and cough.  Not responding to PPI, antihistamines and nasal sprays.  Has seen ENT and GI.  Planning to refer to pulmonary for persistent cough and congestion.  Also feel f/u with GI is warranted given symptoms and irregularity noted on CT as outlined.

## 2018-03-27 ENCOUNTER — Ambulatory Visit
Admission: RE | Admit: 2018-03-27 | Discharge: 2018-03-27 | Disposition: A | Discharge status: Home / Self Care | Payer: BC Managed Care – PPO | Source: Ambulatory Visit | Attending: Internal Medicine | Admitting: Internal Medicine

## 2018-03-27 ENCOUNTER — Ambulatory Visit: Payer: BC Managed Care – PPO

## 2018-03-27 DIAGNOSIS — R911 Solitary pulmonary nodule: Secondary | ICD-10-CM

## 2018-03-28 ENCOUNTER — Telehealth: Payer: Self-pay

## 2018-03-28 DIAGNOSIS — K219 Gastro-esophageal reflux disease without esophagitis: Secondary | ICD-10-CM

## 2018-03-28 DIAGNOSIS — R059 Cough, unspecified: Secondary | ICD-10-CM

## 2018-03-28 DIAGNOSIS — R05 Cough: Secondary | ICD-10-CM

## 2018-03-28 NOTE — Telephone Encounter (Signed)
-----   Message from Einar Pheasant, MD sent at 03/24/2018  5:40 PM EDT ----- Regarding: GI appt Please call pt and notify her that I still want her to see pulmonary as we discussed - for persistent cough and congestion.  Let her know that I would also like for her to f/u with GI - for question of need for EGD or other w/up.  Let me know either way if agreeable.    Dr Nicki Reaper

## 2018-03-28 NOTE — Telephone Encounter (Signed)
Order placed for GI referral.   

## 2018-03-28 NOTE — Telephone Encounter (Signed)
Patient stated she would continue to f/u with GI and see pulmonary as well.

## 2018-03-30 ENCOUNTER — Encounter: Payer: Self-pay | Admitting: Internal Medicine

## 2018-03-30 ENCOUNTER — Ambulatory Visit: Payer: BC Managed Care – PPO | Admitting: Internal Medicine

## 2018-03-30 VITALS — BP 118/78 | HR 94 | Resp 16 | Ht 66.0 in | Wt 217.0 lb

## 2018-03-30 DIAGNOSIS — K219 Gastro-esophageal reflux disease without esophagitis: Secondary | ICD-10-CM

## 2018-03-30 DIAGNOSIS — R05 Cough: Secondary | ICD-10-CM | POA: Diagnosis not present

## 2018-03-30 DIAGNOSIS — R059 Cough, unspecified: Secondary | ICD-10-CM

## 2018-03-30 DIAGNOSIS — R0981 Nasal congestion: Secondary | ICD-10-CM

## 2018-03-30 MED ORDER — CETIRIZINE HCL 10 MG PO TABS: 10.0000 mg | ORAL_TABLET | Freq: Every day | ORAL | 6 refills | Status: DC

## 2018-03-30 MED ORDER — IPRATROPIUM BROMIDE 0.06 % NA SOLN: 2.0000 | Freq: Four times a day (QID) | NASAL | 1 refills | Status: DC

## 2018-03-30 MED ORDER — PANTOPRAZOLE SODIUM 40 MG PO TBEC: 40.0000 mg | DELAYED_RELEASE_TABLET | Freq: Every day | ORAL | 1 refills | Status: DC

## 2018-03-30 NOTE — Progress Notes (Signed)
Name: Kathleen Gould MRN: [id number] DOB: April 16, 1966     CONSULTATION DATE: 03/30/2018 REFERRING MD : Nicki Reaper  CHIEF COMPLAINT: Cough and nasal congestion  STUDIES:     03/27/2018 CXR independently reviewed by Me No pneumonia no masses no effusion seen  03/17/2017 CT chest Independently reviewed by Me Small 2.5 mm nodule right lower lobe No pneumonia no masses seen  HISTORY OF PRESENT ILLNESS: 52 year old pleasant African-American female seen today for chronic cough Associated with nasal congestion with mucus production Patient states she has problems clearing her throat Symptoms happen mostly at night and when she wakes up Globs of mucus are produced when she coughs it up in the morning No signs of infection at this time  Patient has been 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 Marius Ditch with GI She has not had endoscopy at this point    PAST MEDICAL HISTORY :   has a past medical history of Arthritis, Endometriosis, Environmental allergies, GERD (gastroesophageal reflux disease), Hypercholesterolemia, Hypertension, and Uterine fibroid.  has a past surgical history that includes Oophorectomy; Cesarean section; Abdominal hysterectomy; and Thyroglossal duct cyst (N/A, 05/27/2015). Prior to Admission medications   Medication Sig Start Date End Date Taking? Authorizing Provider  atorvastatin (LIPITOR)  20 MG tablet Take 1 tablet (20 mg total) by mouth daily. 03/22/18  Yes Einar Pheasant, MD  felodipine (PLENDIL) 5 MG 24 hr tablet TAKE 1 TABLET (5 MG TOTAL) BY MOUTH DAILY. 03/22/18  Yes Einar Pheasant, MD  fluticasone (FLONASE) 50 MCG/ACT nasal spray Place 2 sprays into both nostrils daily. 03/22/18  Yes Einar Pheasant, MD  omeprazole (PRILOSEC) 40 MG capsule TAKE 1 CAPSULE BY MOUTH EVERY DAY 01/01/18  Yes Einar Pheasant, MD  triamterene-hydrochlorothiazide (MAXZIDE-25) 37.5-25 MG tablet TAKE 0.5 TABLETS BY MOUTH DAILY. 03/22/18  Yes Einar Pheasant, MD   Allergies  Allergen Reactions  . Cefdinir Itching  . Levofloxacin Other (See Comments)    Joint pain  . Simvastatin Other (See Comments)    "muscle aches"    FAMILY HISTORY:  family history includes Diabetes in her mother and unknown relative; Heart attack (age of onset: 8) in her father; Heart disease in her father; Hypercholesterolemia in her father; Hypertension in her father; Kidney disease in her father. SOCIAL HISTORY:  reports that she has never smoked. She has never used smokeless tobacco. She reports that she does not drink alcohol or use drugs.  REVIEW OF SYSTEMS:   Constitutional: Negative for fever, chills, weight loss, malaise/fatigue and diaphoresis.  HENT: + congestion, sore throat, neck pain, tinnitus and ear discharge.   Eyes: Negative for blurred vision, double vision, photophobia, pain, discharge and redness.  Respiratory: Negative for cough, hemoptysis, sputum production, shortness of breath, wheezing and stridor.   Cardiovascular: Negative for chest pain, palpitations, orthopnea, claudication, leg swelling and PND.  Gastrointestinal: Negative for heartburn, nausea, vomiting, abdominal pain, diarrhea, constipation, blood in stool and melena.  Genitourinary: Negative for dysuria, urgency, frequency, hematuria and flank pain.  Musculoskeletal: Negative for myalgias, back pain, joint pain and falls.  Skin: Negative for  itching and rash.  Neurological: Negative for dizziness, tingling, tremors, sensory change, speech change, focal weakness, seizures, loss of consciousness, weakness and headaches.  Endo/Heme/Allergies: Negative for environmental allergies and polydipsia. Does not bruise/bleed easily.  ALL OTHER ROS ARE NEGATIVE   BP 118/78 (BP Location: Left Arm, Cuff Size: Large)   Pulse 94   Resp 16   Ht 5\' 6"  (1.676 m)   Wt 217 lb (98.4 kg)   LMP 11/06/2000   SpO2 100%   BMI 35.02 kg/m    Physical Examination:   GENERAL:NAD, no fevers, chills, no weakness no fatigue HEAD: Normocephalic, atraumatic.  EYES: Pupils equal, round, reactive to light. Extraocular muscles intact. No scleral icterus.  MOUTH: Moist mucosal membrane.   EAR, NOSE, THROAT: Clear without exudates. No external lesions.  NECK: Supple. No thyromegaly. No nodules. No JVD.  PULMONARY:CTA B/L no wheezes, no crackles, no rhonchi CARDIOVASCULAR: S1 and S2. Regular rate and rhythm. No murmurs, rubs, or gallops. No edema.  GASTROINTESTINAL: Soft, nontender, nondistended. No masses. Positive bowel sounds.  MUSCULOSKELETAL: No swelling, clubbing, or edema. Range of motion full in all extremities.  NEUROLOGIC: Cranial nerves II through XII are intact. No gross focal neurological deficits.  SKIN: No ulceration, lesions, rashes, or cyanosis. Skin warm and dry. Turgor intact.  PSYCHIATRIC: Mood, affect within normal limits. The patient is awake, alert and oriented x 3. Insight, judgment intact.      ASSESSMENT / PLAN: 52 year old pleasant African-American female seen today for chronic cough in the setting of chronic nasal congestion most likely related to uncontrolled reflux in the setting of allergic rhinitis office spirometry today shows no evidence of obstructive or restrictive lung disease, the  findings shared with patient at this time I would recommend starting.  Protonix 40 mg daily and stopping omeprazole 20 mg daily I would  recommend starting Zyrtec 10 mg daily I have also added Atrovent nasal sprays to her regimen to see if this helps  Recommend follow-up in 1 month for interval changes   Patient satisfied with Plan of action and management. All questions answered  Corrin Parker, M.D.  [facility]  Medical Director [place] Director [facility] Cardio-Pulmonary Department

## 2018-03-30 NOTE — Patient Instructions (Signed)
Start Atrovent Nasal sprays Start Protonix 40 mg daily Start Zyrtec 10 Daily

## 2018-04-20 ENCOUNTER — Ambulatory Visit: Payer: BC Managed Care – PPO | Admitting: Gastroenterology

## 2018-04-20 ENCOUNTER — Encounter

## 2018-04-20 ENCOUNTER — Encounter: Payer: Self-pay | Admitting: Gastroenterology

## 2018-04-20 ENCOUNTER — Encounter: Payer: Self-pay | Admitting: Internal Medicine

## 2018-04-20 ENCOUNTER — Ambulatory Visit: Payer: BC Managed Care – PPO | Admitting: Internal Medicine

## 2018-04-20 VITALS — BP 127/84 | HR 81 | Resp 17 | Ht 66.0 in | Wt 216.6 lb

## 2018-04-20 VITALS — BP 140/92 | HR 87 | Ht 66.0 in | Wt 216.0 lb

## 2018-04-20 DIAGNOSIS — R070 Pain in throat: Secondary | ICD-10-CM | POA: Diagnosis not present

## 2018-04-20 DIAGNOSIS — Z1211 Encounter for screening for malignant neoplasm of colon: Secondary | ICD-10-CM | POA: Diagnosis not present

## 2018-04-20 DIAGNOSIS — R059 Cough, unspecified: Secondary | ICD-10-CM

## 2018-04-20 DIAGNOSIS — R05 Cough: Secondary | ICD-10-CM

## 2018-04-20 DIAGNOSIS — J309 Allergic rhinitis, unspecified: Secondary | ICD-10-CM | POA: Diagnosis not present

## 2018-04-20 NOTE — Progress Notes (Signed)
Name: BRITTAY MOGLE MRN: [id number] DOB: Sep 20, 1965     CONSULTATION DATE: 03/30/2018 REFERRING MD : Nicki Reaper  CHIEF COMPLAINT: Cough and nasal congestion  STUDIES:     03/27/2018 CXR independently reviewed by Me No pneumonia no masses no effusion seen  03/17/2017 CT chest Independently reviewed by Me Small 2.5 mm nodule right lower lobe No pneumonia no masses seen  HISTORY OF PRESENT ILLNESS: Previous OV 52 year old pleasant African-American female seen today for chronic cough Associated with nasal congestion with mucus production Patient states she has problems clearing her throat Symptoms happen mostly at night and when she wakes up Globs of mucus are produced when she coughs it up in the morning No signs of infection at this time  I have started Zyrtec and Atrovent nasal sprays and added Protonix for GERD And she feels much better since therapy started She states that Zyrtec is the main drug that she thinks helped  Patient denies any shortness of breath no palpitations no chest pain no fevers no chills no nausea vomiting no diarrhea no constipation no wheezing   Patient is a non-smoker nonalcoholic no secondhand smoke exposure  No active signs of infection No active signs of heart failure     REVIEW OF SYSTEMS:   Constitutional: Negative for fever, chills, weight loss, malaise/fatigue and diaphoresis.  HENT:-congestion, -sore throat, -neck pain, tinnitus and ear discharge.   Eyes: Negative for blurred vision, double vision, photophobia, pain, discharge and redness.  Respiratory: Negative for cough, hemoptysis, sputum production, shortness of breath, wheezing and stridor.   Cardiovascular: Negative for chest pain, palpitations, orthopnea, claudication, leg swelling and PND.  Gastrointestinal: Negative for heartburn, nausea, vomiting, abdominal pain, diarrhea, constipation, blood in stool and melena.  Genitourinary: Negative for dysuria, urgency, frequency,  hematuria and flank pain.  Musculoskeletal: Negative for myalgias, back pain, joint pain and falls.  Skin: Negative for itching and rash.  Neurological: Negative for dizziness, tingling, tremors, sensory change, speech change, focal weakness, seizures, loss of consciousness, weakness and headaches.  Endo/Heme/Allergies: Negative for environmental allergies and polydipsia. Does not bruise/bleed easily.  ALL OTHER ROS ARE NEGATIVE   Ht 5\' 6"  (1.676 m)   Wt 216 lb (98 kg)   LMP 11/06/2000   BMI 34.86 kg/m   BP (!) 140/92 (BP Location: Left Arm, Cuff Size: Normal)   Pulse 87   Ht 5\' 6"  (1.676 m)   Wt 216 lb (98 kg)   LMP 11/06/2000   SpO2 97%   BMI 34.86 kg/m   Physical Examination:   GENERAL:NAD, no fevers, chills, no weakness no fatigue HEAD: Normocephalic, atraumatic.  EYES: Pupils equal, round, reactive to light. Extraocular muscles intact. No scleral icterus.  MOUTH: Moist mucosal membrane.   EAR, NOSE, THROAT: Clear without exudates. No external lesions.  NECK: Supple. No thyromegaly. No nodules. No JVD.  PULMONARY:CTA B/L no wheezes, no crackles, no rhonchi CARDIOVASCULAR: S1 and S2. Regular rate and rhythm. No murmurs, rubs, or gallops. No edema.  GASTROINTESTINAL: Soft, nontender, nondistended. No masses. Positive bowel sounds.  MUSCULOSKELETAL: No swelling, clubbing, or edema. Range of motion full in all extremities.  NEUROLOGIC: Cranial nerves II through XII are intact. No gross focal neurological deficits.  SKIN: No ulceration, lesions, rashes, or cyanosis. Skin warm and dry. Turgor intact.  PSYCHIATRIC: Mood, affect within normal limits. The patient is awake, alert and oriented x 3. Insight, judgment intact.      ASSESSMENT / PLAN: 52 year old pleasant African-American female seen today for follow up chronic cough  in the setting of chronic nasal congestion most likely related to uncontrolled reflux in the setting of allergic rhinitis    Continue Protonix 40 mg  daily  And use as directed per GI Continue Zyrtec 10 mg daily  Atrovent nasal sprays as needed  Recommend follow-up in 6 months for interval changes   Patient satisfied with Plan of action and management. All questions answered  Corrin Parker, M.D.  [facility]  Medical Director [place] Director [facility] Cardio-Pulmonary Department

## 2018-04-20 NOTE — Progress Notes (Signed)
Cephas Darby, MD [address]  [place]  [place], [place] [address]  Main: [phone]  Fax: [phone]   Gastroenterology Consultation  Referring Provider:     Einar Pheasant, MD Primary Care Physician:  Einar Pheasant, MD Primary Gastroenterologist:  Dr. Sherri Sear Reason for Consultation:  Throat discomfort        HPI:   Kathleen Gould is a 52 y.o. y/o female referred for consultation & management  by Dr. Einar Pheasant, MD. Malori reports about 2-3 years history of intermittent heartburn and has been on over-the-counter omeprazole once daily which provided some relief. But in last few months she has been suffering from constant drainage in her throat and soreness in her throat. Although she denies any difficulty swallowing both to solids and liquids, she does feel there is something in her throat that interferes with swallowing.She thought that throat drainage was secondary to sinus infection and she received antibiotics, fluticasone nasal spray but with no relief. She started taking Zegerid early in the morning one pill a day. She does not smoke or drink alcohol. She denies any other upper GI symptoms. She denies fever, chills, nausea, vomiting, loss of appetite, weight loss. She lost her husband in November from cardiac arrest. She had a colonoscopy at age 88 and denies any history of polyps. She never had an upper endoscopy in the past and we do not know her H. pylori status. She denies taking NSAIDs. Or over-the-counter herbal supplements.   Follow-up visit 07/11/2017: She continues to have above symptoms and they have unchanged despite being on omeprazole 40 mg twice daily.  She is also following antireflux measures.  She reports feeling short of breath only when talking and she was recently seen by cardiologist and workup was negative.  She was also seen by ENT and laryngoscopy was reportedly normal.  When I asked her about how she is coping up with her recent  loss of husband and she became tearful.  She thinks she is depressed as she lives by herself.  Apparently, Dr. Nicki Reaper has started her on Zoloft and she has not started taking it yet.  Follow-up visit 04/20/2018 She was seen by pulmonologist who switched from Prilosec to Protonix 40 mg once daily and started zyrtec. She reports that her cough and throat discomfort has significantly improved on zyrtec. She is overdue for her colonoscopy She reports that her depression is fairly under controlled. She is looking forward to this school year starting Monday next week   Past Medical History:  Diagnosis Date  . Arthritis   . Endometriosis   . Environmental allergies   . GERD (gastroesophageal reflux disease)   . Hypercholesterolemia   . Hypertension   . Uterine fibroid     Past Surgical History:  Procedure Laterality Date  . ABDOMINAL HYSTERECTOMY    . CESAREAN SECTION     x2  . OOPHORECTOMY     S/P left secondary to cyst  . THYROGLOSSAL DUCT CYST N/A 05/27/2015   Procedure: THYROGLOSSAL DUCT CYST EXCISION ;  Surgeon: Clyde Canterbury, MD;  Location: [facility] ORS;  Service: ENT;  Laterality: N/A;    Current Outpatient Medications:  .  atorvastatin (LIPITOR) 20 MG tablet, Take 1 tablet (20 mg total) by mouth daily., Disp: 30 tablet, Rfl: 5 .  cetirizine (ZYRTEC ALLERGY) 10 MG tablet, Take 1 tablet (10 mg total) by mouth daily., Disp: 30 tablet, Rfl: 6 .  felodipine (PLENDIL) 5 MG 24 hr tablet, TAKE 1 TABLET (  5 MG TOTAL) BY MOUTH DAILY., Disp: 90 tablet, Rfl: 1 .  pantoprazole (PROTONIX) 40 MG tablet, Take 1 tablet (40 mg total) by mouth daily., Disp: 30 tablet, Rfl: 1 .  triamterene-hydrochlorothiazide (MAXZIDE-25) 37.5-25 MG tablet, TAKE 0.5 TABLETS BY MOUTH DAILY., Disp: 30 tablet, Rfl: 5 .  fluticasone (FLONASE) 50 MCG/ACT nasal spray, Place 2 sprays into both nostrils daily. (Patient not taking: Reported on 04/20/2018), Disp: 16 g, Rfl: 5 .  ipratropium (ATROVENT) 0.06 % nasal spray, Place 2  sprays into the nose 4 (four) times daily. (Patient not taking: Reported on 04/20/2018), Disp: 15 mL, Rfl: 1 .  omeprazole (PRILOSEC) 40 MG capsule, TAKE 1 CAPSULE BY MOUTH EVERY DAY (Patient not taking: Reported on 04/20/2018), Disp: 90 capsule, Rfl: 1   Family History  Problem Relation Age of Onset  . Heart attack Father 4  . Heart disease Father        bypass surgery  . Hypertension Father   . Hypercholesterolemia Father   . Kidney disease Father   . Diabetes Mother   . Diabetes Unknown        aunt  . Breast cancer Neg Hx      Social History   Tobacco Use  . Smoking status: Never Smoker  . Smokeless tobacco: Never Used  Substance Use Topics  . Alcohol use: No    Alcohol/week: 0.0 standard drinks  . Drug use: No    Allergies as of 04/20/2018 - Review Complete 04/20/2018  Allergen Reaction Noted  . Cefdinir Itching 02/27/2015  . Levofloxacin Other (See Comments) 11/12/2016  . Simvastatin Other (See Comments) 11/05/2012    Review of Systems:    All systems reviewed and negative except where noted in HPI.   Physical Exam:  BP 127/84 (BP Location: Left Arm, Patient Position: Sitting, Cuff Size: Large)   Pulse 81   Resp 17   Ht 5\' 6"  (1.676 m)   Wt 216 lb 9.6 oz (98.2 kg)   LMP 11/06/2000   BMI 34.96 kg/m  Patient's last menstrual period was 11/06/2000. Psych:  Alert and cooperative. Normal mood and affect. General:   Alert,  Well-developed, well-nourished, pleasant and cooperative in NAD Head:  Normocephalic and atraumatic. Eyes:  Sclera clear, no icterus.   Conjunctiva pink. Ears:  Normal auditory acuity. Nose:  No deformity, discharge, or lesions. Mouth:  No deformity or lesions,oropharynx pink & moist. Neck:  Supple; no masses or thyromegaly. Lungs:  Respirations even and unlabored.  Clear throughout to auscultation.   No wheezes, crackles, or rhonchi. No acute distress. Heart:  Regular rate and rhythm; no murmurs, clicks, rubs, or gallops. Abdomen:  Normal  bowel sounds.  No bruits.  Soft, non-tender and non-distended without masses, hepatosplenomegaly or hernias noted.  No guarding or rebound tenderness.    Msk:  Symmetrical without gross deformities. Good, equal movement & strength bilaterally. Pulses:  Normal pulses noted. Extremities:  No clubbing or edema.  No cyanosis. Neurologic:  Alert and oriented x3;  grossly normal neurologically. Skin:  Intact without significant lesions or rashes. No jaundice. Lymph Nodes:  No significant cervical adenopathy. Psych:  Alert and cooperative. Normal mood and affect.  Imaging Studies: Reviewed CT soft tissue neck, chest from June 2018 and the questionable soft tissue mass in the neck was thought to be secondary to atherosclerotic calcification in the carotid artery.  Assessment and Plan:   AKERA SNOWBERGER is a 52 y.o. female is here for follow-up of chronic intermittent heartburn and constant drainage  in her throat.  Cardiac and ENT workup is negative. Her symptoms have significantly improved on Protonix 40 mg daily and Zrytec for allergies. Do not recommend EGD at this time. However, she is due for colon cancer screening. She would like to check with her insurance company first about the colonoscopy for scheduling the procedure.   Follow up as needed  Dr. Sherri Sear

## 2018-04-20 NOTE — Patient Instructions (Signed)
Continue Zyrtec as prescribed atrovent nasal sprays as needed

## 2018-05-07 ENCOUNTER — Ambulatory Visit: Payer: BC Managed Care – PPO | Admitting: Internal Medicine

## 2018-05-29 ENCOUNTER — Other Ambulatory Visit: Payer: Self-pay | Admitting: Internal Medicine

## 2018-05-29 DIAGNOSIS — K219 Gastro-esophageal reflux disease without esophagitis: Secondary | ICD-10-CM

## 2018-07-07 ENCOUNTER — Other Ambulatory Visit: Payer: Self-pay | Admitting: Internal Medicine

## 2018-07-07 DIAGNOSIS — K219 Gastro-esophageal reflux disease without esophagitis: Secondary | ICD-10-CM

## 2018-07-16 ENCOUNTER — Other Ambulatory Visit (INDEPENDENT_AMBULATORY_CARE_PROVIDER_SITE_OTHER): Payer: BC Managed Care – PPO

## 2018-07-16 DIAGNOSIS — E7800 Pure hypercholesterolemia, unspecified: Secondary | ICD-10-CM

## 2018-07-16 DIAGNOSIS — I10 Essential (primary) hypertension: Secondary | ICD-10-CM

## 2018-07-16 LAB — BASIC METABOLIC PANEL
BUN: 11 mg/dL (ref 6–23)
CHLORIDE: 105 meq/L (ref 96–112)
CO2: 30 meq/L (ref 19–32)
Calcium: 10 mg/dL (ref 8.4–10.5)
Creatinine, Ser: 0.77 mg/dL (ref 0.40–1.20)
GFR: 100.41 mL/min (ref >60.00)
Glucose, Bld: 98 mg/dL (ref 70–99)
Potassium: 3.6 mEq/L (ref 3.5–5.1)
SODIUM: 142 meq/L (ref 135–145)

## 2018-07-16 LAB — LIPID PANEL
Cholesterol: 216 mg/dL — ABNORMAL HIGH (ref 0–200)
HDL: 75.2 mg/dL (ref >39.00)
LDL CALC: 129 mg/dL — AB (ref 0–99)
NONHDL: 141.11
Total CHOL/HDL Ratio: 3
Triglycerides: 63 mg/dL (ref 0.0–149.0)
VLDL: 12.6 mg/dL (ref 0.0–40.0)

## 2018-07-16 LAB — HEPATIC FUNCTION PANEL
ALK PHOS: 64 U/L (ref 39–117)
ALT: 15 U/L (ref 0–35)
AST: 15 U/L (ref 0–37)
Albumin: 4.3 g/dL (ref 3.5–5.2)
BILIRUBIN DIRECT: 0.1 mg/dL (ref 0.0–0.3)
TOTAL PROTEIN: 7.6 g/dL (ref 6.0–8.3)
Total Bilirubin: 0.7 mg/dL (ref 0.2–1.2)

## 2018-07-17 ENCOUNTER — Encounter: Payer: Self-pay | Admitting: Internal Medicine

## 2018-07-18 IMAGING — DX DG KNEE COMPLETE 4+V*R*
5 series · 5 of 5 positions shown · non-contrast
Comparison: No recent prior.

CLINICAL DATA: Right knee pain.

EXAM:
RIGHT KNEE - COMPLETE 4+ VIEW

[knee standing ap]
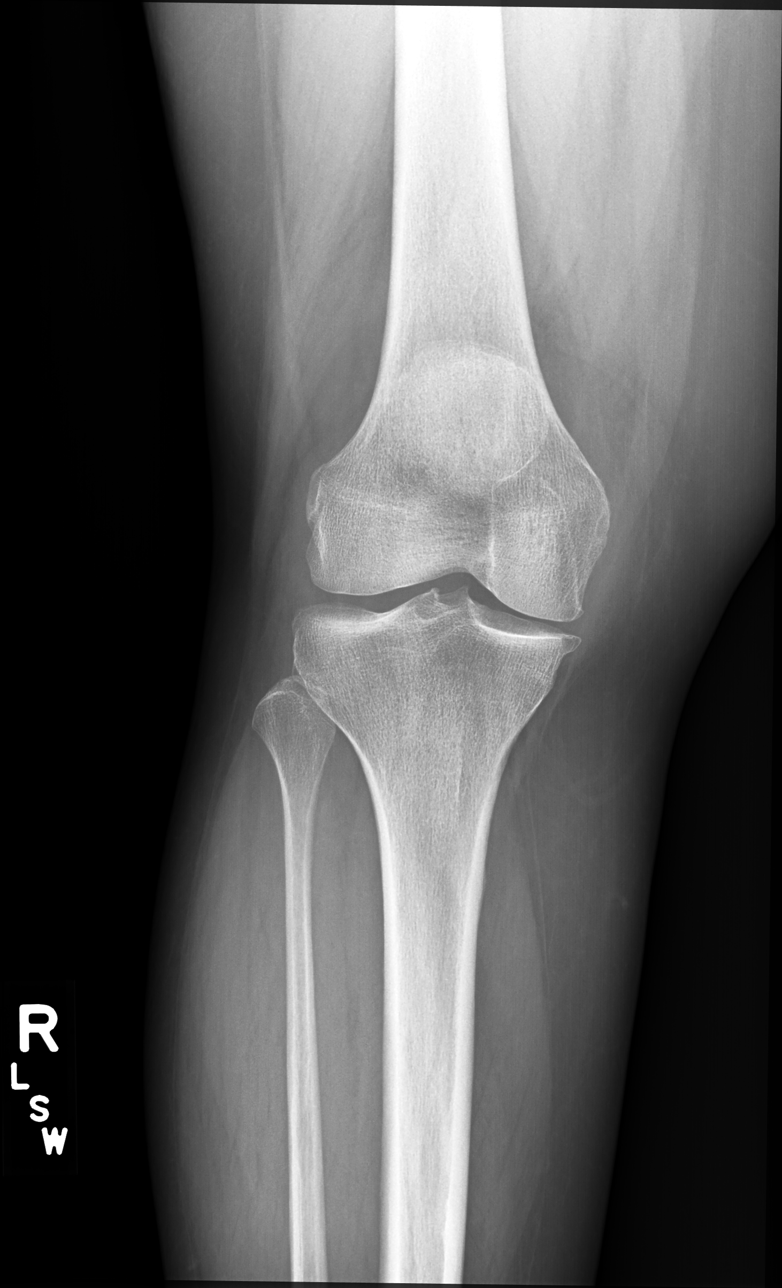

[knee standing external ap]
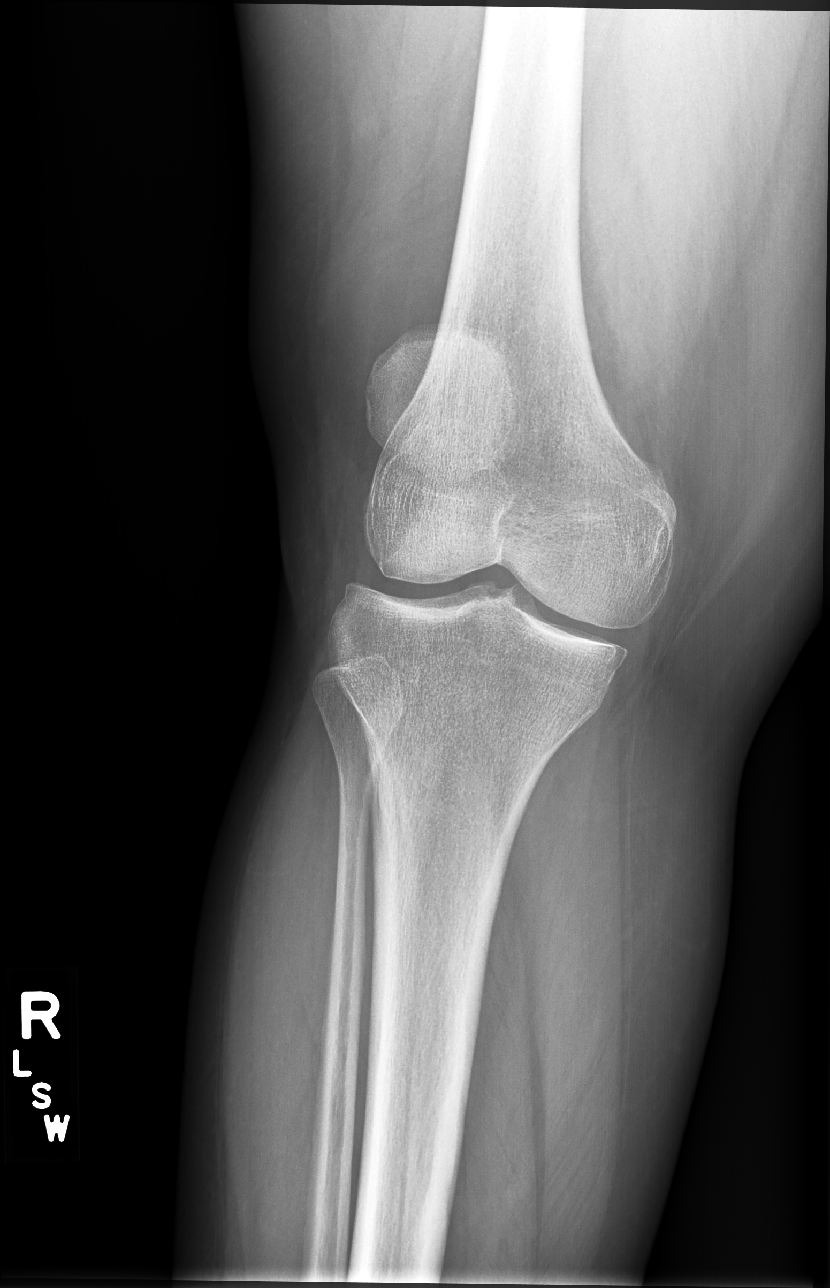

[knee standing internal ap]
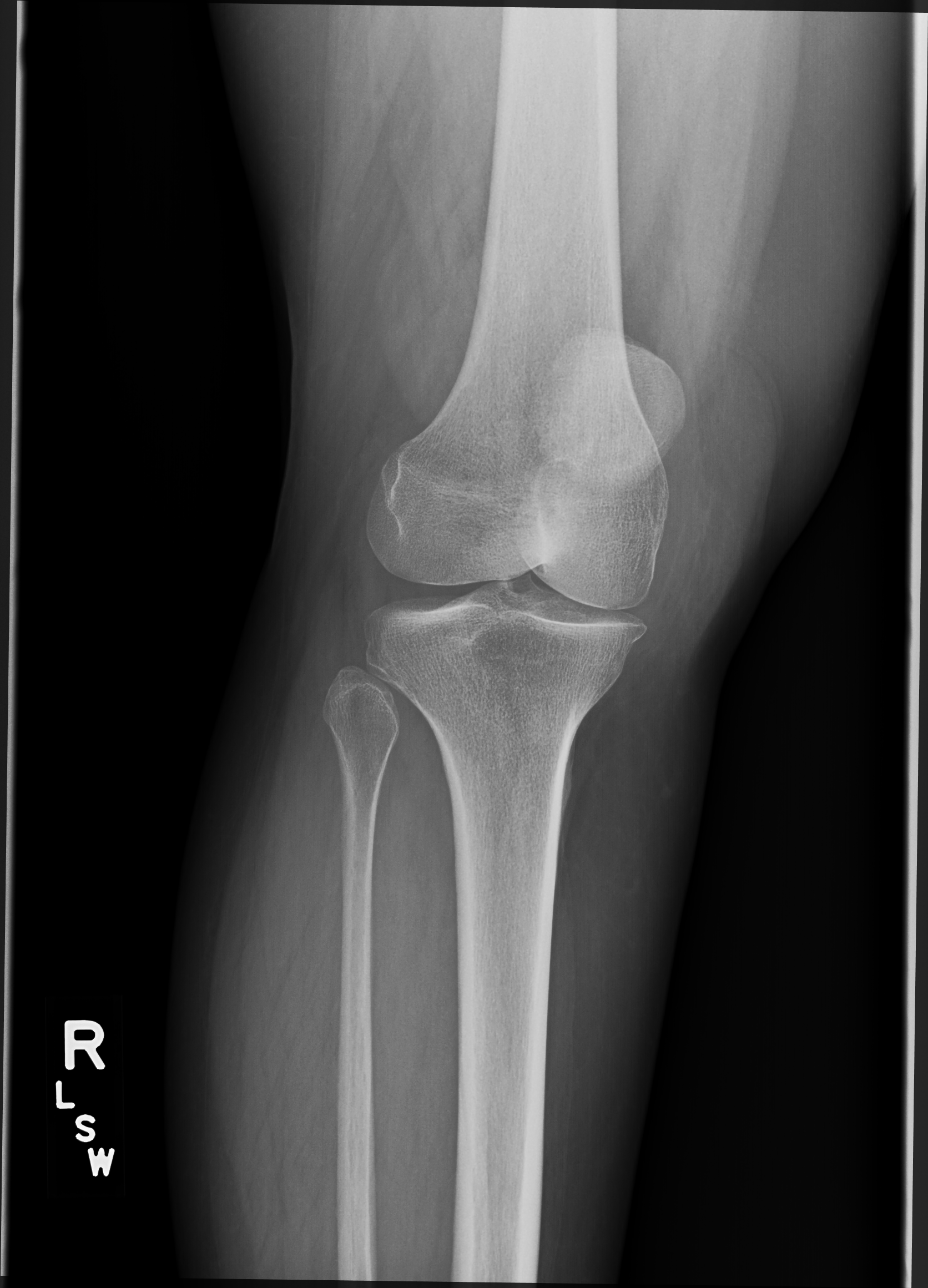

[knee standing lat]
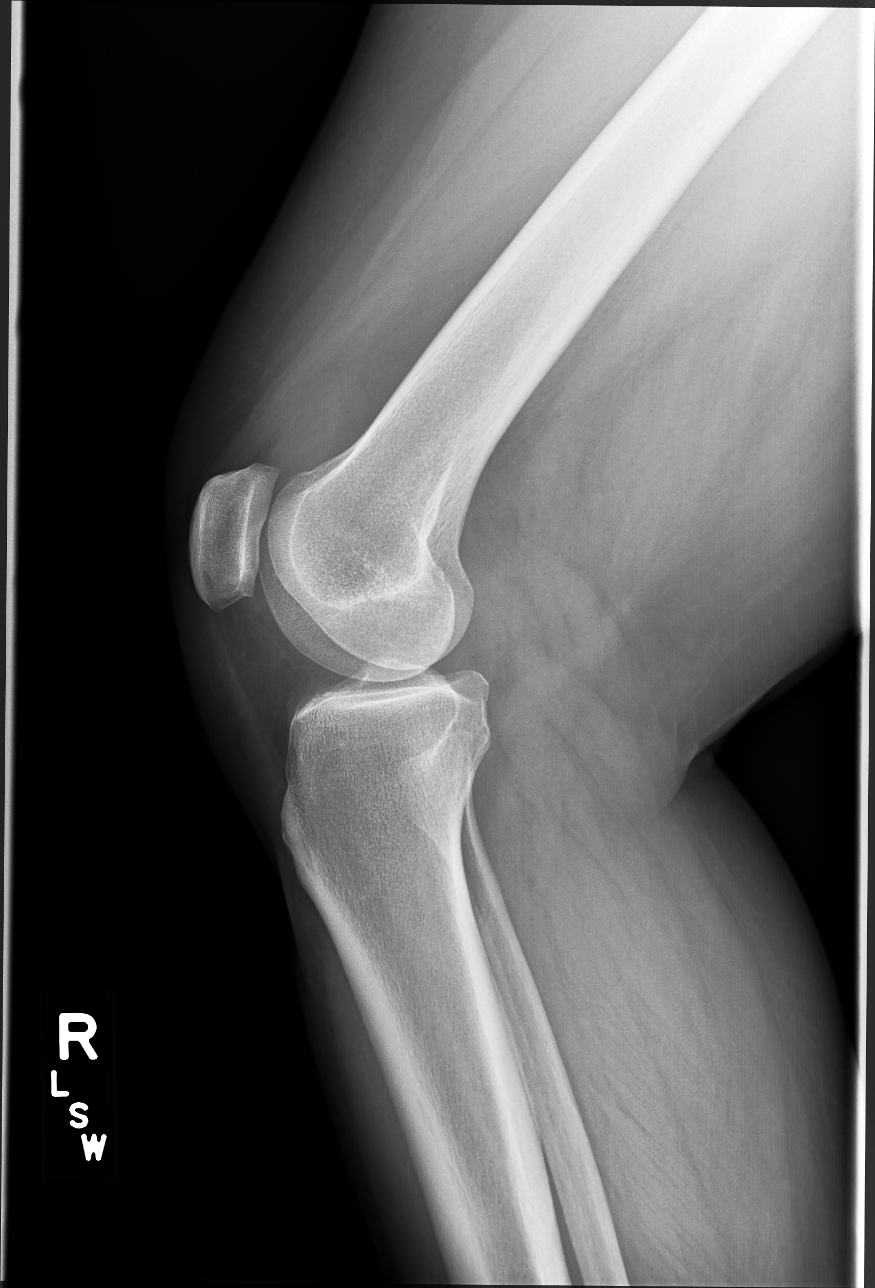

[knee [person_name] view pa]
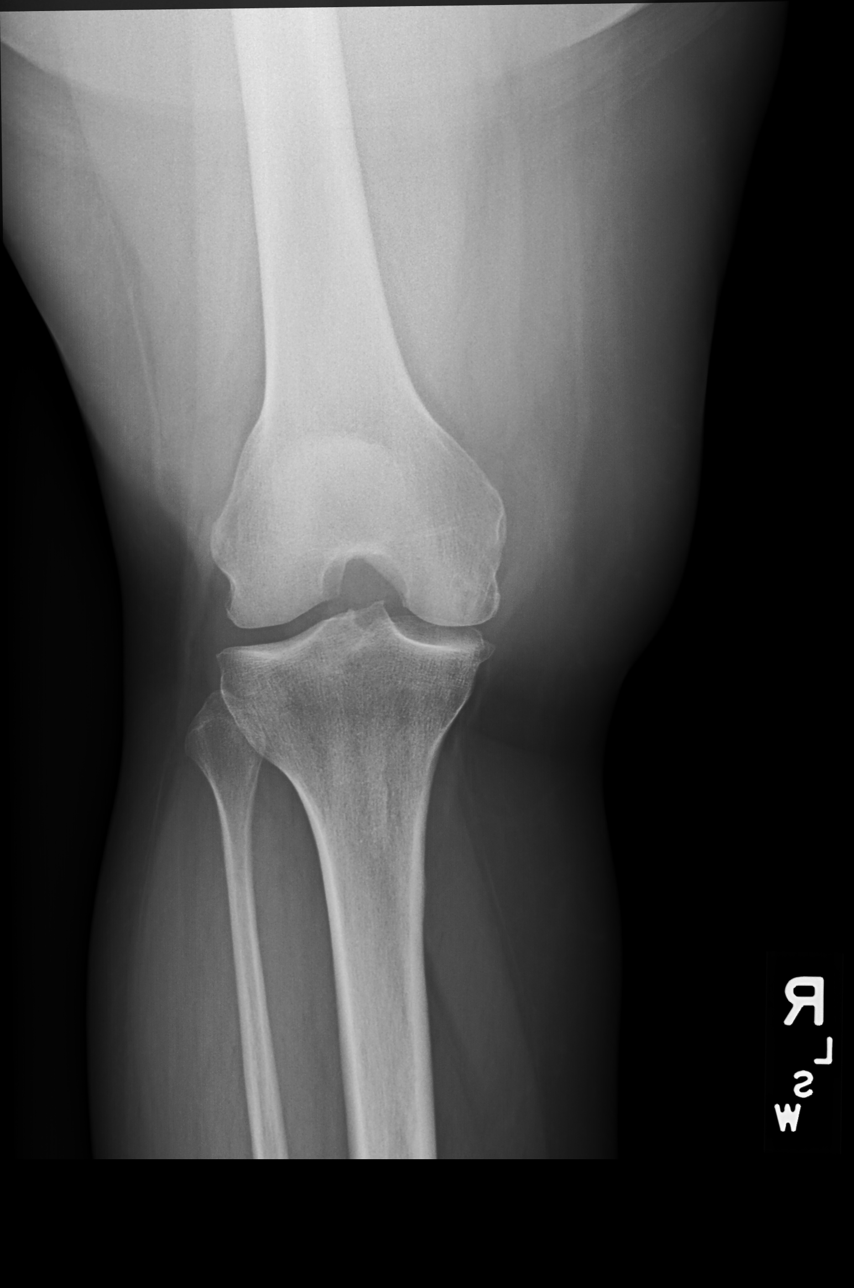

[5 of 5 positions shown; findings below may reference images not displayed]

FINDINGS: Small knee joint effusion. No evidence of fracture or dislocation.
No acute bony abnormality identified .
IMPRESSION: 1. Small knee joint effusion.

2. No acute bony abnormality identified.

## 2018-07-23 ENCOUNTER — Other Ambulatory Visit: Payer: Self-pay | Admitting: Internal Medicine

## 2018-07-23 DIAGNOSIS — K219 Gastro-esophageal reflux disease without esophagitis: Secondary | ICD-10-CM

## 2018-07-25 ENCOUNTER — Encounter: Payer: Self-pay | Admitting: Internal Medicine

## 2018-07-25 ENCOUNTER — Ambulatory Visit: Payer: BC Managed Care – PPO | Admitting: Internal Medicine

## 2018-07-25 DIAGNOSIS — F439 Reaction to severe stress, unspecified: Secondary | ICD-10-CM

## 2018-07-25 DIAGNOSIS — D649 Anemia, unspecified: Secondary | ICD-10-CM

## 2018-07-25 DIAGNOSIS — K219 Gastro-esophageal reflux disease without esophagitis: Secondary | ICD-10-CM

## 2018-07-25 DIAGNOSIS — E7800 Pure hypercholesterolemia, unspecified: Secondary | ICD-10-CM

## 2018-07-25 DIAGNOSIS — D472 Monoclonal gammopathy: Secondary | ICD-10-CM

## 2018-07-25 DIAGNOSIS — R911 Solitary pulmonary nodule: Secondary | ICD-10-CM

## 2018-07-25 DIAGNOSIS — Z9109 Other allergy status, other than to drugs and biological substances: Secondary | ICD-10-CM | POA: Diagnosis not present

## 2018-07-25 DIAGNOSIS — I10 Essential (primary) hypertension: Secondary | ICD-10-CM

## 2018-07-25 DIAGNOSIS — R9389 Abnormal findings on diagnostic imaging of other specified body structures: Secondary | ICD-10-CM

## 2018-07-25 NOTE — Progress Notes (Signed)
Patient ID: Kathleen Gould, female   DOB: Jan 16, 1966, 52 y.o.   MRN: [id number]   Subjective:    Patient ID: Kathleen Gould, female    DOB: 1966/06/07, 52 y.o.   MRN: [id number]  HPI  Patient here for a scheduled follow up.  She reports she is doing relatively well.  Tries to stay active.  Recent toe fracture. Saw podiatry.  Seeing pulmonary now.  On zyrtec.  Was using atrovent nasal srpay.  protonix controls acid reflux. Noticed yesterday, some increased congestion - nasal congestion and drainage.  No sinus pressure.  No chest congestion.  No sob.  No wheezing.  No chest pain.  No acid reflux reported.  Feels protonix works well.  No abdominal pain.  Bowels moving.  Handling stress.     Past Medical History:  Diagnosis Date  . Arthritis   . Endometriosis   . Environmental allergies   . GERD (gastroesophageal reflux disease)   . Hypercholesterolemia   . Hypertension   . Uterine fibroid    Past Surgical History:  Procedure Laterality Date  . ABDOMINAL HYSTERECTOMY    . CESAREAN SECTION     x2  . OOPHORECTOMY     S/P left secondary to cyst  . THYROGLOSSAL DUCT CYST N/A 05/27/2015   Procedure: THYROGLOSSAL DUCT CYST EXCISION ;  Surgeon: Clyde Canterbury, MD;  Location: [facility] ORS;  Service: ENT;  Laterality: N/A;   Family History  Problem Relation Age of Onset  . Heart attack Father 55  . Heart disease Father        bypass surgery  . Hypertension Father   . Hypercholesterolemia Father   . Kidney disease Father   . Diabetes Mother   . Diabetes Unknown        aunt  . Breast cancer Neg Hx    Social History   Socioeconomic History  . Marital status: Married    Spouse name: Not on file  . Number of children: 2  . Years of education: Not on file  . Highest education level: Not on file  Occupational History  . Not on file  Social Needs  . Financial resource strain: Not on file  . Food insecurity:    Worry: Not on file    Inability: Not on file  . Transportation needs:     Medical: Not on file    Non-medical: Not on file  Tobacco Use  . Smoking status: Never Smoker  . Smokeless tobacco: Never Used  Substance and Sexual Activity  . Alcohol use: No    Alcohol/week: 0.0 standard drinks  . Drug use: No  . Sexual activity: Not on file  Lifestyle  . Physical activity:    Days per week: Not on file    Minutes per session: Not on file  . Stress: Not on file  Relationships  . Social connections:    Talks on phone: Not on file    Gets together: Not on file    Attends religious service: Not on file    Active member of club or organization: Not on file    Attends meetings of clubs or organizations: Not on file    Relationship status: Not on file  Other Topics Concern  . Not on file  Social History Narrative  . Not on file    Outpatient Encounter Medications as of 07/25/2018  Medication Sig  . atorvastatin (LIPITOR) 20 MG tablet Take 1 tablet (20 mg total) by mouth daily.  . cetirizine (ZYRTEC  ALLERGY) 10 MG tablet Take 1 tablet (10 mg total) by mouth daily.  . felodipine (PLENDIL) 5 MG 24 hr tablet TAKE 1 TABLET (5 MG TOTAL) BY MOUTH DAILY.  . fluticasone (FLONASE) 50 MCG/ACT nasal spray Place 2 sprays into both nostrils daily.  . pantoprazole (PROTONIX) 40 MG tablet TAKE 1 TABLET BY MOUTH EVERY DAY  . triamterene-hydrochlorothiazide (MAXZIDE-25) 37.5-25 MG tablet TAKE 0.5 TABLETS BY MOUTH DAILY.  . [DISCONTINUED] ipratropium (ATROVENT) 0.06 % nasal spray Place 2 sprays into the nose 4 (four) times daily.  . [DISCONTINUED] omeprazole (PRILOSEC) 40 MG capsule TAKE 1 CAPSULE BY MOUTH EVERY DAY   No facility-administered encounter medications on file as of 07/25/2018.     Review of Systems  Constitutional: Negative for appetite change and unexpected weight change.  HENT: Positive for congestion and postnasal drip. Negative for sinus pressure.   Respiratory: Negative for cough, chest tightness and shortness of breath.   Cardiovascular: Negative for  chest pain, palpitations and leg swelling.  Gastrointestinal: Negative for abdominal pain, nausea and vomiting.  Genitourinary: Negative for difficulty urinating and dysuria.  Musculoskeletal: Negative for joint swelling and myalgias.  Skin: Negative for color change and rash.  Neurological: Negative for dizziness, light-headedness and headaches.  Psychiatric/Behavioral: Negative for agitation and dysphoric mood.       Objective:    Physical Exam  Constitutional: She appears well-developed and well-nourished. No distress.  HENT:  Nose: Nose normal.  Mouth/Throat: Oropharynx is clear and moist.  Neck: Neck supple. No thyromegaly present.  Cardiovascular: Normal rate and regular rhythm.  Pulmonary/Chest: Breath sounds normal. No respiratory distress. She has no wheezes.  Abdominal: Soft. Bowel sounds are normal. There is no tenderness.  Musculoskeletal: She exhibits no edema or tenderness.  Lymphadenopathy:    She has no cervical adenopathy.  Skin: No rash noted. No erythema.  Psychiatric: She has a normal mood and affect. Her behavior is normal.    BP 132/78 (BP Location: Left Arm, Patient Position: Sitting, Cuff Size: Large)   Pulse 89   Temp (!) 97.4 F (36.3 C) (Oral)   Resp 18   Wt 220 lb 3.2 oz (99.9 kg)   LMP 11/06/2000   SpO2 99%   BMI 35.54 kg/m  Wt Readings from Last 3 Encounters:  07/25/18 220 lb 3.2 oz (99.9 kg)  04/20/18 216 lb (98 kg)  04/20/18 216 lb 9.6 oz (98.2 kg)     Lab Results  Component Value Date   WBC 5.5 11/14/2017   HGB 13.7 11/14/2017   HCT 40.9 11/14/2017   PLT 425 11/14/2017   GLUCOSE 98 07/16/2018   CHOL 216 (H) 07/16/2018   TRIG 63.0 07/16/2018   HDL 75.20 07/16/2018   LDLDIRECT 164.5 07/24/2013   LDLCALC 129 (H) 07/16/2018   ALT 15 07/16/2018   AST 15 07/16/2018   NA 142 07/16/2018   K 3.6 07/16/2018   CL 105 07/16/2018   CREATININE 0.77 07/16/2018   BUN 11 07/16/2018   CO2 30 07/16/2018   TSH 1.97 03/20/2018   INR 1.0  04/21/2014    Dg Chest 2 View  Result Date: 03/27/2018 CLINICAL DATA:  52 year old female with 4 millimeter right middle lobe lung nodule discovered on chest CT without contrast in June 2018. Subsequent encounter. EXAM: CHEST - 2 VIEW COMPARISON:  Chest CT 02/15/2017. Chest radiograph 02/08/2017 and earlier. FINDINGS: The small right middle lobe lung was occult nodule seen by CT radiographically. And today the lungs appear stable in clear. Lung volumes and  mediastinal contours remain normal. Visualized tracheal air column is within normal limits. No pneumothorax or pleural effusion. No acute osseous abnormality identified. Negative visible bowel gas pattern. IMPRESSION: 1. The right middle lobe lung nodule seen by CT in June 2018 remains radiographically occult. Chest CT would best evaluate stability or resolution of the nodule as necessary. 2. Stable and negative radiographic appearance of the chest. Electronically Signed   By: Genevie Ann M.D.   On: 03/27/2018 09:37       Assessment & Plan:   Problem List Items Addressed This Visit    Abnormal chest CT    CT chest revealed irregularity in esophagus.  Was referred to GI.  Note reviewed.  Symptoms controlled on protonix.  Follow.        Anemia    Has been followed by hematology.        Environmental allergies    With some increased nasal congestion and drainage as outlined.  Zyrtec. Restart atrovent nasal spray.  Taking robitussin.  Continue. Follow.  Notify if symptoms worsen.        Esophageal reflux    Doing well on protonix.  Follow.       Essential hypertension, benign    Blood pressure under good control.  Continue same medication regimen.  Follow pressures.  Follow metabolic panel.        Relevant Orders   Basic metabolic panel   Lung nodule, solitary    Seeing hematology.  CT chest denied by insurance.  Had cxr as outlined.  Hematology following.        MGUS (monoclonal gammopathy of unknown significance)    Followed by  hematology.  Note reviewed.  Last check slightly increased.  No further intervention.  Recommended continued f/u.        Pure hypercholesterolemia    Low cholesterol diet and exercise.  Follow lipid panel and liver function tests.        Relevant Orders   Hepatic function panel   Lipid panel   Stress    Discussed with her today.  Overall feels she is handling things relatively well.  Follow.            Einar Pheasant, MD

## 2018-07-28 ENCOUNTER — Encounter: Payer: Self-pay | Admitting: Internal Medicine

## 2018-07-28 NOTE — Assessment & Plan Note (Signed)
Seeing hematology.  CT chest denied by insurance.  Had cxr as outlined.  Hematology following.

## 2018-07-28 NOTE — Assessment & Plan Note (Signed)
Has been followed by hematology.  °

## 2018-07-28 NOTE — Assessment & Plan Note (Signed)
Blood pressure under good control.  Continue same medication regimen.  Follow pressures.  Follow metabolic panel.   

## 2018-07-28 NOTE — Assessment & Plan Note (Signed)
Followed by hematology.  Note reviewed.  Last check slightly increased.  No further intervention.  Recommended continued f/u.

## 2018-07-28 NOTE — Assessment & Plan Note (Signed)
Discussed with her today.  Overall feels she is handling things relatively well.  Follow.

## 2018-07-28 NOTE — Assessment & Plan Note (Signed)
Doing well on protonix.  Follow.   

## 2018-07-28 NOTE — Assessment & Plan Note (Signed)
With some increased nasal congestion and drainage as outlined.  Zyrtec. Restart atrovent nasal spray.  Taking robitussin.  Continue. Follow.  Notify if symptoms worsen.

## 2018-07-28 NOTE — Assessment & Plan Note (Signed)
CT chest revealed irregularity in esophagus.  Was referred to GI.  Note reviewed.  Symptoms controlled on protonix.  Follow.

## 2018-07-28 NOTE — Assessment & Plan Note (Signed)
Low cholesterol diet and exercise.  Follow lipid panel and liver function tests.  

## 2018-08-22 ENCOUNTER — Other Ambulatory Visit: Payer: Self-pay | Admitting: Internal Medicine

## 2018-08-22 DIAGNOSIS — K219 Gastro-esophageal reflux disease without esophagitis: Secondary | ICD-10-CM

## 2018-09-12 IMAGING — DX DG CHEST 2V
2 series · 2 of 2 positions shown · non-contrast
Comparison: Chest x-ray dated 09/12/2016.

CLINICAL DATA: Pt states she has had a productive cough and chest
congestion with mild cp onset for 2 weeks. PT denies injury or
surgery to chest.

EXAM:
CHEST  2 VIEW

[chest pa]
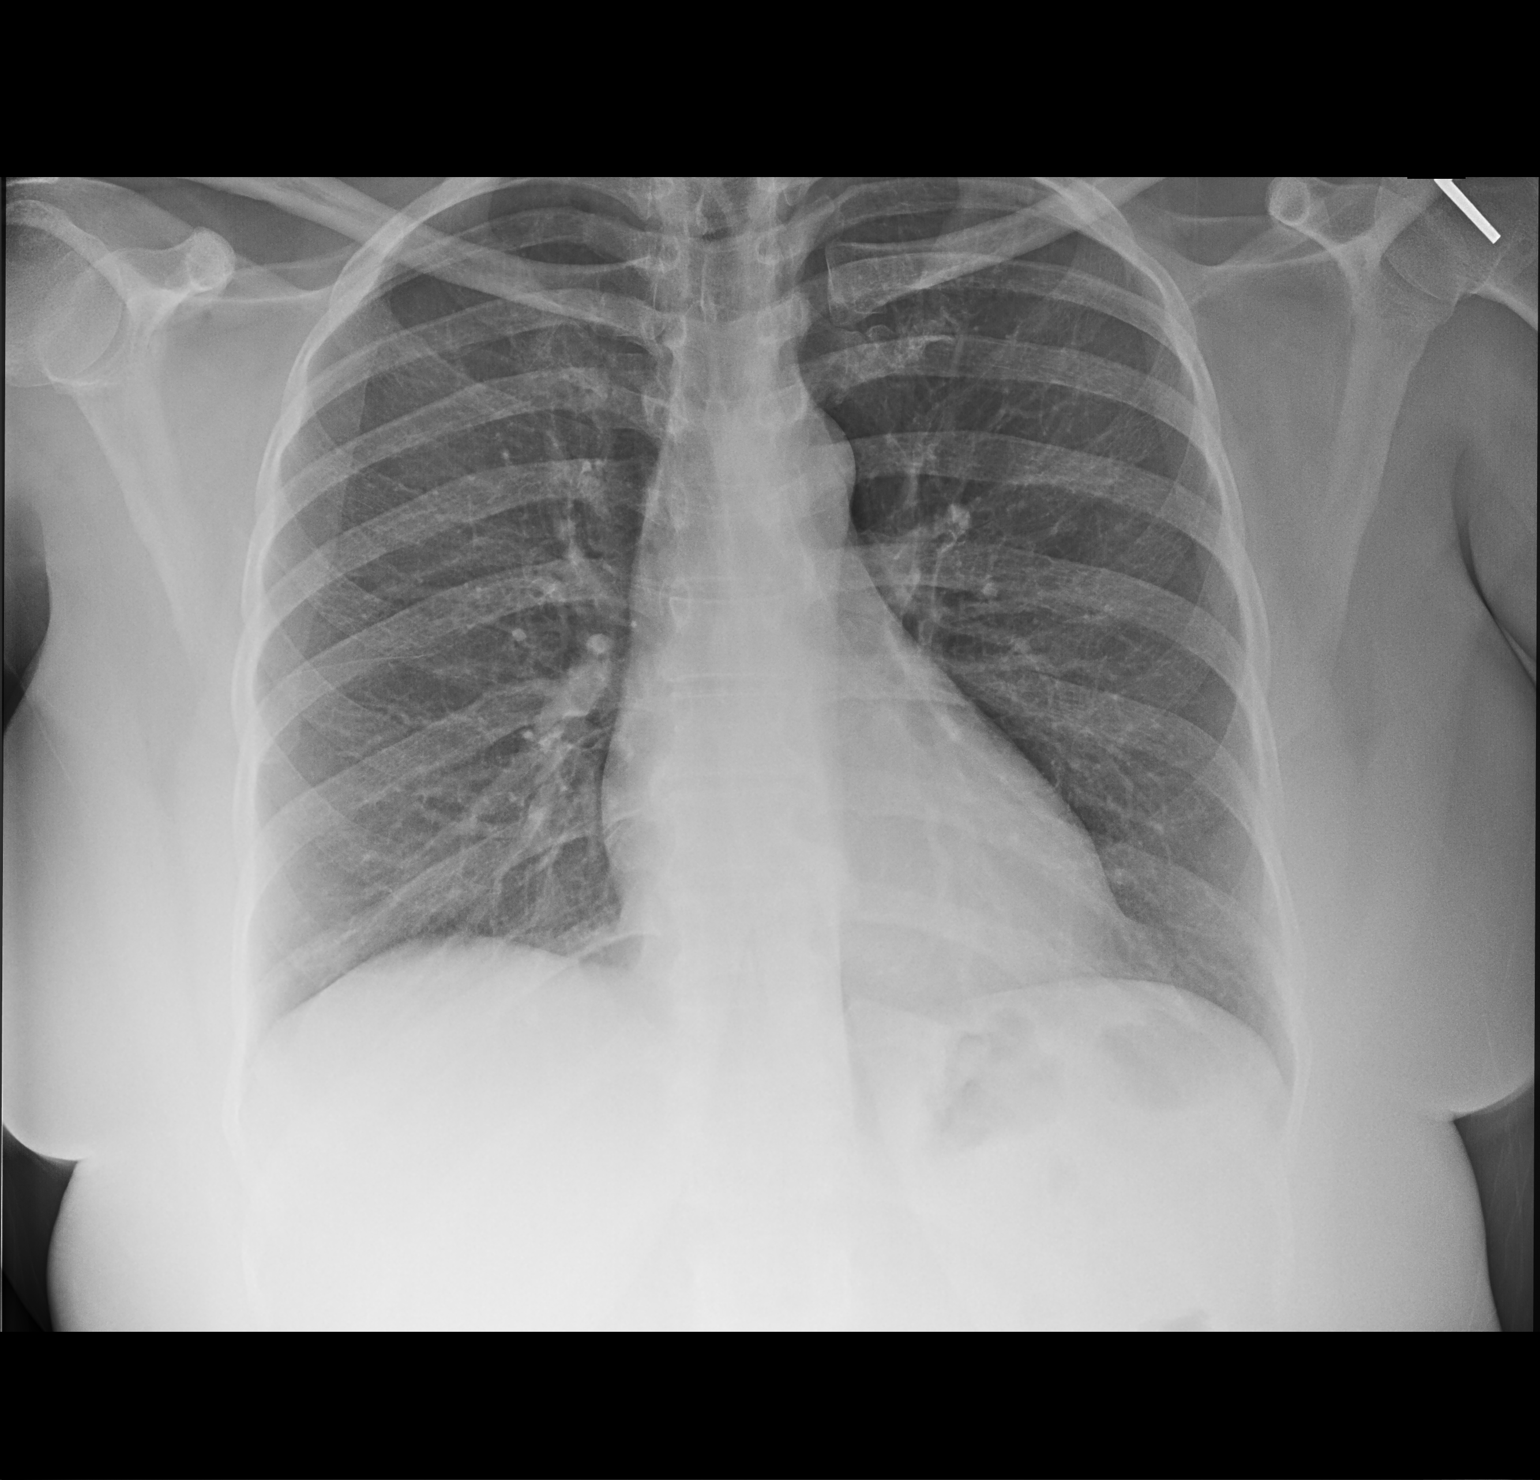

[chest lat]
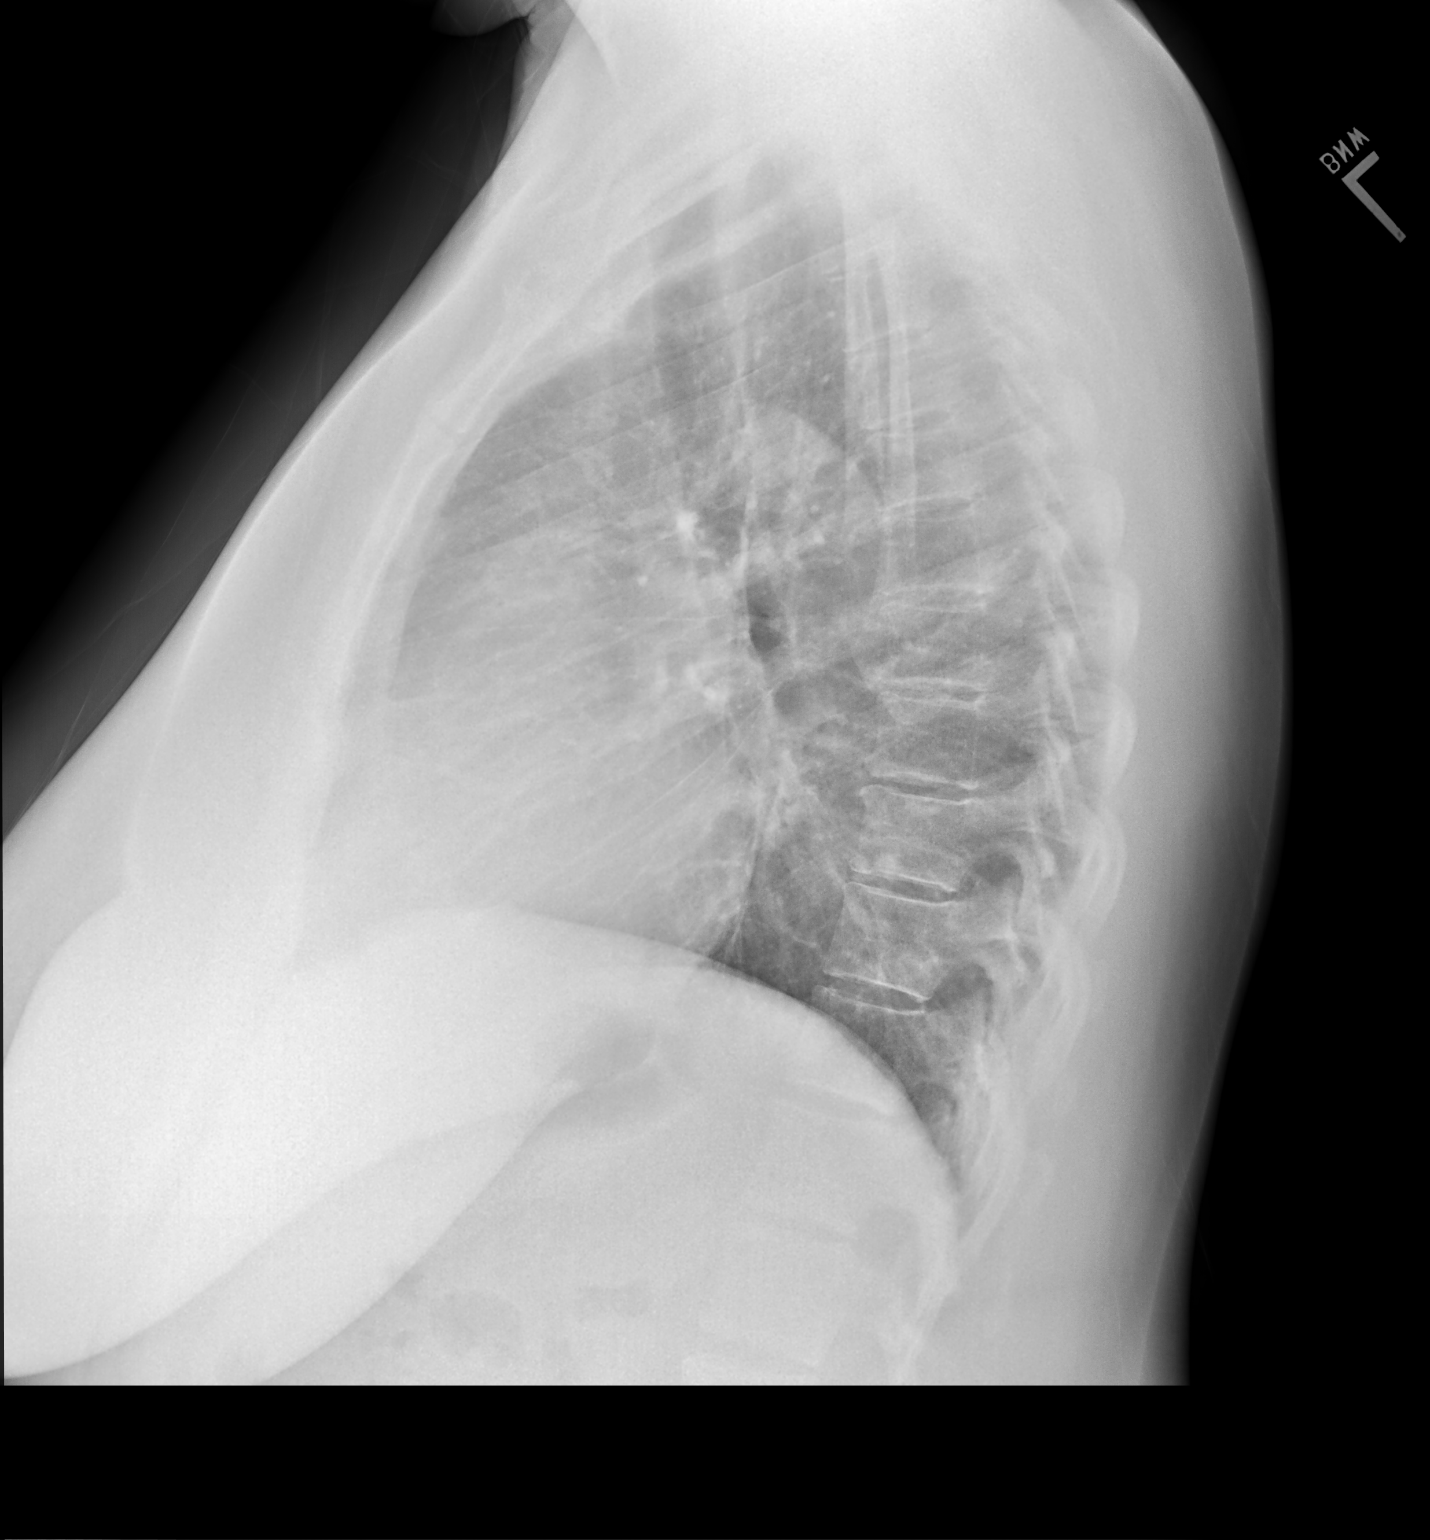

[2 of 2 positions shown; findings below may reference images not displayed]

FINDINGS: The heart size and mediastinal contours are within normal limits.
Both lungs are clear. The visualized skeletal structures are
unremarkable.
IMPRESSION: No active cardiopulmonary disease. No evidence of pneumonia or
pulmonary edema.

## 2018-10-09 ENCOUNTER — Other Ambulatory Visit: Payer: Self-pay | Admitting: Internal Medicine

## 2018-10-23 ENCOUNTER — Other Ambulatory Visit: Payer: Self-pay | Admitting: Internal Medicine

## 2018-10-23 DIAGNOSIS — Z1231 Encounter for screening mammogram for malignant neoplasm of breast: Secondary | ICD-10-CM

## 2018-10-25 ENCOUNTER — Encounter: Payer: Self-pay | Admitting: Internal Medicine

## 2018-10-25 ENCOUNTER — Ambulatory Visit: Payer: BC Managed Care – PPO | Admitting: Internal Medicine

## 2018-10-25 ENCOUNTER — Ambulatory Visit (INDEPENDENT_AMBULATORY_CARE_PROVIDER_SITE_OTHER): Payer: BC Managed Care – PPO

## 2018-10-25 VITALS — BP 128/68 | HR 96 | Temp 98.2°F | Ht 66.0 in | Wt 224.2 lb

## 2018-10-25 DIAGNOSIS — I10 Essential (primary) hypertension: Secondary | ICD-10-CM | POA: Diagnosis not present

## 2018-10-25 DIAGNOSIS — R0781 Pleurodynia: Secondary | ICD-10-CM | POA: Diagnosis not present

## 2018-10-25 LAB — TROPONIN I

## 2018-10-25 LAB — D-DIMER, QUANTITATIVE (NOT AT ARMC): D DIMER QUANT: 0.41 ug{FEU}/mL (ref <0.50)

## 2018-10-25 NOTE — Progress Notes (Signed)
Pre visit review using our clinic review tool, if applicable. No additional management support is needed unless otherwise documented below in the visit note. 

## 2018-10-25 NOTE — Patient Instructions (Addendum)
If this is musculoskeletal please try Ibuprofen over the counter with Tylenol as needed  We are working up for heart or lung condition today   Nonspecific Chest Pain, Adult Chest pain can be caused by many different conditions. It can be caused by a condition that is life-threatening and requires treatment right away. It can also be caused by something that is not life-threatening. If you have chest pain, it can be hard to know the difference, so it is important to get help right away to make sure that you do not have a serious condition. Some life-threatening causes of chest pain include:  Heart attack.  A tear in the body's main blood vessel (aortic dissection).  Inflammation around your heart (pericarditis).  A problem in the lungs, such as a blood clot (pulmonary embolism) or a collapsed lung (pneumothorax). Some non life-threatening causes of chest pain include:  Heartburn.  Anxiety or stress.  Damage to the bones, muscles, and cartilage that make up your chest wall.  Pneumonia or bronchitis.  Shingles infection (varicella-zoster virus). Chest pain can feel like:  Pain or discomfort on the surface of your chest or deep in your chest.  Crushing, pressure, aching, or squeezing pain.  Burning or tingling.  Dull or sharp pain that is worse when you move, cough, or take a deep breath.  Pain or discomfort that is also felt in your back, neck, jaw, shoulder, or arm, or pain that spreads to any of these areas. Your chest pain may come and go. It may also be constant. Your health care provider will do lab tests and other studies to find the cause of your pain. Treatment will depend on the cause of your chest pain. Follow these instructions at home: Medicines  Take over-the-counter and prescription medicines only as told by your health care provider.  If you were prescribed an antibiotic, take it as told by your health care provider. Do not stop taking the antibiotic even if you  start to feel better. Lifestyle   Rest as directed by your health care provider.  Do not use any products that contain nicotine or tobacco, such as cigarettes and e-cigarettes. If you need help quitting, ask your health care provider.  Do not drink alcohol.  Make healthy lifestyle choices as recommended. These may include: ? Getting regular exercise. Ask your health care provider to suggest some activities that are safe for you. ? Eating a heart-healthy diet. This includes plenty of fresh fruits and vegetables, whole grains, low-fat (lean) protein, and low-fat dairy products. A dietitian can help you find healthy eating options. ? Maintaining a healthy weight. ? Managing any other health conditions you have, such as high blood pressure (hypertension) or diabetes. ? Reducing stress, such as with yoga or relaxation techniques. General instructions  Pay attention to any changes in your symptoms. Tell your health care provider about them or any new symptoms.  Avoid any activities that cause chest pain.  Keep all follow-up visits as told by your health care provider. This is important. This includes visits for any further testing if your chest pain does not go away. Contact a health care provider if:  Your chest pain does not go away.  You feel depressed.  You have a fever. Get help right away if:  Your chest pain gets worse.  You have a cough that gets worse, or you cough up blood.  You have severe pain in your abdomen.  You faint.  You have sudden, unexplained chest  discomfort.  You have sudden, unexplained discomfort in your arms, back, neck, or jaw.  You have shortness of breath at any time.  You suddenly start to sweat, or your skin gets clammy.  You feel nausea or you vomit.  You suddenly feel lightheaded or dizzy.  You have severe weakness, or unexplained weakness or fatigue.  Your heart begins to beat quickly, or it feels like it is skipping beats. These  symptoms may represent a serious problem that is an emergency. Do not wait to see if the symptoms will go away. Get medical help right away. Call your local emergency services (911 in the [place] Do not drive yourself to the hospital. Summary  Chest pain can be caused by a condition that is serious and requires urgent treatment. It may also be caused by something that is not life-threatening.  If you have chest pain, it is very important to see your health care provider. Your health care provider may do lab tests and other studies to find the cause of your pain.  Follow your health care provider's instructions on taking medicines, making lifestyle changes, and getting emergency treatment if symptoms become worse.  Keep all follow-up visits as told by your health care provider. This includes visits for any further testing if your chest pain does not go away. This information is not intended to replace advice given to you by your health care provider. Make sure you discuss any questions you have with your health care provider. Document Released: 05/25/2005 Document Revised: 02/15/2018 Document Reviewed: 02/15/2018 Elsevier Interactive Patient Education  2019 [employer].

## 2018-10-25 NOTE — Progress Notes (Signed)
Chief Complaint  Patient presents with  . Back Pain    right side and right chest pain x 1 day   Acute visit  1. 2 weeks ago she had URI/sinus pain, post nasal drip with cough but tried otc meds Mucinex/Robitussin improved. Yesterday she had back pain right sided radiating to chest sharp with taking deep breath or coughing worse or clearing her throat worse. Pain radiates from right mid back to right chest not left sided. GERD is controlled, no dysphagia, no n/v, sob, fever. She does have h/o 4 mm lung nodule in 2018 and is not a smoker and last year H/o Dr. B tried to get approved repeat CT chest but insurance denied. She denies heavy lifting. She tried Tylenol which helped some. She is not anxious today   2. HTn controlled today on maxzide 37.5-25 mg qd felodipine 5 mg   Review of Systems  Constitutional: Negative for fever.  HENT: Negative for sinus pain.   Eyes: Negative for blurred vision.  Respiratory: Positive for cough. Negative for shortness of breath and wheezing.   Cardiovascular: Positive for chest pain.  Gastrointestinal: Negative for heartburn, nausea and vomiting.  Musculoskeletal: Positive for back pain.   Past Medical History:  Diagnosis Date  . Arthritis   . Endometriosis   . Environmental allergies   . GERD (gastroesophageal reflux disease)   . Hypercholesterolemia   . Hypertension   . Uterine fibroid    Past Surgical History:  Procedure Laterality Date  . ABDOMINAL HYSTERECTOMY    . CESAREAN SECTION     x2  . OOPHORECTOMY     S/P left secondary to cyst  . THYROGLOSSAL DUCT CYST N/A 05/27/2015   Procedure: THYROGLOSSAL DUCT CYST EXCISION ;  Surgeon: Clyde Canterbury, MD;  Location: [facility] ORS;  Service: ENT;  Laterality: N/A;   Family History  Problem Relation Age of Onset  . Heart attack Father 55  . Heart disease Father        bypass surgery  . Hypertension Father   . Hypercholesterolemia Father   . Kidney disease Father   . Diabetes Mother   . Diabetes  Other        aunt  . Breast cancer Neg Hx    Social History   Socioeconomic History  . Marital status: Married    Spouse name: Not on file  . Number of children: 2  . Years of education: Not on file  . Highest education level: Not on file  Occupational History  . Not on file  Social Needs  . Financial resource strain: Not on file  . Food insecurity:    Worry: Not on file    Inability: Not on file  . Transportation needs:    Medical: Not on file    Non-medical: Not on file  Tobacco Use  . Smoking status: Never Smoker  . Smokeless tobacco: Never Used  Substance and Sexual Activity  . Alcohol use: No    Alcohol/week: 0.0 standard drinks  . Drug use: No  . Sexual activity: Not Currently  Lifestyle  . Physical activity:    Days per week: Not on file    Minutes per session: Not on file  . Stress: Not on file  Relationships  . Social connections:    Talks on phone: Not on file    Gets together: Not on file    Attends religious service: Not on file    Active member of club or organization: Not on file  Attends meetings of clubs or organizations: Not on file    Relationship status: Not on file  . Intimate partner violence:    Fear of current or ex partner: Not on file    Emotionally abused: Not on file    Physically abused: Not on file    Forced sexual activity: Not on file  Other Topics Concern  . Not on file  Social History Narrative   Widowed    Current Meds  Medication Sig  . atorvastatin (LIPITOR) 20 MG tablet Take 1 tablet (20 mg total) by mouth daily.  . cetirizine (ZYRTEC ALLERGY) 10 MG tablet Take 1 tablet (10 mg total) by mouth daily.  . felodipine (PLENDIL) 5 MG 24 hr tablet TAKE 1 TABLET BY MOUTH EVERY DAY  . fluticasone (FLONASE) 50 MCG/ACT nasal spray Place 2 sprays into both nostrils daily.  . pantoprazole (PROTONIX) 40 MG tablet TAKE 1 TABLET BY MOUTH EVERY DAY  . triamterene-hydrochlorothiazide (MAXZIDE-25) 37.5-25 MG tablet TAKE 0.5 TABLETS BY  MOUTH DAILY.   Allergies  Allergen Reactions  . Cefdinir Itching  . Levofloxacin Other (See Comments)    Joint pain  . Simvastatin Other (See Comments)    "muscle aches"   No results found for this or any previous visit (from the past 2160 hour(s)). Objective  Body mass index is 36.19 kg/m. Wt Readings from Last 3 Encounters:  10/25/18 224 lb 3.2 oz (101.7 kg)  07/25/18 220 lb 3.2 oz (99.9 kg)  04/20/18 216 lb (98 kg)   Temp Readings from Last 3 Encounters:  10/25/18 98.2 F (36.8 C) (Oral)  07/25/18 (!) 97.4 F (36.3 C) (Oral)  03/22/18 97.8 F (36.6 C) (Oral)   BP Readings from Last 3 Encounters:  10/25/18 128/68  07/25/18 132/78  04/20/18 (!) 140/92   Pulse Readings from Last 3 Encounters:  10/25/18 96  07/25/18 89  04/20/18 87    Physical Exam Vitals signs and nursing note reviewed.  Constitutional:      Appearance: Normal appearance. She is well-developed and well-groomed.  HENT:     Head: Normocephalic and atraumatic.     Nose: Nose normal.     Mouth/Throat:     Mouth: Mucous membranes are moist.     Pharynx: Oropharynx is clear.  Eyes:     Conjunctiva/sclera: Conjunctivae normal.     Pupils: Pupils are equal, round, and reactive to light.  Cardiovascular:     Rate and Rhythm: Normal rate and regular rhythm.     Heart sounds: Normal heart sounds. No murmur.  Pulmonary:     Effort: Pulmonary effort is normal.     Breath sounds: Normal breath sounds. No wheezing.    Chest:    Skin:    General: Skin is warm and dry.  Neurological:     General: No focal deficit present.     Mental Status: She is alert and oriented to person, place, and time. Mental status is at baseline.     Gait: Gait normal.  Psychiatric:        Attention and Perception: Attention and perception normal.        Mood and Affect: Mood and affect normal.        Speech: Speech normal.        Behavior: Behavior normal. Behavior is cooperative.        Thought Content: Thought  content normal.        Cognition and Memory: Cognition and memory normal.  Judgment: Judgment normal.     Assessment   1. Pleuritic CP which is reproducible right sided back and chest ddx costochondritis/MSK 2/2 coughing over the last 2 weeks, r/o pneumonia, r/o PE, cardiac etiology. Pt is not anxious today.  Echo done in 11/24/2016 did show mild pulmonary HTN (see below) NORMAL LEFT VENTRICULAR SYSTOLIC FUNCTION WITH MILD LVH NORMAL RIGHT VENTRICULAR SYSTOLIC FUNCTION MILD VALVULAR REGURGITATION (See above) NO VALVULAR STENOSIS MILD PHTN EF >55% 2. HTN controlled  Plan   1. Stat troponin, D dimer today, CXR r/o pneumonia  rec trial of NSAIDS  otc with Tylenol if this is MSK/Pleuritic chest pain due to reproducible on exam  GERD is controlled not likely GERD  -if D dimer + will do CTA chest  2. Cont meds   Provider: Dr. Olivia Mackie McLean-Scocuzza-Internal Medicine

## 2018-10-26 ENCOUNTER — Telehealth: Payer: Self-pay

## 2018-10-26 NOTE — Telephone Encounter (Signed)
Brock,CMA is calling patient again.  Please see result note.

## 2018-10-26 NOTE — Telephone Encounter (Signed)
Copied from [place] [id number]. Topic: Quick Communication - See Telephone Encounter >> Oct 26, 2018 11:15 AM Babs Bertin, CMA wrote: CRM for notification. See Telephone encounter for: 10/26/18. >> Oct 26, 2018  3:37 PM Vernona Rieger wrote: Patient is calling back, see no tele encounter for 2/28. Please advise

## 2018-11-02 ENCOUNTER — Encounter: Payer: Self-pay | Admitting: Internal Medicine

## 2018-11-02 ENCOUNTER — Ambulatory Visit: Payer: BC Managed Care – PPO | Admitting: Internal Medicine

## 2018-11-02 VITALS — BP 122/70 | HR 83 | Temp 97.8°F | Resp 16 | Wt 223.0 lb

## 2018-11-02 DIAGNOSIS — M549 Dorsalgia, unspecified: Secondary | ICD-10-CM

## 2018-11-02 DIAGNOSIS — F439 Reaction to severe stress, unspecified: Secondary | ICD-10-CM

## 2018-11-02 DIAGNOSIS — I10 Essential (primary) hypertension: Secondary | ICD-10-CM | POA: Diagnosis not present

## 2018-11-02 DIAGNOSIS — D472 Monoclonal gammopathy: Secondary | ICD-10-CM

## 2018-11-02 NOTE — Progress Notes (Signed)
Patient ID: Kathleen Gould, female   DOB: [birth year], 53 y.o.   MRN: [id number]   Subjective:    Patient ID: Kathleen Gould, female    DOB: September 05, 1965, 53 y.o.   MRN: [id number]  HPI  Patient here as a work in to follow up regarding acute visit for right side back pain.  Saw Dr Terese Door 10/25/18.  Had cxr and labs (troponin and D-Dimer) - unrevealing.  Felt to be msk in origin.  Took tylenol and two aleve.  Pain resolved now.  No pain currently.  Denied any trauma or injury.  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  Was tender to palpation.  Some increased discomfort with rotation and certain movements.  No pain now.  No sob. Eating and drinking well.  No nausea or vomiting.  Bowels moving.  Handling stress.  Discussed with her today.     Past Medical History:  Diagnosis Date  . Arthritis   . Endometriosis   . Environmental allergies   . GERD (gastroesophageal reflux disease)   . Hypercholesterolemia   . Hypertension   . Uterine fibroid    Past Surgical History:  Procedure Laterality Date  . ABDOMINAL HYSTERECTOMY    . CESAREAN SECTION     x2  . OOPHORECTOMY     S/P left secondary to cyst  . THYROGLOSSAL DUCT CYST N/A 05/27/2015   Procedure: THYROGLOSSAL DUCT CYST EXCISION ;  Surgeon: Clyde Canterbury, MD;  Location: [facility] ORS;  Service: ENT;  Laterality: N/A;   Family History  Problem Relation Age of Onset  . Heart attack Father 13  . Heart disease Father        bypass surgery  . Hypertension Father   . Hypercholesterolemia Father   . Kidney disease Father   . Diabetes Mother   . Diabetes Other        aunt  . Breast cancer Neg Hx    Social History   Socioeconomic History  . Marital status: Married    Spouse name: Not on file  . Number of children: 2  . Years of education: Not on file  . Highest education level: Not on file  Occupational History  . Not on file  Social Needs  . Financial resource strain: Not on file  .  Food insecurity:    Worry: Not on file    Inability: Not on file  . Transportation needs:    Medical: Not on file    Non-medical: Not on file  Tobacco Use  . Smoking status: Never Smoker  . Smokeless tobacco: Never Used  Substance and Sexual Activity  . Alcohol use: No    Alcohol/week: 0.0 standard drinks  . Drug use: No  . Sexual activity: Not Currently  Lifestyle  . Physical activity:    Days per week: Not on file    Minutes per session: Not on file  . Stress: Not on file  Relationships  . Social connections:    Talks on phone: Not on file    Gets together: Not on file    Attends religious service: Not on file    Active member of club or organization: Not on file    Attends meetings of clubs or organizations: Not on file    Relationship status: Not on file  Other Topics Concern  . Not on file  Social History Narrative   Widowed     Outpatient Encounter Medications as of 11/02/2018  Medication Sig  .  atorvastatin (LIPITOR) 20 MG tablet Take 1 tablet (20 mg total) by mouth daily.  . cetirizine (ZYRTEC ALLERGY) 10 MG tablet Take 1 tablet (10 mg total) by mouth daily.  . felodipine (PLENDIL) 5 MG 24 hr tablet TAKE 1 TABLET BY MOUTH EVERY DAY  . fluticasone (FLONASE) 50 MCG/ACT nasal spray Place 2 sprays into both nostrils daily.  . pantoprazole (PROTONIX) 40 MG tablet TAKE 1 TABLET BY MOUTH EVERY DAY  . triamterene-hydrochlorothiazide (MAXZIDE-25) 37.5-25 MG tablet TAKE 0.5 TABLETS BY MOUTH DAILY.   No facility-administered encounter medications on file as of 11/02/2018.     Review of Systems  Constitutional: Negative for appetite change, fever and unexpected weight change.  HENT: Negative for congestion.   Respiratory: Negative for cough, chest tightness and shortness of breath.   Cardiovascular: Negative for chest pain and leg swelling.  Gastrointestinal: Negative for abdominal pain, nausea and vomiting.  Genitourinary: Negative for difficulty urinating and dysuria.    Musculoskeletal: Negative for joint swelling and myalgias.  Skin: Negative for color change and rash.  Neurological: Negative for dizziness and headaches.  Psychiatric/Behavioral: Negative for agitation and dysphoric mood.       Objective:    Physical Exam Constitutional:      General: She is not in acute distress.    Appearance: Normal appearance.  HENT:     Nose: Nose normal. No congestion.     Mouth/Throat:     Pharynx: No oropharyngeal exudate or posterior oropharyngeal erythema.  Neck:     Musculoskeletal: Neck supple. No muscular tenderness.     Thyroid: No thyromegaly.  Cardiovascular:     Rate and Rhythm: Normal rate and regular rhythm.  Pulmonary:     Effort: No respiratory distress.     Breath sounds: Normal breath sounds. No wheezing.     Comments: Good breath sounds bilaterally.   Abdominal:     General: Bowel sounds are normal.     Palpations: Abdomen is soft.     Tenderness: There is no abdominal tenderness.  Musculoskeletal:        General: No swelling or tenderness.     Comments: No pain to palpation over there right posterior back or anterior chest.    Lymphadenopathy:     Cervical: No cervical adenopathy.  Skin:    Findings: No erythema or rash.  Neurological:     Mental Status: She is alert.  Psychiatric:        Behavior: Behavior normal.     BP 122/70   Pulse 83   Temp 97.8 F (36.6 C) (Oral)   Resp 16   Wt 223 lb (101.2 kg)   LMP 11/06/2000   SpO2 98%   BMI 35.99 kg/m  Wt Readings from Last 3 Encounters:  11/02/18 223 lb (101.2 kg)  10/25/18 224 lb 3.2 oz (101.7 kg)  07/25/18 220 lb 3.2 oz (99.9 kg)     Lab Results  Component Value Date   WBC 5.5 11/14/2017   HGB 13.7 11/14/2017   HCT 40.9 11/14/2017   PLT 425 11/14/2017   GLUCOSE 98 07/16/2018   CHOL 216 (H) 07/16/2018   TRIG 63.0 07/16/2018   HDL 75.20 07/16/2018   LDLDIRECT 164.5 07/24/2013   LDLCALC 129 (H) 07/16/2018   ALT 15 07/16/2018   AST 15 07/16/2018   NA 142  07/16/2018   K 3.6 07/16/2018   CL 105 07/16/2018   CREATININE 0.77 07/16/2018   BUN 11 07/16/2018   CO2 30 07/16/2018   TSH  1.97 03/20/2018   INR 1.0 04/21/2014    Dg Chest 2 View  Result Date: 03/27/2018 CLINICAL DATA:  53 year old female with 4 millimeter right middle lobe lung nodule discovered on chest CT without contrast in June 2018. Subsequent encounter. EXAM: CHEST - 2 VIEW COMPARISON:  Chest CT 02/15/2017. Chest radiograph 02/08/2017 and earlier. FINDINGS: The small right middle lobe lung was occult nodule seen by CT radiographically. And today the lungs appear stable in clear. Lung volumes and mediastinal contours remain normal. Visualized tracheal air column is within normal limits. No pneumothorax or pleural effusion. No acute osseous abnormality identified. Negative visible bowel gas pattern. IMPRESSION: 1. The right middle lobe lung nodule seen by CT in June 2018 remains radiographically occult. Chest CT would best evaluate stability or resolution of the nodule as necessary. 2. Stable and negative radiographic appearance of the chest. Electronically Signed   By: Genevie Ann M.D.   On: 03/27/2018 09:37       Assessment & Plan:   Problem List Items Addressed This Visit    Essential hypertension, benign    Blood pressure under good control.  Continue same medication regimen.  Follow pressures.  Follow metabolic panel.        MGUS (monoclonal gammopathy of unknown significance)    Followed by hematology.  Has f/u soon.  Has been relatively stable.       Stress    Discussed with her today.  Feels she is handling things relatively well.  Follow.         Other Visit Diagnoses    Acute back pain, unspecified back location, unspecified back pain laterality    -  Primary   Previous posterior right side/back pain and right anterior chest pain resolved.  No pain in exam. No further symptoms.        Einar Pheasant, MD

## 2018-11-03 ENCOUNTER — Encounter: Payer: Self-pay | Admitting: Internal Medicine

## 2018-11-03 NOTE — Assessment & Plan Note (Signed)
Discussed with her today.  Feels she is handling things relatively well.  Follow.   

## 2018-11-03 NOTE — Assessment & Plan Note (Signed)
Blood pressure under good control.  Continue same medication regimen.  Follow pressures.  Follow metabolic panel.   

## 2018-11-03 NOTE — Assessment & Plan Note (Addendum)
Followed by hematology.  Has f/u soon.  Has been relatively stable.

## 2018-11-13 ENCOUNTER — Other Ambulatory Visit: Payer: BC Managed Care – PPO

## 2018-11-13 ENCOUNTER — Ambulatory Visit: Payer: BC Managed Care – PPO | Admitting: Hematology and Oncology

## 2018-11-14 ENCOUNTER — Ambulatory Visit: Payer: BC Managed Care – PPO | Admitting: Hematology and Oncology

## 2018-11-14 ENCOUNTER — Other Ambulatory Visit: Payer: BC Managed Care – PPO

## 2018-11-20 ENCOUNTER — Other Ambulatory Visit: Payer: Self-pay | Admitting: Internal Medicine

## 2018-11-20 DIAGNOSIS — K219 Gastro-esophageal reflux disease without esophagitis: Secondary | ICD-10-CM

## 2018-11-26 ENCOUNTER — Ambulatory Visit: Payer: BC Managed Care – PPO

## 2018-12-04 ENCOUNTER — Other Ambulatory Visit: Payer: Self-pay

## 2018-12-04 DIAGNOSIS — D472 Monoclonal gammopathy: Secondary | ICD-10-CM

## 2018-12-05 ENCOUNTER — Inpatient Hospital Stay: Payer: BC Managed Care – PPO | Attending: Hematology and Oncology

## 2018-12-05 ENCOUNTER — Other Ambulatory Visit: Payer: Self-pay

## 2018-12-05 DIAGNOSIS — D472 Monoclonal gammopathy: Secondary | ICD-10-CM | POA: Diagnosis not present

## 2018-12-05 LAB — COMPREHENSIVE METABOLIC PANEL
ALT: 17 U/L (ref 0–44)
AST: 16 U/L (ref 15–41)
Albumin: 4.5 g/dL (ref 3.5–5.0)
Alkaline Phosphatase: 81 U/L (ref 38–126)
Anion gap: 9 (ref 5–15)
BUN: 12 mg/dL (ref 6–20)
CO2: 25 mmol/L (ref 22–32)
Calcium: 9.6 mg/dL (ref 8.9–10.3)
Chloride: 103 mmol/L (ref 98–111)
Creatinine, Ser: 0.68 mg/dL (ref 0.44–1.00)
GFR calc Af Amer: >60 mL/min (ref >60)
GFR calc non Af Amer: >60 mL/min (ref >60)
Glucose, Bld: 104 mg/dL — ABNORMAL HIGH (ref 70–99)
Potassium: 3.5 mmol/L (ref 3.5–5.1)
Sodium: 137 mmol/L (ref 135–145)
Total Bilirubin: 0.7 mg/dL (ref 0.3–1.2)
Total Protein: 8.4 g/dL — ABNORMAL HIGH (ref 6.5–8.1)

## 2018-12-05 LAB — CBC WITH DIFFERENTIAL/PLATELET
Abs Immature Granulocytes: 0.01 10*3/uL (ref 0.00–0.07)
Basophils Absolute: 0 10*3/uL (ref 0.0–0.1)
Basophils Relative: 0 %
Eosinophils Absolute: 0.1 10*3/uL (ref 0.0–0.5)
Eosinophils Relative: 2 %
HCT: 40.7 % (ref 36.0–46.0)
Hemoglobin: 13.6 g/dL (ref 12.0–15.0)
Immature Granulocytes: 0 %
Lymphocytes Relative: 29 %
Lymphs Abs: 1.9 10*3/uL (ref 0.7–4.0)
MCH: 28.6 pg (ref 26.0–34.0)
MCHC: 33.4 g/dL (ref 30.0–36.0)
MCV: 85.5 fL (ref 80.0–100.0)
Monocytes Absolute: 0.4 10*3/uL (ref 0.1–1.0)
Monocytes Relative: 5 %
Neutro Abs: 4.1 10*3/uL (ref 1.7–7.7)
Neutrophils Relative %: 64 %
Platelets: 428 10*3/uL — ABNORMAL HIGH (ref 150–400)
RBC: 4.76 MIL/uL (ref 3.87–5.11)
RDW: 13.5 % (ref 11.5–15.5)
WBC: 6.5 10*3/uL (ref 4.0–10.5)
nRBC: 0 % (ref 0.0–0.2)

## 2018-12-06 ENCOUNTER — Other Ambulatory Visit: Payer: Self-pay

## 2018-12-06 LAB — MULTIPLE MYELOMA PANEL, SERUM
Albumin SerPl Elph-Mcnc: 4.2 g/dL (ref 2.9–4.4)
Albumin/Glob SerPl: 1.3 (ref 0.7–1.7)
Alpha 1: 0.2 g/dL (ref 0.0–0.4)
Alpha2 Glob SerPl Elph-Mcnc: 0.7 g/dL (ref 0.4–1.0)
B-Globulin SerPl Elph-Mcnc: 1.3 g/dL (ref 0.7–1.3)
Gamma Glob SerPl Elph-Mcnc: 1.2 g/dL (ref 0.4–1.8)
Globulin, Total: 3.5 g/dL (ref 2.2–3.9)
IgA: 277 mg/dL (ref 87–352)
IgG (Immunoglobin G), Serum: 1325 mg/dL (ref 586–1602)
IgM (Immunoglobulin M), Srm: 127 mg/dL (ref 26–217)
M Protein SerPl Elph-Mcnc: 0.2 g/dL — ABNORMAL HIGH
Total Protein ELP: 7.7 g/dL (ref 6.0–8.5)

## 2018-12-06 LAB — KAPPA/LAMBDA LIGHT CHAINS
Kappa free light chain: 16.9 mg/L (ref 3.3–19.4)
Kappa, lambda light chain ratio: 1.43 (ref 0.26–1.65)
Lambda free light chains: 11.8 mg/L (ref 5.7–26.3)

## 2018-12-07 ENCOUNTER — Other Ambulatory Visit: Payer: Self-pay | Admitting: Internal Medicine

## 2018-12-07 ENCOUNTER — Inpatient Hospital Stay: Payer: BC Managed Care – PPO | Admitting: Hematology and Oncology

## 2018-12-07 ENCOUNTER — Other Ambulatory Visit: Payer: BC Managed Care – PPO

## 2018-12-10 ENCOUNTER — Other Ambulatory Visit: Payer: BC Managed Care – PPO

## 2018-12-12 ENCOUNTER — Encounter: Payer: BC Managed Care – PPO | Admitting: Internal Medicine

## 2018-12-24 ENCOUNTER — Ambulatory Visit (INDEPENDENT_AMBULATORY_CARE_PROVIDER_SITE_OTHER): Payer: BC Managed Care – PPO | Admitting: Family Medicine

## 2018-12-24 ENCOUNTER — Other Ambulatory Visit: Payer: Self-pay

## 2018-12-24 ENCOUNTER — Ambulatory Visit: Payer: Self-pay | Admitting: Internal Medicine

## 2018-12-24 DIAGNOSIS — R39198 Other difficulties with micturition: Secondary | ICD-10-CM

## 2018-12-24 DIAGNOSIS — R110 Nausea: Secondary | ICD-10-CM

## 2018-12-24 MED ORDER — ONDANSETRON 4 MG PO TBDP: 4.0000 mg | ORAL_TABLET | Freq: Three times a day (TID) | ORAL | 0 refills | Status: DC | PRN

## 2018-12-24 MED ORDER — TAMSULOSIN HCL 0.4 MG PO CAPS: 0.4000 mg | ORAL_CAPSULE | Freq: Every day | ORAL | 0 refills | Status: DC

## 2018-12-24 NOTE — Progress Notes (Signed)
Patient ID: Kathleen Gould, female   DOB: 1966-02-14, 53 y.o.   MRN: [id number]  Virtual Visit via video Note  This visit type was conducted due to national recommendations for restrictions regarding the COVID-19 pandemic (e.g. social distancing).  This format is felt to be most appropriate for this patient at this time.  All issues noted in this document were discussed and addressed.  No physical exam was performed (except for noted visual exam findings with Video Visits).   I connected with Adline Potter on 12/24/18 at  8:40 AM EDT by a telephone and verified that I am speaking with the correct person using two identifiers. Location patient: home Location provider: work or home office Persons participating in the virtual visit: patient, provider  I discussed the limitations, risks, security and privacy concerns of performing an evaluation and management service by telephone and the availability of in person appointments. I also discussed with the patient that there may be a patient responsible charge related to this service. The patient expressed understanding and agreed to proceed.  Interactive audio and video telecommunications were attempted between this provider and patient, however failed, due to patient having technical difficulties with her computer and phone camera is not working. We then completed visit over phone.   HPI:  Patient and I connected via telephone today due to complaints of difficulty passing urine.  She went to the urgent care on 12/22/2018 a [facility] and was diagnosed with dysuria and prescribed a seven-day course of Macrobid. I was able to view note from UC via care everywhere in epic. Urinalysis was completed and urine culture was sent out.  Urine culture should result catheter today or tomorrow.  Patient states she does not have much pain with urinating, states she is just not urinating much at all.  States she does use the restroom only small trickle will  come out, and does not feel like she is completely emptying her bladder.  Does report some mild flank pain.  Denies any history of kidney stones.  Also has begun to have some nausea.  Denies vomiting or diarrhea.  Denies chest pain.  Denies shortness of breath or wheezing.  Denies fever or chills.  Denies body aches.   ROS: See pertinent positives and negatives per HPI.  Past Medical History:  Diagnosis Date  . Arthritis   . Endometriosis   . Environmental allergies   . GERD (gastroesophageal reflux disease)   . Hypercholesterolemia   . Hypertension   . Uterine fibroid     Past Surgical History:  Procedure Laterality Date  . ABDOMINAL HYSTERECTOMY    . CESAREAN SECTION     x2  . OOPHORECTOMY     S/P left secondary to cyst  . THYROGLOSSAL DUCT CYST N/A 05/27/2015   Procedure: THYROGLOSSAL DUCT CYST EXCISION ;  Surgeon: Clyde Canterbury, MD;  Location: [facility] ORS;  Service: ENT;  Laterality: N/A;    Family History  Problem Relation Age of Onset  . Heart attack Father 61  . Heart disease Father        bypass surgery  . Hypertension Father   . Hypercholesterolemia Father   . Kidney disease Father   . Diabetes Mother   . Diabetes Other        aunt  . Breast cancer Neg Hx    Social History   Tobacco Use  . Smoking status: Never Smoker  . Smokeless tobacco: Never Used  Substance Use Topics  . Alcohol use: No  Alcohol/week: 0.0 standard drinks     Current Outpatient Medications:  .  atorvastatin (LIPITOR) 20 MG tablet, Take 1 tablet (20 mg total) by mouth daily., Disp: 30 tablet, Rfl: 5 .  cetirizine (ZYRTEC) 10 MG tablet, TAKE 1 TABLET BY MOUTH EVERY DAY, Disp: 30 tablet, Rfl: 1 .  felodipine (PLENDIL) 5 MG 24 hr tablet, TAKE 1 TABLET BY MOUTH EVERY DAY, Disp: 90 tablet, Rfl: 1 .  fluticasone (FLONASE) 50 MCG/ACT nasal spray, Place 2 sprays into both nostrils daily., Disp: 16 g, Rfl: 5 .  pantoprazole (PROTONIX) 40 MG tablet, TAKE 1 TABLET BY MOUTH EVERY DAY, Disp: 90  tablet, Rfl: 0 .  triamterene-hydrochlorothiazide (MAXZIDE-25) 37.5-25 MG tablet, TAKE 0.5 TABLETS BY MOUTH DAILY., Disp: 30 tablet, Rfl: 5  EXAM:  GENERAL: alert, oriented, sounds well and in no acute distress  LUNGS: Speaking in full sentences, no signs of respiratory distress, breathing rate appears normal, no gasping or wheezing  PSYCH/NEURO: pleasant and cooperative, no obvious depression or anxiety, speech and thought processing grossly intact  Urine results for UC on 12/22/2018:   Color Yellow Yellow  Clarity Clear Clear  Specific Gravity 1.010 1.000 - 1.030  pH, Urine 7.5 5.0 - 8.0  Protein, Urinalysis Negative Negative, Trace mg/dL  Glucose, Urinalysis Negative Negative mg/dL  Ketones, Urinalysis Negative Negative mg/dL  Blood, Urinalysis Negative Negative  Nitrite, Urinalysis Negative Negative  Leukocyte Esterase, Urinalysis Negative Negative  White Blood Cells, Urinalysis None Seen None Seen, 0-3 /hpf  Red Blood Cells, Urinalysis None Seen None Seen, 0-3 /hpf  Bacteria, Urinalysis None Seen None Seen /hpf  Squamous Epithelial Cells, Urinalysis None Seen Rare, Few, None Seen /hpf   ASSESSMENT AND PLAN:  Discussed the following assessment and plan:  Difficulty urinating - Plan: tamsulosin (FLOMAX) 0.4 MG CAPS capsule  Nausea - Plan: ondansetron (ZOFRAN-ODT) 4 MG disintegrating tablet  Urinalysis done at urgent care on 12/22/2018 overall is unremarkable for any obvious UTI.  Urine culture still pending at this time.  Discussed with patient that I am concerned about potentially her having a kidney stone and or other issue that is causing her to be having a tough time passing urine.  Advised patient that when we are unable to urinate a can create many different issues including kidney failure, sepsis and potentially other organ failure and death.  Recommended that we get a CT scan to rule out kidney stone or other acute intra-abdominal pathology, patient would prefer not to do  a CT scan at this time until she good urine culture results back.  Patient is agreeable to begin tamsulosin to see if this helps her ability to pass urine.  She will use Zofran as needed for nausea.  Advised that if tamsulosin does not help her to pass urine, urine culture comes back as negative for any UTI and she continues to have difficulty urinating nausea and she most likely will need to have a CT scan and or need to go to the emergency room to have a catheter placed.  Patient verbalized understanding of all these risks and states she will call back office and let us know when she has urine culture results and if she will be willing to proceed forward with imaging and/or blood work at that time.    I discussed the assessment and treatment plan with the patient. The patient was provided an opportunity to ask questions and all were answered. The patient agreed with the plan and demonstrated an understanding of the instructions.  The patient was advised to call back or seek an in-person evaluation if the symptoms worsen or if the condition fails to improve as anticipated.   Jodelle Green, FNP

## 2018-12-24 NOTE — Telephone Encounter (Signed)
Pt. Reports she started noticing difficulty with her urine stream - "not peeing much and I'm on a fluid pill." Developed nausea and headache Thursday. Went to [facility] over the weekend and started on [place]. Still having difficulty passing urine. Spoke with Rhasida in the office and they will reach out to pt. Answer Assessment - Initial Assessment Questions 1. SYMPTOM: "What's the main symptom you're concerned about?" (e.g., frequency, incontinence)     Not voiding much - last time this morning 2. ONSET: "When did the  Difficulty passing urine  start?"     Thursday 3. PAIN: "Is there any pain?" If so, ask: "How bad is it?" (Scale: 1-10; mild, moderate, severe)     No 4. CAUSE: "What do you think is causing the symptoms?"     UTI 5. OTHER SYMPTOMS: "Do you have any other symptoms?" (e.g., fever, flank pain, blood in urine, pain with urination)     Nausea, headache 6. PREGNANCY: "Is there any chance you are pregnant?" "When was your last menstrual period?"     No  Protocols used: URINARY [facility]

## 2018-12-24 NOTE — Telephone Encounter (Signed)
Pt was seen by NP this morning

## 2019-01-02 ENCOUNTER — Other Ambulatory Visit: Payer: Self-pay | Admitting: Internal Medicine

## 2019-01-08 ENCOUNTER — Ambulatory Visit: Payer: BC Managed Care – PPO

## 2019-01-15 ENCOUNTER — Other Ambulatory Visit: Payer: Self-pay | Admitting: Family Medicine

## 2019-01-15 DIAGNOSIS — R39198 Other difficulties with micturition: Secondary | ICD-10-CM

## 2019-01-26 ENCOUNTER — Other Ambulatory Visit: Payer: Self-pay | Admitting: Internal Medicine

## 2019-01-28 ENCOUNTER — Other Ambulatory Visit: Payer: Self-pay

## 2019-01-28 ENCOUNTER — Ambulatory Visit
Admission: RE | Admit: 2019-01-28 | Discharge: 2019-01-28 | Disposition: A | Discharge status: Home / Self Care | Payer: BC Managed Care – PPO | Source: Ambulatory Visit | Attending: Internal Medicine | Admitting: Internal Medicine

## 2019-01-28 DIAGNOSIS — Z1231 Encounter for screening mammogram for malignant neoplasm of breast: Secondary | ICD-10-CM | POA: Insufficient documentation

## 2019-02-06 ENCOUNTER — Other Ambulatory Visit (INDEPENDENT_AMBULATORY_CARE_PROVIDER_SITE_OTHER): Payer: BC Managed Care – PPO

## 2019-02-06 ENCOUNTER — Other Ambulatory Visit: Payer: Self-pay

## 2019-02-06 ENCOUNTER — Encounter: Payer: Self-pay | Admitting: Internal Medicine

## 2019-02-06 DIAGNOSIS — I10 Essential (primary) hypertension: Secondary | ICD-10-CM

## 2019-02-06 DIAGNOSIS — E7800 Pure hypercholesterolemia, unspecified: Secondary | ICD-10-CM | POA: Diagnosis not present

## 2019-02-06 LAB — HEPATIC FUNCTION PANEL
ALT: 11 U/L (ref 0–35)
AST: 13 U/L (ref 0–37)
Albumin: 4.3 g/dL (ref 3.5–5.2)
Alkaline Phosphatase: 75 U/L (ref 39–117)
Bilirubin, Direct: 0.1 mg/dL (ref 0.0–0.3)
Total Bilirubin: 0.6 mg/dL (ref 0.2–1.2)
Total Protein: 7.1 g/dL (ref 6.0–8.3)

## 2019-02-06 LAB — BASIC METABOLIC PANEL
BUN: 11 mg/dL (ref 6–23)
CO2: 28 mEq/L (ref 19–32)
Calcium: 9.9 mg/dL (ref 8.4–10.5)
Chloride: 104 mEq/L (ref 96–112)
Creatinine, Ser: 0.72 mg/dL (ref 0.40–1.20)
GFR: 101.87 mL/min (ref >60.00)
Glucose, Bld: 89 mg/dL (ref 70–99)
Potassium: 3.9 mEq/L (ref 3.5–5.1)
Sodium: 140 mEq/L (ref 135–145)

## 2019-02-06 LAB — LIPID PANEL
Cholesterol: 231 mg/dL — ABNORMAL HIGH (ref 0–200)
HDL: 68.6 mg/dL (ref >39.00)
LDL Cholesterol: 148 mg/dL — ABNORMAL HIGH (ref 0–99)
NonHDL: 162.53
Total CHOL/HDL Ratio: 3
Triglycerides: 74 mg/dL (ref 0.0–149.0)
VLDL: 14.8 mg/dL (ref 0.0–40.0)

## 2019-02-08 ENCOUNTER — Ambulatory Visit (INDEPENDENT_AMBULATORY_CARE_PROVIDER_SITE_OTHER): Payer: BC Managed Care – PPO | Admitting: Internal Medicine

## 2019-02-08 ENCOUNTER — Other Ambulatory Visit: Payer: Self-pay

## 2019-02-08 DIAGNOSIS — K219 Gastro-esophageal reflux disease without esophagitis: Secondary | ICD-10-CM | POA: Diagnosis not present

## 2019-02-08 DIAGNOSIS — Z9109 Other allergy status, other than to drugs and biological substances: Secondary | ICD-10-CM

## 2019-02-08 DIAGNOSIS — D649 Anemia, unspecified: Secondary | ICD-10-CM

## 2019-02-08 DIAGNOSIS — I10 Essential (primary) hypertension: Secondary | ICD-10-CM | POA: Diagnosis not present

## 2019-02-08 DIAGNOSIS — Z0000 Encounter for general adult medical examination without abnormal findings: Secondary | ICD-10-CM

## 2019-02-08 DIAGNOSIS — F439 Reaction to severe stress, unspecified: Secondary | ICD-10-CM

## 2019-02-08 DIAGNOSIS — D472 Monoclonal gammopathy: Secondary | ICD-10-CM

## 2019-02-08 DIAGNOSIS — E7800 Pure hypercholesterolemia, unspecified: Secondary | ICD-10-CM

## 2019-02-11 ENCOUNTER — Other Ambulatory Visit: Payer: Self-pay | Admitting: Family Medicine

## 2019-02-11 ENCOUNTER — Encounter: Payer: Self-pay | Admitting: Internal Medicine

## 2019-02-11 DIAGNOSIS — R39198 Other difficulties with micturition: Secondary | ICD-10-CM

## 2019-02-11 NOTE — Assessment & Plan Note (Signed)
Discussed with her today.  Feels she is handling things relatively well.  Follow.

## 2019-02-11 NOTE — Assessment & Plan Note (Signed)
Controlled.  

## 2019-02-11 NOTE — Assessment & Plan Note (Signed)
Controlled on current regimen.   

## 2019-02-11 NOTE — Assessment & Plan Note (Signed)
Followed by hematology 

## 2019-02-11 NOTE — Assessment & Plan Note (Signed)
Blood pressure has been under good control.  Continue current medication regimen.  Follow pressures.  Follow metabolic panel.  

## 2019-02-11 NOTE — Assessment & Plan Note (Signed)
Physical today 02/08/19.  Colonoscopy 01/2008.  Due f/u colonoscopy.  Mammogram 01/28/19 - Birads I.

## 2019-02-11 NOTE — Assessment & Plan Note (Signed)
Being followed by hematology.   

## 2019-02-11 NOTE — Progress Notes (Signed)
Patient ID: Kathleen Gould, female   DOB: 1966-06-23, 53 y.o.   MRN: [id number]   Subjective:    Patient ID: Kathleen Gould, female    DOB: 10-10-1965, 53 y.o.   MRN: [id number]  HPI  Patient here for a scheduled physical.  She reports increased stress.  Discussed with her today.  She feels she is handling things relatively well.  Trying to stay active.  No chest pain.  No sob.  No acid reflux.  No abdominal pain.  Bowels moving.  Previous urine issues.  Evaluated.  Did not take flomax.  Symptoms resolved.  No urinary symptoms now.  Discussed lab results.  Discussed diet and exercise.     Past Medical History:  Diagnosis Date  . Arthritis   . Endometriosis   . Environmental allergies   . GERD (gastroesophageal reflux disease)   . Hypercholesterolemia   . Hypertension   . Uterine fibroid    Past Surgical History:  Procedure Laterality Date  . ABDOMINAL HYSTERECTOMY    . CESAREAN SECTION     x2  . OOPHORECTOMY     S/P left secondary to cyst  . THYROGLOSSAL DUCT CYST N/A 05/27/2015   Procedure: THYROGLOSSAL DUCT CYST EXCISION ;  Surgeon: Clyde Canterbury, MD;  Location: [facility] ORS;  Service: ENT;  Laterality: N/A;   Family History  Problem Relation Age of Onset  . Heart attack Father 6  . Heart disease Father        bypass surgery  . Hypertension Father   . Hypercholesterolemia Father   . Kidney disease Father   . Diabetes Mother   . Diabetes Other        aunt  . Breast cancer Neg Hx    Social History   Socioeconomic History  . Marital status: Married    Spouse name: Not on file  . Number of children: 2  . Years of education: Not on file  . Highest education level: Not on file  Occupational History  . Not on file  Social Needs  . Financial resource strain: Not on file  . Food insecurity    Worry: Not on file    Inability: Not on file  . Transportation needs    Medical: Not on file    Non-medical: Not on file  Tobacco Use  . Smoking status: Never Smoker  .  Smokeless tobacco: Never Used  Substance and Sexual Activity  . Alcohol use: No    Alcohol/week: 0.0 standard drinks  . Drug use: No  . Sexual activity: Not Currently  Lifestyle  . Physical activity    Days per week: Not on file    Minutes per session: Not on file  . Stress: Not on file  Relationships  . Social Herbalist on phone: Not on file    Gets together: Not on file    Attends religious service: Not on file    Active member of club or organization: Not on file    Attends meetings of clubs or organizations: Not on file    Relationship status: Not on file  Other Topics Concern  . Not on file  Social History Narrative   Widowed     Outpatient Encounter Medications as of 02/08/2019  Medication Sig  . atorvastatin (LIPITOR) 20 MG tablet Take 1 tablet (20 mg total) by mouth daily.  . cetirizine (ZYRTEC) 10 MG tablet TAKE 1 TABLET BY MOUTH EVERY DAY  . felodipine (PLENDIL) 5 MG 24 hr  tablet TAKE 1 TABLET BY MOUTH EVERY DAY  . fluticasone (FLONASE) 50 MCG/ACT nasal spray Place 2 sprays into both nostrils daily.  . ondansetron (ZOFRAN-ODT) 4 MG disintegrating tablet Take 1 tablet (4 mg total) by mouth every 8 (eight) hours as needed for nausea or vomiting.  . pantoprazole (PROTONIX) 40 MG tablet TAKE 1 TABLET BY MOUTH EVERY DAY  . tamsulosin (FLOMAX) 0.4 MG CAPS capsule TAKE 1 CAPSULE BY MOUTH EVERY DAY  . triamterene-hydrochlorothiazide (MAXZIDE-25) 37.5-25 MG tablet TAKE 0.5 TABLETS BY MOUTH DAILY.   No facility-administered encounter medications on file as of 02/08/2019.     Review of Systems  Constitutional: Negative for appetite change and unexpected weight change.  HENT: Negative for congestion and sinus pressure.   Eyes: Negative for pain and visual disturbance.  Respiratory: Negative for cough, chest tightness and shortness of breath.   Cardiovascular: Negative for chest pain, palpitations and leg swelling.  Gastrointestinal: Negative for abdominal pain,  diarrhea, nausea and vomiting.  Genitourinary: Negative for difficulty urinating and dysuria.  Musculoskeletal: Negative for joint swelling and myalgias.  Skin: Negative for color change and rash.  Neurological: Negative for dizziness, light-headedness and headaches.  Hematological: Negative for adenopathy. Does not bruise/bleed easily.  Psychiatric/Behavioral: Negative for agitation and dysphoric mood.       Objective:    Physical Exam Constitutional:      General: She is not in acute distress.    Appearance: Normal appearance. She is well-developed.  HENT:     Right Ear: External ear normal.     Left Ear: External ear normal.  Eyes:     General: No scleral icterus.       Right eye: No discharge.        Left eye: No discharge.     Conjunctiva/sclera: Conjunctivae normal.  Neck:     Musculoskeletal: Neck supple. No muscular tenderness.     Thyroid: No thyromegaly.  Cardiovascular:     Rate and Rhythm: Normal rate and regular rhythm.  Pulmonary:     Effort: No tachypnea, accessory muscle usage or respiratory distress.     Breath sounds: Normal breath sounds. No decreased breath sounds or wheezing.  Chest:     Breasts:        Right: No inverted nipple, mass, nipple discharge or tenderness (no axillary adenopathy).        Left: No inverted nipple, mass, nipple discharge or tenderness (no axilarry adenopathy).  Abdominal:     General: Bowel sounds are normal.     Palpations: Abdomen is soft.     Tenderness: There is no abdominal tenderness.  Musculoskeletal:        General: No swelling or tenderness.  Lymphadenopathy:     Cervical: No cervical adenopathy.  Skin:    Findings: No erythema or rash.  Neurological:     Mental Status: She is alert and oriented to person, place, and time.  Psychiatric:        Mood and Affect: Mood normal.        Behavior: Behavior normal.     BP 124/76   Pulse 86   Temp 99 F (37.2 C) (Oral)   Resp 16   Ht 5\' 5"  (1.651 m)   Wt 224 lb  3.2 oz (101.7 kg)   LMP 11/06/2000   SpO2 97%   BMI 37.31 kg/m  Wt Readings from Last 3 Encounters:  02/08/19 224 lb 3.2 oz (101.7 kg)  11/02/18 223 lb (101.2 kg)  10/25/18 224 lb  3.2 oz (101.7 kg)     Lab Results  Component Value Date   WBC 6.5 12/05/2018   HGB 13.6 12/05/2018   HCT 40.7 12/05/2018   PLT 428 (H) 12/05/2018   GLUCOSE 89 02/06/2019   CHOL 231 (H) 02/06/2019   TRIG 74.0 02/06/2019   HDL 68.60 02/06/2019   LDLDIRECT 164.5 07/24/2013   LDLCALC 148 (H) 02/06/2019   ALT 11 02/06/2019   AST 13 02/06/2019   NA 140 02/06/2019   K 3.9 02/06/2019   CL 104 02/06/2019   CREATININE 0.72 02/06/2019   BUN 11 02/06/2019   CO2 28 02/06/2019   TSH 1.97 03/20/2018   INR 1.0 04/21/2014    Mm 3d Screen Breast Bilateral  Result Date: 01/28/2019 CLINICAL DATA:  Screening. EXAM: DIGITAL SCREENING BILATERAL MAMMOGRAM WITH TOMO AND CAD COMPARISON:  Previous exam(s). ACR Breast Density Category b: There are scattered areas of fibroglandular density. FINDINGS: There are no findings suspicious for malignancy. Images were processed with CAD. IMPRESSION: No mammographic evidence of malignancy. A result letter of this screening mammogram will be mailed directly to the patient. RECOMMENDATION: Screening mammogram in one year. (Code:SM-B-01Y) BI-RADS CATEGORY  1: Negative. Electronically Signed   By: Lillia Mountain M.D.   On: 01/28/2019 12:49       Assessment & Plan:   Problem List Items Addressed This Visit    Anemia    Being followed by hematology.       Environmental allergies    Controlled.        Esophageal reflux    Controlled on current regimen.        Essential hypertension, benign    Blood pressure has been under good control.  Continue current medication regimen.  Follow pressures.  Follow metabolic panel.       Relevant Orders   Basic metabolic panel   TSH   Health care maintenance    Physical today 02/08/19.  Colonoscopy 01/2008.  Due f/u colonoscopy.  Mammogram  01/28/19 - Birads I.        MGUS (monoclonal gammopathy of unknown significance)    Followed by hematology.        Relevant Orders   CBC with Differential/Platelet   Pure hypercholesterolemia    Taking lipitor.  Low cholesterol diet and exercise.  Follow lipid panel and liver function tests.        Relevant Orders   Hepatic function panel   Lipid panel   Stress    Discussed with her today.  Feels she is handling things relatively well.  Follow.            Einar Pheasant, MD

## 2019-02-11 NOTE — Assessment & Plan Note (Signed)
Taking lipitor.  Low cholesterol diet and exercise.  Follow lipid panel and liver function tests.   

## 2019-02-23 ENCOUNTER — Other Ambulatory Visit: Payer: Self-pay | Admitting: Internal Medicine

## 2019-02-23 DIAGNOSIS — K219 Gastro-esophageal reflux disease without esophagitis: Secondary | ICD-10-CM

## 2019-02-25 ENCOUNTER — Telehealth: Payer: Self-pay | Admitting: Internal Medicine

## 2019-02-25 DIAGNOSIS — K219 Gastro-esophageal reflux disease without esophagitis: Secondary | ICD-10-CM

## 2019-02-25 MED ORDER — PANTOPRAZOLE SODIUM 40 MG PO TBEC: 40.0000 mg | DELAYED_RELEASE_TABLET | Freq: Every day | ORAL | 1 refills | Status: DC

## 2019-02-25 NOTE — Telephone Encounter (Signed)
REFILL pantoprazole (PROTONIX) 40 MG tablet  PHARM[facility]Y [id number] - [place], [place] - [address] [phone] (Phone) [phone] (Fax)

## 2019-02-25 NOTE — Telephone Encounter (Signed)
Called and spoke to patient concerning the refill for Protonix.  Patient is aware that PCP is not the original prescribing MD for this medication.  Patient said that she contacted Dr. Zoila Shutter office, the prescribing MD for this med and was told that Rx will not be refilled without a visit.  Patient said that she cannot afford to pay $80 for a phone visit so she wanted to know if Dr. Nicki Reaper will send a refill for the Protonix Rx or prescribe something else for reflux.

## 2019-02-25 NOTE — Telephone Encounter (Signed)
rx sent in for protonix.  Please notify pt.

## 2019-02-26 NOTE — Telephone Encounter (Signed)
Pt aware.

## 2019-03-14 ENCOUNTER — Encounter: Payer: Self-pay | Admitting: Internal Medicine

## 2019-03-17 NOTE — Telephone Encounter (Signed)
Please call pt and let her know when she can drop off form.  Can look at form and see what is needed.

## 2019-03-18 NOTE — Telephone Encounter (Signed)
Pt dropped off form  Placed it in Dr. Bary Leriche color folder upfront

## 2019-03-18 NOTE — Telephone Encounter (Signed)
Form placed in folder for review.

## 2019-03-18 NOTE — Telephone Encounter (Signed)
Form signed and placed in box.   

## 2019-03-31 ENCOUNTER — Other Ambulatory Visit: Payer: Self-pay | Admitting: Internal Medicine

## 2019-05-07 ENCOUNTER — Other Ambulatory Visit: Payer: Self-pay | Admitting: Internal Medicine

## 2019-05-20 ENCOUNTER — Other Ambulatory Visit: Payer: Self-pay | Admitting: Internal Medicine

## 2019-06-06 ENCOUNTER — Other Ambulatory Visit: Payer: Self-pay

## 2019-06-06 DIAGNOSIS — Z20822 Contact with and (suspected) exposure to covid-19: Secondary | ICD-10-CM

## 2019-06-07 LAB — NOVEL CORONAVIRUS, NAA: SARS-CoV-2, NAA: NOT DETECTED

## 2019-06-19 ENCOUNTER — Other Ambulatory Visit: Payer: Self-pay

## 2019-06-19 ENCOUNTER — Other Ambulatory Visit: Payer: BC Managed Care – PPO

## 2019-06-21 ENCOUNTER — Other Ambulatory Visit: Payer: Self-pay

## 2019-06-21 ENCOUNTER — Ambulatory Visit (INDEPENDENT_AMBULATORY_CARE_PROVIDER_SITE_OTHER): Payer: BC Managed Care – PPO | Admitting: Internal Medicine

## 2019-06-21 DIAGNOSIS — D472 Monoclonal gammopathy: Secondary | ICD-10-CM

## 2019-06-21 DIAGNOSIS — Z9109 Other allergy status, other than to drugs and biological substances: Secondary | ICD-10-CM

## 2019-06-21 DIAGNOSIS — F439 Reaction to severe stress, unspecified: Secondary | ICD-10-CM

## 2019-06-21 DIAGNOSIS — I10 Essential (primary) hypertension: Secondary | ICD-10-CM | POA: Diagnosis not present

## 2019-06-21 DIAGNOSIS — R911 Solitary pulmonary nodule: Secondary | ICD-10-CM

## 2019-06-21 DIAGNOSIS — E7800 Pure hypercholesterolemia, unspecified: Secondary | ICD-10-CM

## 2019-06-21 DIAGNOSIS — K219 Gastro-esophageal reflux disease without esophagitis: Secondary | ICD-10-CM

## 2019-06-21 DIAGNOSIS — D649 Anemia, unspecified: Secondary | ICD-10-CM | POA: Diagnosis not present

## 2019-06-21 NOTE — Progress Notes (Signed)
Patient ID: Kathleen Gould, female   DOB: 08-Mar-1966, 53 y.o.   MRN: [id number]   Virtual Visit via telephone Note  This visit type was conducted due to national recommendations for restrictions regarding the COVID-19 pandemic (e.g. social distancing).  This format is felt to be most appropriate for this patient at this time.  All issues noted in this document were discussed and addressed.  No physical exam was performed (except for noted visual exam findings with Video Visits).   I connected with Beola Levesque by telephone and verified that I am speaking with the correct person using two identifiers. Location patient: home Location provider: work  Persons participating in the telephone visit: patient, provider  I discussed the limitations, risks, security and privacy concerns of performing an evaluation and management service by telephone and the availability of in person appointments.  The patient expressed understanding and agreed to proceed.   Reason for visit: scheduled follow up.   HPI: Has noticed some headache.  Felt related to allergies and sinus congestion. Using flonase, mucinex and robitussin.  Feels controlled. Does not feel needs any further intervention.  Stays active.  No chest pain.  No sob.  Minimal acid reflux.  No abdominal pain. Bowels moving.  Did get her flu shot.  Handling stress.   ROS: See pertinent positives and negatives per HPI.  Past Medical History:  Diagnosis Date  . Arthritis   . Endometriosis   . Environmental allergies   . GERD (gastroesophageal reflux disease)   . Hypercholesterolemia   . Hypertension   . Uterine fibroid     Past Surgical History:  Procedure Laterality Date  . ABDOMINAL HYSTERECTOMY    . CESAREAN SECTION     x2  . OOPHORECTOMY     S/P left secondary to cyst  . THYROGLOSSAL DUCT CYST N/A 05/27/2015   Procedure: THYROGLOSSAL DUCT CYST EXCISION ;  Surgeon: Clyde Canterbury, MD;  Location: [facility];  Service: ENT;   Laterality: N/A;    Family History  Problem Relation Age of Onset  . Heart attack Father 15  . Heart disease Father        bypass surgery  . Hypertension Father   . Hypercholesterolemia Father   . Kidney disease Father   . Diabetes Mother   . Diabetes Other        aunt  . Breast cancer Neg Hx     SOCIAL HX: reviewed.    Current Outpatient Medications:  .  atorvastatin (LIPITOR) 20 MG tablet, TAKE 1 TABLET BY MOUTH EVERY DAY, Disp: 90 tablet, Rfl: 3 .  cetirizine (ZYRTEC) 10 MG tablet, TAKE 1 TABLET BY MOUTH EVERY DAY, Disp: 30 tablet, Rfl: 0 .  felodipine (PLENDIL) 5 MG 24 hr tablet, TAKE 1 TABLET BY MOUTH EVERY DAY, Disp: 90 tablet, Rfl: 1 .  fluticasone (FLONASE) 50 MCG/ACT nasal spray, Place 2 sprays into both nostrils daily., Disp: 16 g, Rfl: 5 .  ondansetron (ZOFRAN-ODT) 4 MG disintegrating tablet, Take 1 tablet (4 mg total) by mouth every 8 (eight) hours as needed for nausea or vomiting., Disp: 20 tablet, Rfl: 0 .  pantoprazole (PROTONIX) 40 MG tablet, Take 1 tablet (40 mg total) by mouth daily., Disp: 90 tablet, Rfl: 1 .  tamsulosin (FLOMAX) 0.4 MG CAPS capsule, TAKE 1 CAPSULE BY MOUTH EVERY DAY, Disp: 30 capsule, Rfl: 0 .  triamterene-hydrochlorothiazide (MAXZIDE-25) 37.5-25 MG tablet, TAKE 0.5 TABLETS BY MOUTH DAILY., Disp: 30 tablet, Rfl: 5  EXAM:  VITALS per patient if  applicable:  [id number]  GENERAL: alert.  Appears to be in no acute distress.  Answering questions appropriately.    PSYCH/NEURO: pleasant and cooperative, no obvious depression or anxiety, speech and thought processing grossly intact  ASSESSMENT AND PLAN:  Discussed the following assessment and plan:  Anemia Followed by hematology.    Environmental allergies Overall controlled.  Follow.    Esophageal reflux Noticing acid reflux.  Start protonix.  Follow.    Essential hypertension, benign Blood pressure under good control.  Continue same medication regimen.  Follow pressures.  Follow  metabolic panel.    Lung nodule, solitary Seeing hematology.  CT chest denied by insurance.  Had cxr as outlined. Hematology following.   MGUS (monoclonal gammopathy of unknown significance) Followed by hematology.    Pure hypercholesterolemia On lipitor.  Low cholesterol diet and exercise.  Follow lipid panel and liver function tests.    Stress Discussed with her today.  Overall she feels she is doing relatively well.  Follow.      I discussed the assessment and treatment plan with the patient. The patient was provided an opportunity to ask questions and all were answered. The patient agreed with the plan and demonstrated an understanding of the instructions.   The patient was advised to call back or seek an in-person evaluation if the symptoms worsen or if the condition fails to improve as anticipated.  I provided 21 minutes of non-face-to-face time during this encounter.   Einar Pheasant, MD

## 2019-06-29 ENCOUNTER — Encounter: Payer: Self-pay | Admitting: Internal Medicine

## 2019-06-29 NOTE — Assessment & Plan Note (Signed)
Overall controlled.  Follow.

## 2019-06-29 NOTE — Assessment & Plan Note (Signed)
Blood pressure under good control.  Continue same medication regimen.  Follow pressures.  Follow metabolic panel.   

## 2019-06-29 NOTE — Assessment & Plan Note (Signed)
Followed by hematology 

## 2019-06-29 NOTE — Assessment & Plan Note (Signed)
On lipitor.  Low cholesterol diet and exercise.  Follow lipid panel and liver function tests.   

## 2019-06-29 NOTE — Assessment & Plan Note (Signed)
Noticing acid reflux.  Start protonix.  Follow.

## 2019-06-29 NOTE — Assessment & Plan Note (Signed)
Seeing hematology.  CT chest denied by insurance.  Had cxr as outlined. Hematology following.

## 2019-06-29 NOTE — Assessment & Plan Note (Signed)
Discussed with her today.  Overall she feels she is doing relatively well.  Follow.

## 2019-07-10 ENCOUNTER — Other Ambulatory Visit (INDEPENDENT_AMBULATORY_CARE_PROVIDER_SITE_OTHER): Payer: BC Managed Care – PPO

## 2019-07-10 ENCOUNTER — Other Ambulatory Visit: Payer: Self-pay

## 2019-07-10 DIAGNOSIS — E7800 Pure hypercholesterolemia, unspecified: Secondary | ICD-10-CM | POA: Diagnosis not present

## 2019-07-10 DIAGNOSIS — D472 Monoclonal gammopathy: Secondary | ICD-10-CM

## 2019-07-10 DIAGNOSIS — I10 Essential (primary) hypertension: Secondary | ICD-10-CM | POA: Diagnosis not present

## 2019-07-10 LAB — CBC WITH DIFFERENTIAL/PLATELET
Basophils Absolute: 0 10*3/uL (ref 0.0–0.1)
Basophils Relative: 0.7 % (ref 0.0–3.0)
Eosinophils Absolute: 0.1 10*3/uL (ref 0.0–0.7)
Eosinophils Relative: 2.9 % (ref 0.0–5.0)
HCT: 39.8 % (ref 36.0–46.0)
Hemoglobin: 13.2 g/dL (ref 12.0–15.0)
Lymphocytes Relative: 36 % (ref 12.0–46.0)
Lymphs Abs: 1.6 10*3/uL (ref 0.7–4.0)
MCHC: 33.3 g/dL (ref 30.0–36.0)
MCV: 85.2 fl (ref 78.0–100.0)
Monocytes Absolute: 0.3 10*3/uL (ref 0.1–1.0)
Monocytes Relative: 6.3 % (ref 3.0–12.0)
Neutro Abs: 2.4 10*3/uL (ref 1.4–7.7)
Neutrophils Relative %: 54.1 % (ref 43.0–77.0)
Platelets: 445 10*3/uL — ABNORMAL HIGH (ref 150.0–400.0)
RBC: 4.67 Mil/uL (ref 3.87–5.11)
RDW: 14.2 % (ref 11.5–15.5)
WBC: 4.4 10*3/uL (ref 4.0–10.5)

## 2019-07-10 LAB — HEPATIC FUNCTION PANEL
ALT: 10 U/L (ref 0–35)
AST: 11 U/L (ref 0–37)
Albumin: 4.1 g/dL (ref 3.5–5.2)
Alkaline Phosphatase: 76 U/L (ref 39–117)
Bilirubin, Direct: 0.1 mg/dL (ref 0.0–0.3)
Total Bilirubin: 0.5 mg/dL (ref 0.2–1.2)
Total Protein: 7.5 g/dL (ref 6.0–8.3)

## 2019-07-10 LAB — BASIC METABOLIC PANEL
BUN: 8 mg/dL (ref 6–23)
CO2: 30 mEq/L (ref 19–32)
Calcium: 9.4 mg/dL (ref 8.4–10.5)
Chloride: 102 mEq/L (ref 96–112)
Creatinine, Ser: 0.72 mg/dL (ref 0.40–1.20)
GFR: 101.71 mL/min (ref >60.00)
Glucose, Bld: 96 mg/dL (ref 70–99)
Potassium: 3.6 mEq/L (ref 3.5–5.1)
Sodium: 139 mEq/L (ref 135–145)

## 2019-07-10 LAB — LIPID PANEL
Cholesterol: 210 mg/dL — ABNORMAL HIGH (ref 0–200)
HDL: 62.1 mg/dL (ref >39.00)
LDL Cholesterol: 131 mg/dL — ABNORMAL HIGH (ref 0–99)
NonHDL: 147.52
Total CHOL/HDL Ratio: 3
Triglycerides: 83 mg/dL (ref 0.0–149.0)
VLDL: 16.6 mg/dL (ref 0.0–40.0)

## 2019-07-10 LAB — TSH: TSH: 1.55 u[IU]/mL (ref 0.35–4.50)

## 2019-08-27 ENCOUNTER — Other Ambulatory Visit: Payer: Self-pay | Admitting: Internal Medicine

## 2019-08-27 DIAGNOSIS — K219 Gastro-esophageal reflux disease without esophagitis: Secondary | ICD-10-CM

## 2019-10-28 ENCOUNTER — Other Ambulatory Visit: Payer: Self-pay | Admitting: Internal Medicine

## 2019-10-31 ENCOUNTER — Ambulatory Visit: Payer: BC Managed Care – PPO | Admitting: Internal Medicine

## 2019-10-31 ENCOUNTER — Encounter: Payer: Self-pay | Admitting: Internal Medicine

## 2019-10-31 ENCOUNTER — Other Ambulatory Visit: Payer: Self-pay

## 2019-10-31 DIAGNOSIS — R911 Solitary pulmonary nodule: Secondary | ICD-10-CM

## 2019-10-31 DIAGNOSIS — F439 Reaction to severe stress, unspecified: Secondary | ICD-10-CM

## 2019-10-31 DIAGNOSIS — D472 Monoclonal gammopathy: Secondary | ICD-10-CM

## 2019-10-31 DIAGNOSIS — K219 Gastro-esophageal reflux disease without esophagitis: Secondary | ICD-10-CM

## 2019-10-31 DIAGNOSIS — I10 Essential (primary) hypertension: Secondary | ICD-10-CM | POA: Diagnosis not present

## 2019-10-31 DIAGNOSIS — R221 Localized swelling, mass and lump, neck: Secondary | ICD-10-CM

## 2019-10-31 DIAGNOSIS — E7800 Pure hypercholesterolemia, unspecified: Secondary | ICD-10-CM

## 2019-10-31 DIAGNOSIS — D649 Anemia, unspecified: Secondary | ICD-10-CM

## 2019-10-31 NOTE — Progress Notes (Signed)
Patient ID: Kathleen Gould, female   DOB: 1966/02/06, 54 y.o.   MRN: [id number]   Subjective:    Patient ID: Kathleen Gould, female    DOB: 02/15/1966, 54 y.o.   MRN: [id number]  HPI  Patient here for a scheduled follow up.  She reports she is doing relatively well.  Teaching.  Handling stress.  Does not feel needs any further intervention.  Trying to stay active.  No chest pain or sob reported.  No abdominal pain or bowel change reported.  Has noticed some change - right ear.  No pain.  No increased sinus congestion or allergies.  Overall feels things are stable.     Past Medical History:  Diagnosis Date  . Arthritis   . Endometriosis   . Environmental allergies   . GERD (gastroesophageal reflux disease)   . Hypercholesterolemia   . Hypertension   . Uterine fibroid    Past Surgical History:  Procedure Laterality Date  . ABDOMINAL HYSTERECTOMY    . CESAREAN SECTION     x2  . OOPHORECTOMY     S/P left secondary to cyst  . THYROGLOSSAL DUCT CYST N/A 05/27/2015   Procedure: THYROGLOSSAL DUCT CYST EXCISION ;  Surgeon: Clyde Canterbury, MD;  Location: [facility] OR[facility];  Service: ENT;  Laterality: N/A;   Family History  Problem Relation Age of Onset  . Heart attack Father 29  . Heart disease Father        bypass surgery  . Hypertension Father   . Hypercholesterolemia Father   . Kidney disease Father   . Diabetes Mother   . Diabetes Other        aunt  . Breast cancer Neg Hx    Social History   Socioeconomic History  . Marital status: Married    Spouse name: Not on file  . Number of children: 2  . Years of education: Not on file  . Highest education level: Not on file  Occupational History  . Not on file  Tobacco Use  . Smoking status: Never Smoker  . Smokeless tobacco: Never Used  Substance and Sexual Activity  . Alcohol use: No    Alcohol/week: 0.0 standard drinks  . Drug use: No  . Sexual activity: Not Currently  Other Topics Concern  . Not on file  Social History  Narrative   Widowed    Social Determinants of Health   Financial Resource Strain:   . Difficulty of Paying Living Expenses:   Food Insecurity:   . Worried About Charity fundraiser in the Last Year:   . Arboriculturist in the Last Year:   Transportation Needs:   . Film/video editor (Medical):   Marland Kitchen Lack of Transportation (Non-Medical):   Physical Activity:   . Days of Exercise per Week:   . Minutes of Exercise per Session:   Stress:   . Feeling of Stress :   Social Connections:   . Frequency of Communication with Friends and Family:   . Frequency of Social Gatherings with Friends and Family:   . Attends Religious Services:   . Active Member of Clubs or Organizations:   . Attends Archivist Meetings:   Marland Kitchen Marital Status:     Outpatient Encounter Medications as of 10/31/2019  Medication Sig  . atorvastatin (LIPITOR) 20 MG tablet TAKE 1 TABLET BY MOUTH EVERY DAY  . cetirizine (ZYRTEC) 10 MG tablet TAKE 1 TABLET BY MOUTH EVERY DAY  . felodipine (PLENDIL) 5 MG  24 hr tablet TAKE 1 TABLET BY MOUTH EVERY DAY  . fluticasone (FLONASE) 50 MCG/ACT nasal spray Place 2 sprays into both nostrils daily.  . pantoprazole (PROTONIX) 40 MG tablet TAKE 1 TABLET BY MOUTH EVERY DAY  . triamterene-hydrochlorothiazide (MAXZIDE-25) 37.5-25 MG tablet TAKE 0.5 TABLETS BY MOUTH DAILY.  . [DISCONTINUED] ondansetron (ZOFRAN-ODT) 4 MG disintegrating tablet Take 1 tablet (4 mg total) by mouth every 8 (eight) hours as needed for nausea or vomiting. (Patient not taking: Reported on 10/31/2019)  . [DISCONTINUED] tamsulosin (FLOMAX) 0.4 MG CAPS capsule TAKE 1 CAPSULE BY MOUTH EVERY DAY (Patient not taking: Reported on 10/31/2019)   No facility-administered encounter medications on file as of 10/31/2019.    Review of Systems  Constitutional: Negative for appetite change and unexpected weight change.  HENT: Negative for congestion and sinus pressure.   Respiratory: Negative for cough, chest tightness and  shortness of breath.   Cardiovascular: Negative for chest pain, palpitations and leg swelling.  Gastrointestinal: Negative for abdominal pain, diarrhea, nausea and vomiting.  Genitourinary: Negative for difficulty urinating and dysuria.  Musculoskeletal: Negative for joint swelling and myalgias.  Skin: Negative for color change and rash.  Neurological: Negative for dizziness, light-headedness and headaches.  Psychiatric/Behavioral: Negative for agitation and dysphoric mood.       Objective:    Physical Exam Constitutional:      General: She is not in acute distress.    Appearance: Normal appearance.  HENT:     Head: Normocephalic and atraumatic.     Right Ear: External ear normal.     Left Ear: External ear normal.  Eyes:     General: No scleral icterus.       Right eye: No discharge.        Left eye: No discharge.     Conjunctiva/sclera: Conjunctivae normal.  Neck:     Thyroid: No thyromegaly.     Comments: Right neck nodule Cardiovascular:     Rate and Rhythm: Normal rate and regular rhythm.  Pulmonary:     Effort: No respiratory distress.     Breath sounds: Normal breath sounds. No wheezing.  Abdominal:     General: Bowel sounds are normal.     Palpations: Abdomen is soft.     Tenderness: There is no abdominal tenderness.  Musculoskeletal:        General: No swelling or tenderness.     Cervical back: Neck supple. No tenderness.  Skin:    Findings: No erythema or rash.  Neurological:     Mental Status: She is alert and oriented to person, place, and time.  Psychiatric:        Mood and Affect: Mood normal.        Behavior: Behavior normal.     BP 122/86   Pulse 76   Temp (!) 96.5 F (35.8 C) (Temporal)   Ht 5\' 5"  (1.651 m)   Wt 238 lb 12.8 oz (108.3 kg)   LMP 11/06/2000   SpO2 99%   BMI 39.74 kg/m  Wt Readings from Last 3 Encounters:  10/31/19 238 lb 12.8 oz (108.3 kg)  02/08/19 224 lb 3.2 oz (101.7 kg)  11/02/18 223 lb (101.2 kg)     Lab Results    Component Value Date   WBC 4.4 07/10/2019   HGB 13.2 07/10/2019   HCT 39.8 07/10/2019   PLT 445.0 (H) 07/10/2019   GLUCOSE 96 07/10/2019   CHOL 210 (H) 07/10/2019   TRIG 83.0 07/10/2019   HDL 62.10 07/10/2019  LDLDIRECT 164.5 07/24/2013   LDLCALC 131 (H) 07/10/2019   ALT 10 07/10/2019   AST 11 07/10/2019   NA 139 07/10/2019   K 3.6 07/10/2019   CL 102 07/10/2019   CREATININE 0.72 07/10/2019   BUN 8 07/10/2019   CO2 30 07/10/2019   TSH 1.55 07/10/2019   INR 1.0 04/21/2014    MM 3D SCREEN BREAST BILATERAL  Result Date: 01/28/2019 CLINICAL DATA:  Screening. EXAM: DIGITAL SCREENING BILATERAL MAMMOGRAM WITH TOMO AND CAD COMPARISON:  Previous exam(s). ACR Breast Density Category b: There are scattered areas of fibroglandular density. FINDINGS: There are no findings suspicious for malignancy. Images were processed with CAD. IMPRESSION: No mammographic evidence of malignancy. A result letter of this screening mammogram will be mailed directly to the patient. RECOMMENDATION: Screening mammogram in one year. (Code:SM-B-01Y) BI-RADS CATEGORY  1: Negative. Electronically Signed   By: Lillia Mountain M.D.   On: 01/28/2019 12:49       Assessment & Plan:   Problem List Items Addressed This Visit    Anemia    Followed by hematology.  Stable.       Relevant Orders   CBC with Differential/Platelet   Esophageal reflux    Acid reflux controlled on protonix.  Follow.        Essential hypertension, benign    Blood pressure under good control.  Continue same medication regimen - on plendil and triam/hctz. Follow pressures.  Follow metabolic panel.         Relevant Orders   Basic metabolic panel   Lung nodule, solitary    Seeing hematology.  CT chest - denied by insurance.  Had cxr.  Being followed by hematology.        MGUS (monoclonal gammopathy of unknown significance)    Followed by hematology.  Stable.        Neck nodule    Right neck nodule.  Have ENT evaluate.        Relevant  Orders   Ambulatory referral to ENT   Pure hypercholesterolemia    On lipitor.  Low cholesterol diet and exercise.  Follow lipid panel and liver function tests.        Relevant Orders   Hepatic function panel   Lipid panel   Stress    Increased stress.  Discussed with her today.  Overall handling things relatively well.  Follow.            Einar Pheasant, MD

## 2019-11-09 ENCOUNTER — Encounter: Payer: Self-pay | Admitting: Internal Medicine

## 2019-11-09 DIAGNOSIS — R221 Localized swelling, mass and lump, neck: Secondary | ICD-10-CM | POA: Insufficient documentation

## 2019-11-09 NOTE — Assessment & Plan Note (Signed)
Blood pressure under good control.  Continue same medication regimen - on plendil and triam/hctz. Follow pressures.  Follow metabolic panel.

## 2019-11-09 NOTE — Assessment & Plan Note (Signed)
Acid reflux controlled on protonix.  Follow.

## 2019-11-09 NOTE — Assessment & Plan Note (Signed)
Followed by hematology. Stable.  

## 2019-11-09 NOTE — Assessment & Plan Note (Signed)
On lipitor.  Low cholesterol diet and exercise.  Follow lipid panel and liver function tests.   

## 2019-11-09 NOTE — Assessment & Plan Note (Signed)
Increased stress.  Discussed with her today.  Overall handling things relatively well.  Follow.

## 2019-11-09 NOTE — Assessment & Plan Note (Signed)
Right neck nodule.  Have ENT evaluate.

## 2019-11-09 NOTE — Assessment & Plan Note (Signed)
Seeing hematology.  CT chest - denied by insurance.  Had cxr.  Being followed by hematology.

## 2019-12-03 ENCOUNTER — Other Ambulatory Visit (INDEPENDENT_AMBULATORY_CARE_PROVIDER_SITE_OTHER): Payer: BC Managed Care – PPO

## 2019-12-03 ENCOUNTER — Other Ambulatory Visit: Payer: Self-pay

## 2019-12-03 DIAGNOSIS — D649 Anemia, unspecified: Secondary | ICD-10-CM

## 2019-12-03 DIAGNOSIS — E7800 Pure hypercholesterolemia, unspecified: Secondary | ICD-10-CM | POA: Diagnosis not present

## 2019-12-03 DIAGNOSIS — I10 Essential (primary) hypertension: Secondary | ICD-10-CM | POA: Diagnosis not present

## 2019-12-03 LAB — CBC WITH DIFFERENTIAL/PLATELET
Basophils Absolute: 0.1 10*3/uL (ref 0.0–0.1)
Basophils Relative: 1.2 % (ref 0.0–3.0)
Eosinophils Absolute: 0.3 10*3/uL (ref 0.0–0.7)
Eosinophils Relative: 5.1 % — ABNORMAL HIGH (ref 0.0–5.0)
HCT: 39.1 % (ref 36.0–46.0)
Hemoglobin: 13 g/dL (ref 12.0–15.0)
Lymphocytes Relative: 32.7 % (ref 12.0–46.0)
Lymphs Abs: 1.6 10*3/uL (ref 0.7–4.0)
MCHC: 33.3 g/dL (ref 30.0–36.0)
MCV: 85.3 fl (ref 78.0–100.0)
Monocytes Absolute: 0.4 10*3/uL (ref 0.1–1.0)
Monocytes Relative: 7.8 % (ref 3.0–12.0)
Neutro Abs: 2.6 10*3/uL (ref 1.4–7.7)
Neutrophils Relative %: 53.2 % (ref 43.0–77.0)
Platelets: 460 10*3/uL — ABNORMAL HIGH (ref 150.0–400.0)
RBC: 4.59 Mil/uL (ref 3.87–5.11)
RDW: 14.7 % (ref 11.5–15.5)
WBC: 4.9 10*3/uL (ref 4.0–10.5)

## 2019-12-03 LAB — LIPID PANEL
Cholesterol: 206 mg/dL — ABNORMAL HIGH (ref 0–200)
HDL: 70.8 mg/dL (ref >39.00)
LDL Cholesterol: 116 mg/dL — ABNORMAL HIGH (ref 0–99)
NonHDL: 135.04
Total CHOL/HDL Ratio: 3
Triglycerides: 95 mg/dL (ref 0.0–149.0)
VLDL: 19 mg/dL (ref 0.0–40.0)

## 2019-12-03 LAB — HEPATIC FUNCTION PANEL
ALT: 13 U/L (ref 0–35)
AST: 14 U/L (ref 0–37)
Albumin: 4.2 g/dL (ref 3.5–5.2)
Alkaline Phosphatase: 79 U/L (ref 39–117)
Bilirubin, Direct: 0.1 mg/dL (ref 0.0–0.3)
Total Bilirubin: 0.7 mg/dL (ref 0.2–1.2)
Total Protein: 7.8 g/dL (ref 6.0–8.3)

## 2019-12-03 LAB — BASIC METABOLIC PANEL
BUN: 9 mg/dL (ref 6–23)
CO2: 31 mEq/L (ref 19–32)
Calcium: 9.9 mg/dL (ref 8.4–10.5)
Chloride: 103 mEq/L (ref 96–112)
Creatinine, Ser: 0.78 mg/dL (ref 0.40–1.20)
GFR: 92.6 mL/min (ref >60.00)
Glucose, Bld: 92 mg/dL (ref 70–99)
Potassium: 3.8 mEq/L (ref 3.5–5.1)
Sodium: 140 mEq/L (ref 135–145)

## 2019-12-04 ENCOUNTER — Encounter: Payer: Self-pay | Admitting: Internal Medicine

## 2019-12-31 ENCOUNTER — Telehealth: Payer: Self-pay | Admitting: Internal Medicine

## 2019-12-31 DIAGNOSIS — D472 Monoclonal gammopathy: Secondary | ICD-10-CM

## 2019-12-31 NOTE — Telephone Encounter (Signed)
Pt called requesting a referral be sent to [facility] in [place]

## 2020-01-01 NOTE — Telephone Encounter (Signed)
LMTCB

## 2020-01-01 NOTE — Addendum Note (Signed)
Addended by: Alisa Graff on: 01/01/2020 09:14 PM   Modules accepted: Orders

## 2020-01-01 NOTE — Telephone Encounter (Signed)
Order placed for hematology referral.  

## 2020-01-01 NOTE — Telephone Encounter (Signed)
Pt already an est pt with oncology but due to not being since in awhile, needs new referral placed.

## 2020-01-06 NOTE — Progress Notes (Signed)
[facility]  [address] [place], [place] [address] Phone: [phone]  Fax: [phone]   Clinic Day:  01/08/2020  Referring physician: Einar Pheasant, MD  Chief Complaint: Kathleen Gould is a 54 y.o. female with a monoclonal gammopathy of unknown significance (MGUS) who is seen for a new patient assessment.   HPI: The patient was diagnosed with a monoclonal gammopathy in 02/2014 after presenting with joint aches.  SPEP on 03/03/2014 showed a M spike of 0.44 gm/dL. Work up on 04/21/2014 showed an M spike of 0.5 gm/dL monoclonal protein with kappa light chain specificity.  CBC, creatinine, LDH, and flow cytometry were normal.  Bone survey on 04/21/2014 revealed no blastic or lytic lesions.   She was initially followed by Dr Ma Hillock.  On 05/13/2015, she noted a left upper neck mass which intermittently enlarged and became painful. She denied any fevers or night sweats.  She denied any bone pain. Appetite was good without weight loss. M spike was 0.3 gm/dL. 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  Follow up was planned for 1 year.   She had been followed by Dr. Rogue Bussing since 04/12/2016. She was last seen in the medical oncology clinic on 11/14/2017 by Dr. Rogue Bussing. She reported left shoulder range of motion and pain improvement with PT s/p fall. Patient denied any tingling and numbness. She denied any weight loss or unusual bone pain. She had no nausea no vomiting or weight loss. Patient denied any unusual shortness of breath or cough.  CBC and CMP were normal. M spike was 0.4 gm/dL.   The patient was noted to have an incidental RML lung nodule 4 mm on chest CT from 02/15/2017. No follow-up was needed if patient was low-risk. Non-contrast chest CT could have been considered in 12 months if patient was high-risk. CXR on 03/27/2018 revealed the right middle lobe lung nodule seen by CT in 01/2017 remained  radiographically occult. Chest CT would best evaluate stability or resolution of the nodule as necessary. There was stable and negative radiographic appearance of the chest. Chest CT denied by insurance.  CXR on 10/25/2018 revealed no acute cardiopulmonary disease and a stable examination.  M-spike (gm/dL) followed: 0.44 on 03/03/2014, 0.3 on 04/30/2015, 0.3 on 11/15/2016, 0.4 on 11/14/2017, 0.2 on 12/05/2018.  Kappa/lambda free light chains and ratio have remained normal from 04/21/2014 - 12/05/2018.    CBC on 12/05/2018 revealed hematocrit 40.7, hemoglobin 13.6, platelets 428,000, WBC 6,500.   Bilateral mammogram on 01/28/2019 revealed no evidence of malignancy.   The patient has a history of anemia. She has been followed closely by her PCP Dr. Einar Pheasant.   Symptomatically, she feels "fine".  She saw ENT for a mass below right ear that was has been present x 3-4 months; they decided to just monitor it for now. She has arthritis. She denies any skin changes, lumps, bumps, bruising or bleeding. She denies any infections. She notes not having a yearly follow up in 2020 with Dr. Rogue Bussing for due to COVID-19.  She received her last COVID-19 Moderna vaccine on 11/21/2019.  She follows up with Dr. Nicki Reaper every 4 months. She notes her last colonoscopy was > 10 years ago (overdue).    Past Medical History:  Diagnosis Date  . Arthritis   . Endometriosis   . Environmental allergies   . GERD (gastroesophageal reflux disease)   . Hypercholesterolemia   . Hypertension   . Uterine fibroid  Past Surgical History:  Procedure Laterality Date  . ABDOMINAL HYSTERECTOMY    . CESAREAN SECTION     x2  . OOPHORECTOMY     S/P left secondary to cyst  . THYROGLOSSAL DUCT CYST N/A 05/27/2015   Procedure: THYROGLOSSAL DUCT CYST EXCISION ;  Surgeon: Clyde Canterbury, MD;  Location: [facility] ORS;  Service: ENT;  Laterality: N/A;    Family History  Problem Relation Age of Onset  . Heart attack Father 23  .  Heart disease Father        bypass surgery  . Hypertension Father   . Hypercholesterolemia Father   . Kidney disease Father   . Diabetes Mother   . Diabetes Other        aunt  . Breast cancer Neg Hx     Social History:  reports that she has never smoked. She has never used smokeless tobacco. She reports that she does not drink alcohol or use drugs. Her sister has ITP. Her father has arthritis. She is a Technical sales engineer.  She lives in [place].  The patient is alone today.   Allergies:  Allergies  Allergen Reactions  . Cefdinir Itching  . Levofloxacin Other (See Comments)    Joint pain  . Simvastatin Other (See Comments)    "muscle aches"    Current Medications: Current Outpatient Medications  Medication Sig Dispense Refill  . atorvastatin (LIPITOR) 20 MG tablet TAKE 1 TABLET BY MOUTH EVERY DAY 90 tablet 3  . felodipine (PLENDIL) 5 MG 24 hr tablet TAKE 1 TABLET BY MOUTH EVERY DAY 90 tablet 1  . pantoprazole (PROTONIX) 40 MG tablet TAKE 1 TABLET BY MOUTH EVERY DAY 90 tablet 1  . triamterene-hydrochlorothiazide (MAXZIDE-25) 37.5-25 MG tablet TAKE 0.5 TABLETS BY MOUTH DAILY. 30 tablet 5  . cetirizine (ZYRTEC) 10 MG tablet TAKE 1 TABLET BY MOUTH EVERY DAY (Patient not taking: Reported on 01/08/2020) 30 tablet 0  . fluticasone (FLONASE) 50 MCG/ACT nasal spray Place 2 sprays into both nostrils daily. (Patient not taking: Reported on 01/08/2020) 16 g 5   No current facility-administered medications for this visit.    Review of Systems  Constitutional: Negative.  Negative for chills, diaphoresis, fever, malaise/fatigue and weight loss.       Feels "fine".  HENT: Negative.  Negative for congestion, ear discharge, ear pain, hearing loss, nosebleeds, sinus pain, sore throat and tinnitus.   Eyes: Negative.  Negative for blurred vision, double vision and photophobia.  Respiratory: Negative.  Negative for cough, hemoptysis, sputum production and shortness of breath.   Cardiovascular:  Negative.  Negative for chest pain, palpitations, orthopnea and leg swelling.  Gastrointestinal: Negative for abdominal pain, blood in stool, constipation, diarrhea, heartburn (reflux), melena, nausea and vomiting.       Last colonoscopy > 10 years ago.  Genitourinary: Negative.  Negative for dysuria, frequency, hematuria and urgency.  Musculoskeletal: Negative.  Negative for back pain, joint pain (arthritis), myalgias and neck pain.  Skin: Negative for itching and rash.       Small SQ mass below right ear; monitored by ENT.  Neurological: Negative.  Negative for dizziness, tingling, tremors, sensory change, speech change, focal weakness, weakness and headaches.  Endo/Heme/Allergies: Negative.  Does not bruise/bleed easily.  Psychiatric/Behavioral: Negative.  Negative for depression and memory loss. The patient is not nervous/anxious and does not have insomnia.   All other systems reviewed and are negative.  Performance status (ECOG): 0  Vitals Blood pressure 133/79, pulse 83, temperature (!) 96.5 F (35.8  C), temperature source Tympanic, resp. rate 16, weight 235 lb 10.8 oz (106.9 kg), last menstrual period 11/06/2000, SpO2 100 %.   Physical Exam  Constitutional: She is oriented to person, place, and time. She appears well-developed and well-nourished. No distress.  HENT:  Head: Normocephalic and atraumatic.  Mouth/Throat: Oropharynx is clear and moist. No oropharyngeal exudate.  Shoulder length styled black hair.  Mask.  Eyes: Pupils are equal, round, and reactive to light. Conjunctivae and EOM are normal. No scleral icterus.  Neck: No JVD present.  Cardiovascular: Normal rate, regular rhythm and normal heart sounds.  No murmur heard. Pulmonary/Chest: Effort normal and breath sounds normal. No respiratory distress. She has no wheezes. She has no rales. She exhibits no tenderness.  Abdominal: Soft. Bowel sounds are normal. She exhibits no distension and no mass. There is no abdominal  tenderness. There is no rebound and no guarding.  Musculoskeletal:        General: No tenderness or edema. Normal range of motion.     Cervical back: Normal range of motion and neck supple.  Lymphadenopathy:       Head (right side): No preauricular, no posterior auricular and no occipital adenopathy present.       Head (left side): No preauricular, no posterior auricular and no occipital adenopathy present.    She has no cervical adenopathy.    She has no axillary adenopathy.       Right: No inguinal and no supraclavicular adenopathy present.       Left: No inguinal and no supraclavicular adenopathy present.  Neurological: She is alert and oriented to person, place, and time.  Skin: Skin is warm and dry. She is not diaphoretic.  5 mm SQ non-fixed right sided mass at the angle of the jaw.  Psychiatric: She has a normal mood and affect. Her behavior is normal. Judgment and thought content normal.  Nursing note and vitals reviewed.   No visits with results within 3 Day(s) from this visit.  Latest known visit with results is:  Lab on 12/03/2019  Component Date Value Ref Range Status  . Sodium 12/03/2019 140  135 - 145 mEq/L Final  . Potassium 12/03/2019 3.8  3.5 - 5.1 mEq/L Final  . Chloride 12/03/2019 103  96 - 112 mEq/L Final  . CO2 12/03/2019 31  19 - 32 mEq/L Final  . Glucose, Bld 12/03/2019 92  70 - 99 mg/dL Final  . BUN 12/03/2019 9  6 - 23 mg/dL Final  . Creatinine, Ser 12/03/2019 0.78  0.40 - 1.20 mg/dL Final  . GFR 12/03/2019 92.60  >60.00 mL/min Final  . Calcium 12/03/2019 9.9  8.4 - 10.5 mg/dL Final  . Cholesterol 12/03/2019 206* 0 - 200 mg/dL Final   ATP III Classification       Desirable:  < 200 mg/dL               Borderline High:  200 - 239 mg/dL          High:  > = 240 mg/dL  . Triglycerides 12/03/2019 95.0  0.0 - 149.0 mg/dL Final   Normal:  <150 mg/dLBorderline High:  150 - 199 mg/dL  . HDL 12/03/2019 70.80  >39.00 mg/dL Final  . VLDL 12/03/2019 19.0  0.0 - 40.0  mg/dL Final  . LDL Cholesterol 12/03/2019 116* 0 - 99 mg/dL Final  . Total CHOL/HDL Ratio 12/03/2019 3   Final  Men          Women1/2 Average Risk     3.4          3.3Average Risk          5.0          4.42X Average Risk          9.6          7.13X Average Risk          15.0          11.0                      . NonHDL 12/03/2019 135.04   Final   NOTE:  Non-HDL goal should be 30 mg/dL higher than patient's LDL goal (i.e. LDL goal of < 70 mg/dL, would have non-HDL goal of < 100 mg/dL)  . Total Bilirubin 12/03/2019 0.7  0.2 - 1.2 mg/dL Final  . Bilirubin, Direct 12/03/2019 0.1  0.0 - 0.3 mg/dL Final  . Alkaline Phosphatase 12/03/2019 79  39 - 117 U/L Final  . AST 12/03/2019 14  0 - 37 U/L Final  . ALT 12/03/2019 13  0 - 35 U/L Final  . Total Protein 12/03/2019 7.8  6.0 - 8.3 g/dL Final  . Albumin 12/03/2019 4.2  3.5 - 5.2 g/dL Final  . WBC 12/03/2019 4.9  4.0 - 10.5 K/uL Final  . RBC 12/03/2019 4.59  3.87 - 5.11 Mil/uL Final  . Hemoglobin 12/03/2019 13.0  12.0 - 15.0 g/dL Final  . HCT 12/03/2019 39.1  36.0 - 46.0 % Final  . MCV 12/03/2019 85.3  78.0 - 100.0 fl Final  . MCHC 12/03/2019 33.3  30.0 - 36.0 g/dL Final  . RDW 12/03/2019 14.7  11.5 - 15.5 % Final  . Platelets 12/03/2019 460.0* 150.0 - 400.0 K/uL Final  . Neutrophils Relative % 12/03/2019 53.2  43.0 - 77.0 % Final  . Lymphocytes Relative 12/03/2019 32.7  12.0 - 46.0 % Final  . Monocytes Relative 12/03/2019 7.8  3.0 - 12.0 % Final  . Eosinophils Relative 12/03/2019 5.1* 0.0 - 5.0 % Final  . Basophils Relative 12/03/2019 1.2  0.0 - 3.0 % Final  . Neutro Abs 12/03/2019 2.6  1.4 - 7.7 K/uL Final  . Lymphs Abs 12/03/2019 1.6  0.7 - 4.0 K/uL Final  . Monocytes Absolute 12/03/2019 0.4  0.1 - 1.0 K/uL Final  . Eosinophils Absolute 12/03/2019 0.3  0.0 - 0.7 K/uL Final  . Basophils Absolute 12/03/2019 0.1  0.0 - 0.1 K/uL Final    Assessment:  Kathleen Gould is a 54 y.o. female with an IgG monoclonal gammopathy of  unknown significance (MGUS) with kappa light chain specificity. Bone survey on 04/21/2014 revealed no blastic or lytic lesions.   M-spike (gm/dL) followed: 0.44 on 03/03/2014, 0.3 on 04/30/2015, 0.3 on 11/15/2016, 0.4 on 11/14/2017, and 0.2 on 12/05/2018.  Kappa/lambda free light chains and ratio have remained normal from 04/21/2014 - 12/05/2018.   She was noted to have an incidental RML lung nodule 4 mm on chest CT from 02/15/2017.  CXR on 03/27/2018 and 10/25/2018 revealed no acute cardiopulmonary disease and a stable examination. Chest CT was previously denied.   She received her last COVID-19 Moderna vaccine on 11/21/2019.   Symptomatically, she feels "fine".  She denies any B symptoms.  She has a 5 mm SQ nodule at the angle of the jaw on the right side.  She has no adenopathy or hepatosplenomegaly.  Plan: 1.   Labs today:  CBC with diff, CMP, SPEP, FLCA. 2.   Monoclonal gammopathy of unknown significance (MGUS)  Review entire medical history, diagnosis and management of monoclonal gammopathy of unknown significance.  Discuss trend in M-spike since 03/03/2014- stable to improved.  Risk of transformation to myeloma or a lymphoproliferative disorder 1%/year.  Discuss yearly assessment unless any concerns. 3.   RML lung nodule  Patient has a history of 4 mm RML nodule since 02/15/2017.  She was felt to be at low risk.  Follow-up chest CT denied.  She has had yearly CXR x 2 (last 10/25/2018).  Consider future follow-up if any concerns. 4.   SQ nodule below angle of jaw  Etiology unclear.  ENT following closely.  Patient to contact clinic if any concerns that lesion is enlarging. 5.   Health maintenance  Patient notes being overdue for colonoscopy.  Patient previously seen in the [facility].  Send note to Dr Alice Reichert. 6.   RTC in 1 year for MD assessment and labs (CBC with diff, CMP, SPEP, FLCA).   I discussed the assessment and treatment plan with the patient.  The patient was  provided an opportunity to ask questions and all were answered.  The patient agreed with the plan and demonstrated an understanding of the instructions.  The patient was advised to call back if the symptoms worsen or if the condition fails to improve as anticipated.  I provided 20 minutes of face-to-face time during this this encounter and > 50% was spent counseling as documented under my assessment and plan.  An additional 15 minutes were spent reviewing her chart (Epic and Care Everywhere) including notes, labs, and imaging studies.    Renika Shiflet C. Mike Gip, MD, PhD    01/08/2020, 3:13 PM  I, Selena Batten, am acting as scribe for [employer]. Mike Gip, MD, PhD.  I, Dandre Sisler C. Mike Gip, MD, have reviewed the above documentation for accuracy and completeness, and I agree with the above.

## 2020-01-08 ENCOUNTER — Inpatient Hospital Stay: Payer: BC Managed Care – PPO

## 2020-01-08 ENCOUNTER — Encounter: Payer: Self-pay | Admitting: Hematology and Oncology

## 2020-01-08 ENCOUNTER — Other Ambulatory Visit: Payer: Self-pay

## 2020-01-08 ENCOUNTER — Other Ambulatory Visit: Payer: Self-pay | Admitting: Internal Medicine

## 2020-01-08 ENCOUNTER — Inpatient Hospital Stay: Payer: BC Managed Care – PPO | Attending: Hematology and Oncology | Admitting: Hematology and Oncology

## 2020-01-08 VITALS — BP 133/79 | HR 83 | Temp 96.5°F | Resp 16 | Wt 235.7 lb

## 2020-01-08 DIAGNOSIS — D472 Monoclonal gammopathy: Secondary | ICD-10-CM | POA: Diagnosis not present

## 2020-01-08 DIAGNOSIS — I10 Essential (primary) hypertension: Secondary | ICD-10-CM | POA: Insufficient documentation

## 2020-01-08 DIAGNOSIS — Z79899 Other long term (current) drug therapy: Secondary | ICD-10-CM | POA: Diagnosis not present

## 2020-01-08 DIAGNOSIS — Z833 Family history of diabetes mellitus: Secondary | ICD-10-CM | POA: Diagnosis not present

## 2020-01-08 DIAGNOSIS — Z8349 Family history of other endocrine, nutritional and metabolic diseases: Secondary | ICD-10-CM | POA: Insufficient documentation

## 2020-01-08 DIAGNOSIS — Z8249 Family history of ischemic heart disease and other diseases of the circulatory system: Secondary | ICD-10-CM | POA: Insufficient documentation

## 2020-01-08 DIAGNOSIS — Z90721 Acquired absence of ovaries, unilateral: Secondary | ICD-10-CM | POA: Diagnosis not present

## 2020-01-08 DIAGNOSIS — Z1231 Encounter for screening mammogram for malignant neoplasm of breast: Secondary | ICD-10-CM

## 2020-01-08 DIAGNOSIS — R918 Other nonspecific abnormal finding of lung field: Secondary | ICD-10-CM | POA: Diagnosis not present

## 2020-01-08 DIAGNOSIS — R911 Solitary pulmonary nodule: Secondary | ICD-10-CM | POA: Diagnosis not present

## 2020-01-08 DIAGNOSIS — E7800 Pure hypercholesterolemia, unspecified: Secondary | ICD-10-CM | POA: Insufficient documentation

## 2020-01-08 DIAGNOSIS — K219 Gastro-esophageal reflux disease without esophagitis: Secondary | ICD-10-CM | POA: Diagnosis not present

## 2020-01-08 DIAGNOSIS — Z90710 Acquired absence of both cervix and uterus: Secondary | ICD-10-CM | POA: Insufficient documentation

## 2020-01-08 LAB — LACTATE DEHYDROGENASE: LDH: 218 U/L — ABNORMAL HIGH (ref 98–192)

## 2020-01-08 LAB — CBC WITH DIFFERENTIAL/PLATELET
Abs Immature Granulocytes: 0.01 10*3/uL (ref 0.00–0.07)
Basophils Absolute: 0 10*3/uL (ref 0.0–0.1)
Basophils Relative: 0 %
Eosinophils Absolute: 0.2 10*3/uL (ref 0.0–0.5)
Eosinophils Relative: 4 %
HCT: 41.1 % (ref 36.0–46.0)
Hemoglobin: 13.4 g/dL (ref 12.0–15.0)
Immature Granulocytes: 0 %
Lymphocytes Relative: 31 %
Lymphs Abs: 1.7 10*3/uL (ref 0.7–4.0)
MCH: 28 pg (ref 26.0–34.0)
MCHC: 32.6 g/dL (ref 30.0–36.0)
MCV: 85.8 fL (ref 80.0–100.0)
Monocytes Absolute: 0.3 10*3/uL (ref 0.1–1.0)
Monocytes Relative: 5 %
Neutro Abs: 3.5 10*3/uL (ref 1.7–7.7)
Neutrophils Relative %: 60 %
Platelets: 463 10*3/uL — ABNORMAL HIGH (ref 150–400)
RBC: 4.79 MIL/uL (ref 3.87–5.11)
RDW: 14.5 % (ref 11.5–15.5)
WBC: 5.7 10*3/uL (ref 4.0–10.5)
nRBC: 0 % (ref 0.0–0.2)

## 2020-01-08 LAB — COMPREHENSIVE METABOLIC PANEL
ALT: 18 U/L (ref 0–44)
AST: 17 U/L (ref 15–41)
Albumin: 4.4 g/dL (ref 3.5–5.0)
Alkaline Phosphatase: 78 U/L (ref 38–126)
Anion gap: 9 (ref 5–15)
BUN: 8 mg/dL (ref 6–20)
CO2: 28 mmol/L (ref 22–32)
Calcium: 9.4 mg/dL (ref 8.9–10.3)
Chloride: 101 mmol/L (ref 98–111)
Creatinine, Ser: 0.85 mg/dL (ref 0.44–1.00)
GFR calc Af Amer: >60 mL/min (ref >60)
GFR calc non Af Amer: >60 mL/min (ref >60)
Glucose, Bld: 101 mg/dL — ABNORMAL HIGH (ref 70–99)
Potassium: 3.4 mmol/L — ABNORMAL LOW (ref 3.5–5.1)
Sodium: 138 mmol/L (ref 135–145)
Total Bilirubin: 0.5 mg/dL (ref 0.3–1.2)
Total Protein: 8.5 g/dL — ABNORMAL HIGH (ref 6.5–8.1)

## 2020-01-08 NOTE — Progress Notes (Signed)
Saw ENT for for a mass below right ear and decided to just monitor it for now. No other concerns at this time.

## 2020-01-09 LAB — PROTEIN ELECTROPHORESIS, SERUM
A/G Ratio: 0.9 (ref 0.7–1.7)
Albumin ELP: 3.7 g/dL (ref 2.9–4.4)
Alpha-1-Globulin: 0.2 g/dL (ref 0.0–0.4)
Alpha-2-Globulin: 0.7 g/dL (ref 0.4–1.0)
Beta Globulin: 1.4 g/dL — ABNORMAL HIGH (ref 0.7–1.3)
Gamma Globulin: 1.6 g/dL (ref 0.4–1.8)
Globulin, Total: 4 g/dL — ABNORMAL HIGH (ref 2.2–3.9)
M-Spike, %: 0.5 g/dL — ABNORMAL HIGH
Total Protein ELP: 7.7 g/dL (ref 6.0–8.5)

## 2020-01-09 LAB — KAPPA/LAMBDA LIGHT CHAINS
Kappa free light chain: 25 mg/L — ABNORMAL HIGH (ref 3.3–19.4)
Kappa, lambda light chain ratio: 1.4 (ref 0.26–1.65)
Lambda free light chains: 17.8 mg/L (ref 5.7–26.3)

## 2020-01-29 ENCOUNTER — Ambulatory Visit
Admission: RE | Admit: 2020-01-29 | Discharge: 2020-01-29 | Disposition: A | Discharge status: Home / Self Care | Payer: BC Managed Care – PPO | Source: Ambulatory Visit | Attending: Internal Medicine | Admitting: Internal Medicine

## 2020-01-29 ENCOUNTER — Other Ambulatory Visit: Payer: Self-pay

## 2020-01-29 DIAGNOSIS — Z1231 Encounter for screening mammogram for malignant neoplasm of breast: Secondary | ICD-10-CM | POA: Diagnosis not present

## 2020-02-26 ENCOUNTER — Other Ambulatory Visit: Payer: Self-pay | Admitting: Internal Medicine

## 2020-02-26 DIAGNOSIS — K219 Gastro-esophageal reflux disease without esophagitis: Secondary | ICD-10-CM

## 2020-03-04 ENCOUNTER — Other Ambulatory Visit: Payer: Self-pay

## 2020-03-04 ENCOUNTER — Encounter: Payer: Self-pay | Admitting: Internal Medicine

## 2020-03-04 ENCOUNTER — Ambulatory Visit (INDEPENDENT_AMBULATORY_CARE_PROVIDER_SITE_OTHER): Payer: BC Managed Care – PPO | Admitting: Internal Medicine

## 2020-03-04 VITALS — BP 118/70 | HR 84 | Temp 97.6°F | Resp 16 | Ht 65.0 in | Wt 233.0 lb

## 2020-03-04 DIAGNOSIS — F439 Reaction to severe stress, unspecified: Secondary | ICD-10-CM

## 2020-03-04 DIAGNOSIS — I10 Essential (primary) hypertension: Secondary | ICD-10-CM

## 2020-03-04 DIAGNOSIS — Z0000 Encounter for general adult medical examination without abnormal findings: Secondary | ICD-10-CM

## 2020-03-04 DIAGNOSIS — Z9109 Other allergy status, other than to drugs and biological substances: Secondary | ICD-10-CM

## 2020-03-04 DIAGNOSIS — E7800 Pure hypercholesterolemia, unspecified: Secondary | ICD-10-CM | POA: Diagnosis not present

## 2020-03-04 DIAGNOSIS — K219 Gastro-esophageal reflux disease without esophagitis: Secondary | ICD-10-CM

## 2020-03-04 DIAGNOSIS — M25522 Pain in left elbow: Secondary | ICD-10-CM

## 2020-03-04 DIAGNOSIS — D472 Monoclonal gammopathy: Secondary | ICD-10-CM

## 2020-03-04 LAB — BASIC METABOLIC PANEL
BUN: 8 mg/dL (ref 6–23)
CO2: 27 mEq/L (ref 19–32)
Calcium: 10 mg/dL (ref 8.4–10.5)
Chloride: 103 mEq/L (ref 96–112)
Creatinine, Ser: 0.7 mg/dL (ref 0.40–1.20)
GFR: 104.82 mL/min (ref >60.00)
Glucose, Bld: 93 mg/dL (ref 70–99)
Potassium: 3.8 mEq/L (ref 3.5–5.1)
Sodium: 138 mEq/L (ref 135–145)

## 2020-03-04 LAB — LIPID PANEL
Cholesterol: 201 mg/dL — ABNORMAL HIGH (ref 0–200)
HDL: 68.1 mg/dL (ref >39.00)
LDL Cholesterol: 116 mg/dL — ABNORMAL HIGH (ref 0–99)
NonHDL: 133.12
Total CHOL/HDL Ratio: 3
Triglycerides: 88 mg/dL (ref 0.0–149.0)
VLDL: 17.6 mg/dL (ref 0.0–40.0)

## 2020-03-04 NOTE — Patient Instructions (Signed)
Elbow strap 

## 2020-03-04 NOTE — Assessment & Plan Note (Addendum)
Physical today 03/04/20.  Scheduled to see GI for colonoscopy.  Mammogram 01/30/20 - Birads I.

## 2020-03-04 NOTE — Progress Notes (Signed)
Patient ID: Kathleen Gould, female   DOB: 1966/07/12, 54 y.o.   MRN: [id number]   Subjective:    Patient ID: Kathleen Gould, female    DOB: [birth year], 54 y.o.   MRN: [id number]  HPI This visit occurred during the SARS-CoV-2 public health emergency.  Safety protocols were in place, including screening questions prior to the visit, additional usage of staff PPE, and extensive cleaning of exam room while observing appropriate contact time as indicated for disinfecting solutions.  Patient here for her physical exam.  She reports she is doing well.  Handling stress.  Out of school currently.  Has noticed some pain - left elbow and left forearm.  Has been present for a couple of months.  Followed by hematology - Dr Mike Gip.  Stable.  Tries to stay active.  No chest pain or sob reported.  No abdominal pain or bowel change.  Overall she feels she is doing relatively well.    Past Medical History:  Diagnosis Date  . Arthritis   . Endometriosis   . Environmental allergies   . GERD (gastroesophageal reflux disease)   . Hypercholesterolemia   . Hypertension   . Uterine fibroid    Past Surgical History:  Procedure Laterality Date  . ABDOMINAL HYSTERECTOMY    . CESAREAN SECTION     x2  . OOPHORECTOMY     S/P left secondary to cyst  . THYROGLOSSAL DUCT CYST N/A 05/27/2015   Procedure: THYROGLOSSAL DUCT CYST EXCISION ;  Surgeon: Clyde Canterbury, MD;  Location: [facility] ORS;  Service: ENT;  Laterality: N/A;   Family History  Problem Relation Age of Onset  . Heart attack Father 8  . Heart disease Father        bypass surgery  . Hypertension Father   . Hypercholesterolemia Father   . Kidney disease Father   . Diabetes Mother   . Diabetes Other        aunt  . Breast cancer Neg Hx    Social History   Socioeconomic History  . Marital status: Married    Spouse name: Not on file  . Number of children: 2  . Years of education: Not on file  . Highest education level: Not on file    Occupational History  . Not on file  Tobacco Use  . Smoking status: Never Smoker  . Smokeless tobacco: Never Used  Vaping Use  . Vaping Use: Never used  Substance and Sexual Activity  . Alcohol use: No    Alcohol/week: 0.0 standard drinks  . Drug use: No  . Sexual activity: Not Currently  Other Topics Concern  . Not on file  Social History Narrative   Widowed    Social Determinants of Health   Financial Resource Strain:   . Difficulty of Paying Living Expenses:   Food Insecurity:   . Worried About Charity fundraiser in the Last Year:   . Arboriculturist in the Last Year:   Transportation Needs:   . Film/video editor (Medical):   Marland Kitchen Lack of Transportation (Non-Medical):   Physical Activity:   . Days of Exercise per Week:   . Minutes of Exercise per Session:   Stress:   . Feeling of Stress :   Social Connections:   . Frequency of Communication with Friends and Family:   . Frequency of Social Gatherings with Friends and Family:   . Attends Religious Services:   . Active Member of Clubs or Organizations:   .  Attends Archivist Meetings:   Marland Kitchen Marital Status:     Outpatient Encounter Medications as of 03/04/2020  Medication Sig  . atorvastatin (LIPITOR) 20 MG tablet TAKE 1 TABLET BY MOUTH EVERY DAY  . cetirizine (ZYRTEC) 10 MG tablet TAKE 1 TABLET BY MOUTH EVERY DAY (Patient not taking: Reported on 01/08/2020)  . felodipine (PLENDIL) 5 MG 24 hr tablet TAKE 1 TABLET BY MOUTH EVERY DAY  . fluticasone (FLONASE) 50 MCG/ACT nasal spray Place 2 sprays into both nostrils daily. (Patient not taking: Reported on 01/08/2020)  . pantoprazole (PROTONIX) 40 MG tablet TAKE 1 TABLET BY MOUTH EVERY DAY  . triamterene-hydrochlorothiazide (MAXZIDE-25) 37.5-25 MG tablet TAKE 0.5 TABLETS BY MOUTH DAILY.   No facility-administered encounter medications on file as of 03/04/2020.    Review of Systems  Constitutional: Negative for appetite change and unexpected weight change.   HENT: Negative for congestion and sinus pressure.   Eyes: Negative for pain and visual disturbance.  Respiratory: Negative for cough, chest tightness and shortness of breath.   Cardiovascular: Negative for chest pain, palpitations and leg swelling.  Gastrointestinal: Negative for abdominal pain, diarrhea, nausea and vomiting.  Genitourinary: Negative for difficulty urinating and dysuria.  Musculoskeletal: Negative for joint swelling and myalgias.  Skin: Negative for color change and rash.  Neurological: Negative for dizziness, light-headedness and headaches.  Hematological: Negative for adenopathy. Does not bruise/bleed easily.  Psychiatric/Behavioral: Negative for agitation and dysphoric mood.       Objective:    Physical Exam Vitals reviewed.  Constitutional:      General: She is not in acute distress.    Appearance: Normal appearance. She is well-developed.  HENT:     Head: Normocephalic and atraumatic.     Right Ear: External ear normal.     Left Ear: External ear normal.  Eyes:     General: No scleral icterus.       Right eye: No discharge.        Left eye: No discharge.     Conjunctiva/sclera: Conjunctivae normal.  Neck:     Thyroid: No thyromegaly.  Cardiovascular:     Rate and Rhythm: Normal rate and regular rhythm.  Pulmonary:     Effort: No tachypnea, accessory muscle usage or respiratory distress.     Breath sounds: Normal breath sounds. No decreased breath sounds or wheezing.  Chest:     Breasts:        Right: No inverted nipple, mass, nipple discharge or tenderness (no axillary adenopathy).        Left: No inverted nipple, mass, nipple discharge or tenderness (no axilarry adenopathy).  Abdominal:     General: Bowel sounds are normal.     Palpations: Abdomen is soft.     Tenderness: There is no abdominal tenderness.  Musculoskeletal:        General: No swelling or tenderness.     Cervical back: Neck supple. No tenderness.     Comments: Increased pain with  palpation - left elbow.    Lymphadenopathy:     Cervical: No cervical adenopathy.  Skin:    Findings: No erythema or rash.  Neurological:     Mental Status: She is alert and oriented to person, place, and time.  Psychiatric:        Mood and Affect: Mood normal.        Behavior: Behavior normal.     BP 118/70   Pulse 84   Temp 97.6 F (36.4 C)   Resp 16  Ht 5\' 5"  (1.651 m)   Wt 233 lb (105.7 kg)   LMP 11/06/2000   SpO2 97%   BMI 38.77 kg/m  Wt Readings from Last 3 Encounters:  03/04/20 233 lb (105.7 kg)  01/08/20 235 lb 10.8 oz (106.9 kg)  10/31/19 238 lb 12.8 oz (108.3 kg)     Lab Results  Component Value Date   WBC 5.7 01/08/2020   HGB 13.4 01/08/2020   HCT 41.1 01/08/2020   PLT 463 (H) 01/08/2020   GLUCOSE 93 03/04/2020   CHOL 201 (H) 03/04/2020   TRIG 88.0 03/04/2020   HDL 68.10 03/04/2020   LDLDIRECT 164.5 07/24/2013   LDLCALC 116 (H) 03/04/2020   ALT 18 01/08/2020   AST 17 01/08/2020   NA 138 03/04/2020   K 3.8 03/04/2020   CL 103 03/04/2020   CREATININE 0.70 03/04/2020   BUN 8 03/04/2020   CO2 27 03/04/2020   TSH 1.55 07/10/2019   INR 1.0 04/21/2014    MM 3D SCREEN BREAST BILATERAL  Result Date: 01/30/2020 CLINICAL DATA:  Screening. EXAM: DIGITAL SCREENING BILATERAL MAMMOGRAM WITH TOMO AND CAD COMPARISON:  Previous exam(s). ACR Breast Density Category a: The breast tissue is almost entirely fatty. FINDINGS: There are no findings suspicious for malignancy. Images were processed with CAD. IMPRESSION: No mammographic evidence of malignancy. A result letter of this screening mammogram will be mailed directly to the patient. RECOMMENDATION: Screening mammogram in one year. (Code:SM-B-01Y) BI-RADS CATEGORY  1: Negative. Electronically Signed   By: Margarette Canada M.D.   On: 01/30/2020 12:22       Assessment & Plan:   Problem List Items Addressed This Visit    Environmental allergies    Controlled.       Esophageal reflux    Controlled.  On protonix.          Essential hypertension, benign    Blood pressure as outlined.  On plendil.  Follow pressures.  Follow metabolic panel.       Relevant Orders   Basic metabolic panel (Completed)   Health care maintenance    Physical today 03/04/20.  Scheduled to see GI for colonoscopy.  Mammogram 01/30/20 - Birads I.        Left elbow pain    Elbow strap.  Notify me if persistent.       MGUS (monoclonal gammopathy of unknown significance)    Followed by hematology.  Stable.        Pure hypercholesterolemia    On lipitor.  Low cholesterol diet and exercise.  Follow lipid panel and liver function tests.        Relevant Orders   Lipid panel (Completed)   Stress    Overall appears to be handling things well.  Follow.         Other Visit Diagnoses    Routine general medical examination at a health care facility    -  Primary       Einar Pheasant, MD

## 2020-03-08 ENCOUNTER — Encounter: Payer: Self-pay | Admitting: Internal Medicine

## 2020-03-08 DIAGNOSIS — M25522 Pain in left elbow: Secondary | ICD-10-CM | POA: Insufficient documentation

## 2020-03-08 NOTE — Assessment & Plan Note (Signed)
Followed by hematology. Stable.  

## 2020-03-08 NOTE — Assessment & Plan Note (Signed)
Controlled.  

## 2020-03-08 NOTE — Assessment & Plan Note (Signed)
Blood pressure as outlined.  On plendil.  Follow pressures.  Follow metabolic panel.

## 2020-03-08 NOTE — Assessment & Plan Note (Signed)
Controlled.  On protonix.   

## 2020-03-08 NOTE — Assessment & Plan Note (Signed)
Overall appears to be handling things well.  Follow.  ?

## 2020-03-08 NOTE — Assessment & Plan Note (Signed)
On lipitor.  Low cholesterol diet and exercise.  Follow lipid panel and liver function tests.   

## 2020-03-08 NOTE — Assessment & Plan Note (Signed)
Elbow strap.  Notify me if persistent.

## 2020-05-04 ENCOUNTER — Other Ambulatory Visit: Payer: Self-pay | Admitting: Internal Medicine

## 2020-07-02 ENCOUNTER — Other Ambulatory Visit: Payer: Self-pay | Admitting: Internal Medicine

## 2020-07-15 ENCOUNTER — Ambulatory Visit: Payer: BC Managed Care – PPO | Admitting: Internal Medicine

## 2020-07-21 ENCOUNTER — Ambulatory Visit: Payer: BC Managed Care – PPO | Admitting: Internal Medicine

## 2020-07-21 ENCOUNTER — Other Ambulatory Visit: Payer: Self-pay

## 2020-07-21 DIAGNOSIS — Z9109 Other allergy status, other than to drugs and biological substances: Secondary | ICD-10-CM

## 2020-07-21 DIAGNOSIS — D649 Anemia, unspecified: Secondary | ICD-10-CM

## 2020-07-21 DIAGNOSIS — F439 Reaction to severe stress, unspecified: Secondary | ICD-10-CM

## 2020-07-21 DIAGNOSIS — E7800 Pure hypercholesterolemia, unspecified: Secondary | ICD-10-CM

## 2020-07-21 DIAGNOSIS — D472 Monoclonal gammopathy: Secondary | ICD-10-CM

## 2020-07-21 DIAGNOSIS — I10 Essential (primary) hypertension: Secondary | ICD-10-CM

## 2020-07-21 DIAGNOSIS — R911 Solitary pulmonary nodule: Secondary | ICD-10-CM

## 2020-07-21 NOTE — Progress Notes (Signed)
Patient ID: Kathleen Gould, female   DOB: 08-Jan-1966, 54 y.o.   MRN: [id number]   Subjective:    Patient ID: Kathleen Gould, female    DOB: [birth year], 54 y.o.   MRN: [id number]  HPI This visit occurred during the SARS-CoV-2 public health emergency.  Safety protocols were in place, including screening questions prior to the visit, additional usage of staff PPE, and extensive cleaning of exam room while observing appropriate contact time as indicated for disinfecting solutions.  Patient here for a scheduled follow up. Here to f/u regarding her blood pressure and cholesterol. Is more physically active.  No chest pain or sob reported.  No abdominal pain or bowel change reported.  Blood pressure doing well.  Has lost weight.  States is because she is more active.  Just received covid booster.  Wants to hold on flu vaccine.     Past Medical History:  Diagnosis Date  . Arthritis   . Endometriosis   . Environmental allergies   . GERD (gastroesophageal reflux disease)   . Hypercholesterolemia   . Hypertension   . Uterine fibroid    Past Surgical History:  Procedure Laterality Date  . ABDOMINAL HYSTERECTOMY    . CESAREAN SECTION     x2  . OOPHORECTOMY     S/P left secondary to cyst  . THYROGLOSSAL DUCT CYST N/A 05/27/2015   Procedure: THYROGLOSSAL DUCT CYST EXCISION ;  Surgeon: Clyde Canterbury, MD;  Location: [facility] ORS;  Service: ENT;  Laterality: N/A;   Family History  Problem Relation Age of Onset  . Heart attack Father 80  . Heart disease Father        bypass surgery  . Hypertension Father   . Hypercholesterolemia Father   . Kidney disease Father   . Diabetes Mother   . Diabetes Other        aunt  . Breast cancer Neg Hx    Social History   Socioeconomic History  . Marital status: Married    Spouse name: Not on file  . Number of children: 2  . Years of education: Not on file  . Highest education level: Not on file  Occupational History  . Not on file  Tobacco Use  .  Smoking status: Never Smoker  . Smokeless tobacco: Never Used  Vaping Use  . Vaping Use: Never used  Substance and Sexual Activity  . Alcohol use: No    Alcohol/week: 0.0 standard drinks  . Drug use: No  . Sexual activity: Not Currently  Other Topics Concern  . Not on file  Social History Narrative   Widowed    Social Determinants of Health   Financial Resource Strain:   . Difficulty of Paying Living Expenses: Not on file  Food Insecurity:   . Worried About Charity fundraiser in the Last Year: Not on file  . Ran Out of Food in the Last Year: Not on file  Transportation Needs:   . Lack of Transportation (Medical): Not on file  . Lack of Transportation (Non-Medical): Not on file  Physical Activity:   . Days of Exercise per Week: Not on file  . Minutes of Exercise per Session: Not on file  Stress:   . Feeling of Stress : Not on file  Social Connections:   . Frequency of Communication with Friends and Family: Not on file  . Frequency of Social Gatherings with Friends and Family: Not on file  . Attends Religious Services: Not on file  .  Active Member of Clubs or Organizations: Not on file  . Attends Archivist Meetings: Not on file  . Marital Status: Not on file    Outpatient Encounter Medications as of 07/21/2020  Medication Sig  . atorvastatin (LIPITOR) 20 MG tablet TAKE 1 TABLET BY MOUTH EVERY DAY  . cetirizine (ZYRTEC) 10 MG tablet TAKE 1 TABLET BY MOUTH EVERY DAY (Patient not taking: Reported on 01/08/2020)  . felodipine (PLENDIL) 5 MG 24 hr tablet TAKE 1 TABLET BY MOUTH EVERY DAY  . fluticasone (FLONASE) 50 MCG/ACT nasal spray Place 2 sprays into both nostrils daily. (Patient not taking: Reported on 01/08/2020)  . pantoprazole (PROTONIX) 40 MG tablet TAKE 1 TABLET BY MOUTH EVERY DAY  . triamterene-hydrochlorothiazide (MAXZIDE-25) 37.5-25 MG tablet TAKE 0.5 TABLETS BY MOUTH DAILY.   No facility-administered encounter medications on file as of 07/21/2020.     Review of Systems  Constitutional: Negative for appetite change and unexpected weight change.  HENT: Negative for congestion and sinus pressure.   Respiratory: Negative for cough, chest tightness and shortness of breath.   Cardiovascular: Negative for chest pain, palpitations and leg swelling.  Gastrointestinal: Negative for abdominal pain, diarrhea, nausea and vomiting.  Genitourinary: Negative for difficulty urinating and dysuria.  Musculoskeletal: Negative for joint swelling and myalgias.  Skin: Negative for color change and rash.  Neurological: Negative for dizziness, light-headedness and headaches.  Psychiatric/Behavioral: Negative for agitation and dysphoric mood.       Objective:    Physical Exam Vitals reviewed.  Constitutional:      General: She is not in acute distress.    Appearance: Normal appearance.  HENT:     Head: Normocephalic and atraumatic.     Right Ear: External ear normal.     Left Ear: External ear normal.  Eyes:     General: No scleral icterus.       Right eye: No discharge.        Left eye: No discharge.     Conjunctiva/sclera: Conjunctivae normal.  Neck:     Thyroid: No thyromegaly.  Cardiovascular:     Rate and Rhythm: Normal rate and regular rhythm.  Pulmonary:     Effort: No respiratory distress.     Breath sounds: Normal breath sounds. No wheezing.  Abdominal:     General: Bowel sounds are normal.     Palpations: Abdomen is soft.     Tenderness: There is no abdominal tenderness.  Musculoskeletal:        General: No swelling or tenderness.     Cervical back: Neck supple. No tenderness.  Lymphadenopathy:     Cervical: No cervical adenopathy.  Skin:    Findings: No erythema or rash.  Neurological:     Mental Status: She is alert.  Psychiatric:        Mood and Affect: Mood normal.        Behavior: Behavior normal.     BP 112/70   Pulse 91   Temp 98.6 F (37 C) (Oral)   Resp 16   Ht 5\' 5"  (1.651 m)   Wt 226 lb (102.5 kg)    LMP 11/06/2000   SpO2 99%   BMI 37.61 kg/m  Wt Readings from Last 3 Encounters:  07/21/20 226 lb (102.5 kg)  03/04/20 233 lb (105.7 kg)  01/08/20 235 lb 10.8 oz (106.9 kg)     Lab Results  Component Value Date   WBC 5.7 01/08/2020   HGB 13.4 01/08/2020   HCT 41.1 01/08/2020  PLT 463 (H) 01/08/2020   GLUCOSE 93 03/04/2020   CHOL 201 (H) 03/04/2020   TRIG 88.0 03/04/2020   HDL 68.10 03/04/2020   LDLDIRECT 164.5 07/24/2013   LDLCALC 116 (H) 03/04/2020   ALT 18 01/08/2020   AST 17 01/08/2020   NA 138 03/04/2020   K 3.8 03/04/2020   CL 103 03/04/2020   CREATININE 0.70 03/04/2020   BUN 8 03/04/2020   CO2 27 03/04/2020   TSH 1.55 07/10/2019   INR 1.0 04/21/2014    MM 3D SCREEN BREAST BILATERAL  Result Date: 01/30/2020 CLINICAL DATA:  Screening. EXAM: DIGITAL SCREENING BILATERAL MAMMOGRAM WITH TOMO AND CAD COMPARISON:  Previous exam(s). ACR Breast Density Category a: The breast tissue is almost entirely fatty. FINDINGS: There are no findings suspicious for malignancy. Images were processed with CAD. IMPRESSION: No mammographic evidence of malignancy. A result letter of this screening mammogram will be mailed directly to the patient. RECOMMENDATION: Screening mammogram in one year. (Code:SM-B-01Y) BI-RADS CATEGORY  1: Negative. Electronically Signed   By: Margarette Canada M.D.   On: 01/30/2020 12:22       Assessment & Plan:   Problem List Items Addressed This Visit    Stress    Overall appears to be doing well.  Follow.       Pure hypercholesterolemia    On lipitor.  Low cholesterol diet and exercise.  Follow lipid panel and liver function tests.        Relevant Orders   Lipid panel   Hepatic function panel   MGUS (monoclonal gammopathy of unknown significance)    Followed by hematology.  Stable.       Lung nodule, solitary    Seeing hematology.  CT chest - denied by insurance.  Had cxr.  Followed by hematology.       Essential hypertension, benign    Blood pressure  doing well.  On plendil.  Follow pressures.  Follow metabolic panel.       Relevant Orders   TSH   Basic metabolic panel   Environmental allergies    Controlled.       Anemia    Followed by hematology.           Einar Pheasant, MD

## 2020-07-25 ENCOUNTER — Encounter: Payer: Self-pay | Admitting: Internal Medicine

## 2020-07-25 NOTE — Assessment & Plan Note (Signed)
Seeing hematology.  CT chest - denied by insurance.  Had cxr.  Followed by hematology.  

## 2020-07-25 NOTE — Assessment & Plan Note (Signed)
Followed by hematology 

## 2020-07-25 NOTE — Assessment & Plan Note (Signed)
Controlled.  

## 2020-07-25 NOTE — Assessment & Plan Note (Signed)
Overall appears to be doing well.  Follow.  

## 2020-07-25 NOTE — Assessment & Plan Note (Signed)
On lipitor.  Low cholesterol diet and exercise.  Follow lipid panel and liver function tests.   

## 2020-07-25 NOTE — Assessment & Plan Note (Signed)
Blood pressure doing well.  On plendil.  Follow pressures.  Follow metabolic panel.

## 2020-07-25 NOTE — Assessment & Plan Note (Signed)
Followed by hematology. Stable.  

## 2020-08-19 LAB — HM COLONOSCOPY

## 2020-09-08 ENCOUNTER — Other Ambulatory Visit: Payer: Self-pay | Admitting: Internal Medicine

## 2020-09-08 DIAGNOSIS — K219 Gastro-esophageal reflux disease without esophagitis: Secondary | ICD-10-CM

## 2020-09-14 ENCOUNTER — Other Ambulatory Visit: Payer: BC Managed Care – PPO

## 2020-10-30 ENCOUNTER — Other Ambulatory Visit: Payer: Self-pay | Admitting: Internal Medicine

## 2020-11-19 ENCOUNTER — Ambulatory Visit (INDEPENDENT_AMBULATORY_CARE_PROVIDER_SITE_OTHER): Payer: BC Managed Care – PPO | Admitting: Internal Medicine

## 2020-11-19 ENCOUNTER — Other Ambulatory Visit: Payer: Self-pay

## 2020-11-19 DIAGNOSIS — R221 Localized swelling, mass and lump, neck: Secondary | ICD-10-CM | POA: Diagnosis not present

## 2020-11-19 DIAGNOSIS — D649 Anemia, unspecified: Secondary | ICD-10-CM

## 2020-11-19 DIAGNOSIS — E7800 Pure hypercholesterolemia, unspecified: Secondary | ICD-10-CM

## 2020-11-19 DIAGNOSIS — F439 Reaction to severe stress, unspecified: Secondary | ICD-10-CM | POA: Diagnosis not present

## 2020-11-19 DIAGNOSIS — I10 Essential (primary) hypertension: Secondary | ICD-10-CM

## 2020-11-19 DIAGNOSIS — R911 Solitary pulmonary nodule: Secondary | ICD-10-CM

## 2020-11-19 DIAGNOSIS — K219 Gastro-esophageal reflux disease without esophagitis: Secondary | ICD-10-CM

## 2020-11-19 DIAGNOSIS — D472 Monoclonal gammopathy: Secondary | ICD-10-CM

## 2020-11-19 NOTE — Progress Notes (Signed)
Patient ID: Kathleen Gould, female   DOB: Aug 31, 1965, 55 y.o.   MRN: [id number]   Subjective:    Patient ID: Kathleen Gould, female    DOB: 1966/03/27, 55 y.o.   MRN: [id number]  HPI This visit occurred during the SARS-CoV-2 public health emergency.  Safety protocols were in place, including screening questions prior to the visit, additional usage of staff PPE, and extensive cleaning of exam room while observing appropriate contact time as indicated for disinfecting solutions.  Patient here for a scheduled follow up. Increased stress - father passed 07/2020.  Has good support.  Does not feel needs any further intervention.  Weight is down.  Discussed continued diet and exercise.  No chest pain or sob.  No acid reflux reported.  No abdominal pain or bowel change reported. Saw Dr Richardson Landry - regarding her neck.  States no further w/up warranted.  Watch.  F/u in one year.  Has seen GI - Dr Alice Reichert.  Obtain report.     Past Medical History:  Diagnosis Date  . Arthritis   . Endometriosis   . Environmental allergies   . GERD (gastroesophageal reflux disease)   . Hypercholesterolemia   . Hypertension   . Uterine fibroid    Past Surgical History:  Procedure Laterality Date  . ABDOMINAL HYSTERECTOMY    . CESAREAN SECTION     x2  . OOPHORECTOMY     S/P left secondary to cyst  . THYROGLOSSAL DUCT CYST N/A 05/27/2015   Procedure: THYROGLOSSAL DUCT CYST EXCISION ;  Surgeon: Clyde Canterbury, MD;  Location: [facility] ORS;  Service: ENT;  Laterality: N/A;   Family History  Problem Relation Age of Onset  . Heart attack Father 39  . Heart disease Father        bypass surgery  . Hypertension Father   . Hypercholesterolemia Father   . Kidney disease Father   . Diabetes Mother   . Diabetes Other        aunt  . Breast cancer Neg Hx    Social History   Socioeconomic History  . Marital status: Married    Spouse name: Not on file  . Number of children: 2  . Years of education: Not on file  .  Highest education level: Not on file  Occupational History  . Not on file  Tobacco Use  . Smoking status: Never Smoker  . Smokeless tobacco: Never Used  Vaping Use  . Vaping Use: Never used  Substance and Sexual Activity  . Alcohol use: No    Alcohol/week: 0.0 standard drinks  . Drug use: No  . Sexual activity: Not Currently  Other Topics Concern  . Not on file  Social History Narrative   Widowed    Social Determinants of Health   Financial Resource Strain: Not on file  Food Insecurity: Not on file  Transportation Needs: Not on file  Physical Activity: Not on file  Stress: Not on file  Social Connections: Not on file    Outpatient Encounter Medications as of 11/19/2020  Medication Sig  . atorvastatin (LIPITOR) 20 MG tablet TAKE 1 TABLET BY MOUTH EVERY DAY  . felodipine (PLENDIL) 5 MG 24 hr tablet TAKE 1 TABLET BY MOUTH EVERY DAY  . pantoprazole (PROTONIX) 40 MG tablet TAKE 1 TABLET BY MOUTH EVERY DAY  . triamterene-hydrochlorothiazide (MAXZIDE-25) 37.5-25 MG tablet TAKE 0.5 TABLETS BY MOUTH DAILY.  . [DISCONTINUED] cetirizine (ZYRTEC) 10 MG tablet TAKE 1 TABLET BY MOUTH EVERY DAY (Patient not taking: Reported  on 01/08/2020)  . [DISCONTINUED] fluticasone (FLONASE) 50 MCG/ACT nasal spray Place 2 sprays into both nostrils daily. (Patient not taking: Reported on 01/08/2020)   No facility-administered encounter medications on file as of 11/19/2020.    Review of Systems  Constitutional: Negative for appetite change and unexpected weight change.  HENT: Negative for congestion and sinus pressure.   Respiratory: Negative for cough, chest tightness and shortness of breath.   Cardiovascular: Negative for chest pain, palpitations and leg swelling.  Gastrointestinal: Negative for abdominal pain, diarrhea, nausea and vomiting.  Genitourinary: Negative for difficulty urinating and dysuria.  Musculoskeletal: Negative for joint swelling and myalgias.  Skin: Negative for color change and  rash.  Neurological: Negative for dizziness, light-headedness and headaches.  Psychiatric/Behavioral: Negative for agitation and dysphoric mood.       Objective:    Physical Exam Vitals reviewed.  Constitutional:      General: She is not in acute distress.    Appearance: Normal appearance.  HENT:     Head: Normocephalic and atraumatic.     Right Ear: External ear normal.     Left Ear: External ear normal.  Eyes:     General: No scleral icterus.       Right eye: No discharge.        Left eye: No discharge.     Conjunctiva/sclera: Conjunctivae normal.  Neck:     Thyroid: No thyromegaly.  Cardiovascular:     Rate and Rhythm: Normal rate and regular rhythm.  Pulmonary:     Effort: No respiratory distress.     Breath sounds: Normal breath sounds. No wheezing.  Abdominal:     General: Bowel sounds are normal.     Palpations: Abdomen is soft.     Tenderness: There is no abdominal tenderness.  Musculoskeletal:        General: No swelling or tenderness.     Cervical back: Neck supple. No tenderness.  Lymphadenopathy:     Cervical: No cervical adenopathy.  Skin:    Findings: No erythema or rash.  Neurological:     Mental Status: She is alert.  Psychiatric:        Mood and Affect: Mood normal.        Behavior: Behavior normal.     BP 122/70   Pulse 90   Temp 97.8 F (36.6 C) (Oral)   Resp 16   Ht 5\' 5"  (1.651 m)   Wt 223 lb (101.2 kg)   LMP 11/06/2000   SpO2 98%   BMI 37.11 kg/m  Wt Readings from Last 3 Encounters:  11/19/20 223 lb (101.2 kg)  07/21/20 226 lb (102.5 kg)  03/04/20 233 lb (105.7 kg)     Lab Results  Component Value Date   WBC 5.7 01/08/2020   HGB 13.4 01/08/2020   HCT 41.1 01/08/2020   PLT 463 (H) 01/08/2020   GLUCOSE 93 03/04/2020   CHOL 201 (H) 03/04/2020   TRIG 88.0 03/04/2020   HDL 68.10 03/04/2020   LDLDIRECT 164.5 07/24/2013   LDLCALC 116 (H) 03/04/2020   ALT 18 01/08/2020   AST 17 01/08/2020   NA 138 03/04/2020   K 3.8  03/04/2020   CL 103 03/04/2020   CREATININE 0.70 03/04/2020   BUN 8 03/04/2020   CO2 27 03/04/2020   TSH 1.55 07/10/2019   INR 1.0 04/21/2014    MM 3D SCREEN BREAST BILATERAL  Result Date: 01/30/2020 CLINICAL DATA:  Screening. EXAM: DIGITAL SCREENING BILATERAL MAMMOGRAM WITH TOMO AND CAD COMPARISON:  Previous exam(s). ACR Breast Density Category a: The breast tissue is almost entirely fatty. FINDINGS: There are no findings suspicious for malignancy. Images were processed with CAD. IMPRESSION: No mammographic evidence of malignancy. A result letter of this screening mammogram will be mailed directly to the patient. RECOMMENDATION: Screening mammogram in one year. (Code:SM-B-01Y) BI-RADS CATEGORY  1: Negative. Electronically Signed   By: Margarette Canada M.D.   On: 01/30/2020 12:22       Assessment & Plan:   Problem List Items Addressed This Visit    Anemia    Followed by hematology.       Esophageal reflux    On protonix.  No upper symptoms reported.        Essential hypertension, benign    Continue plendil and triam/hctz.  Blood pressure doing well.  Follow pressures.  Follow metabolic panel.       Lung nodule, solitary    Seeing hematology.  CT chest - denied by insurance.  Had cxr.  Followed by hematology.       MGUS (monoclonal gammopathy of unknown significance)    Followed by hematology.  Stable.        Neck nodule    Followed by ENT.  Just evaluated.  Recommended continued monitoring - one year.        Pure hypercholesterolemia    On lipitor.  Low cholesterol diet and exercise.  Follow lipid panel and liver function tests.        Stress    Increased stress as outlined.  Discussed.  Does not feel needs any further intervention.  Follow.            Einar Pheasant, MD

## 2020-11-22 ENCOUNTER — Encounter: Payer: Self-pay | Admitting: Internal Medicine

## 2020-11-22 NOTE — Assessment & Plan Note (Signed)
Followed by hematology 

## 2020-11-22 NOTE — Assessment & Plan Note (Signed)
Seeing hematology.  CT chest - denied by insurance.  Had cxr.  Followed by hematology.

## 2020-11-22 NOTE — Assessment & Plan Note (Addendum)
Continue plendil and triam/hctz.  Blood pressure doing well.  Follow pressures.  Follow metabolic panel.  

## 2020-11-22 NOTE — Assessment & Plan Note (Signed)
On protonix.  No upper symptoms reported.   

## 2020-11-22 NOTE — Assessment & Plan Note (Signed)
Followed by ENT.  Just evaluated.  Recommended continued monitoring - one year.   

## 2020-11-22 NOTE — Assessment & Plan Note (Signed)
Increased stress as outlined.  Discussed.  Does not feel needs any further intervention.  Follow.  

## 2020-11-22 NOTE — Assessment & Plan Note (Signed)
On lipitor.  Low cholesterol diet and exercise.  Follow lipid panel and liver function tests.   

## 2020-11-22 NOTE — Assessment & Plan Note (Signed)
Followed by hematology. Stable.  

## 2020-11-25 ENCOUNTER — Telehealth (INDEPENDENT_AMBULATORY_CARE_PROVIDER_SITE_OTHER): Payer: BC Managed Care – PPO | Admitting: Family

## 2020-11-25 ENCOUNTER — Encounter: Payer: Self-pay | Admitting: Family

## 2020-11-25 VITALS — Ht 65.0 in | Wt 223.0 lb

## 2020-11-25 DIAGNOSIS — R059 Cough, unspecified: Secondary | ICD-10-CM | POA: Diagnosis not present

## 2020-11-25 MED ORDER — BENZONATATE 100 MG PO CAPS: 100.0000 mg | ORAL_CAPSULE | Freq: Three times a day (TID) | ORAL | 1 refills | Status: DC | PRN

## 2020-11-25 MED ORDER — FLUTICASONE PROPIONATE 50 MCG/ACT NA SUSP: 2.0000 | Freq: Every day | NASAL | 6 refills | Status: DC

## 2020-11-25 NOTE — Progress Notes (Signed)
Verbal consent for services obtained from patient prior to services given to TELEPHONE visit:   Location of call:  provider at work patient at home  Names of all persons present for services: Kathleen Paris, NP and patient  Acute visit  Cough and thick and discolored congestion x 3 days, worsening.  Endorses ears are full.  No fever, sob, sinus pain,  sore throat.  Has started mucinex, robitussin DM with some relief Covid vaccinated with booster No recent antibiotics.  H /o MGUS, HTN, GERD Works in school system Non smoker. No h/o lung disease She is not allergic to amoxicillin and in the past.  A/P/next steps:  Problem List Items Addressed This Visit      Other   Cough - Primary    Duration of 3 days. Afebrile.  Discussed likely viral in nature. Advised covid test at home tomorrow ( day 4) for most accuracy. Advised tessalon, mucinex for conservative treatment. Discussed augmentin if symptoms fail to improve with conservative therapy, she will let me know how she is doing.       Relevant Medications   benzonatate (TESSALON PERLES) 100 MG capsule   fluticasone (FLONASE) 50 MCG/ACT nasal spray       I spent 15 min  discussing plan of care over the phone.

## 2020-11-25 NOTE — Assessment & Plan Note (Addendum)
Duration of 3 days. Afebrile.  Discussed likely viral in nature. Advised covid test at home tomorrow ( day 4) for most accuracy. Advised tessalon, mucinex for conservative treatment. Discussed augmentin if symptoms fail to improve with conservative therapy, she will let me know how she is doing.

## 2020-11-27 ENCOUNTER — Telehealth: Payer: Self-pay

## 2020-11-27 DIAGNOSIS — R059 Cough, unspecified: Secondary | ICD-10-CM

## 2020-11-27 MED ORDER — AMOXICILLIN-POT CLAVULANATE 875-125 MG PO TABS
1.0000 | ORAL_TABLET | Freq: Two times a day (BID) | ORAL | 0 refills | Status: AC
Start: 2020-11-27 — End: 2020-12-04

## 2020-11-27 NOTE — Telephone Encounter (Signed)
LM giving instructions on antibiotic that was sent. I asked her to call back so I can advise on daily yogurt with antibiotic or taking a probiotic.

## 2020-11-27 NOTE — Telephone Encounter (Signed)
Pt saw you on 3/30. Discussed augmentin if her symptoms did not improve. Are you ok with sending in for her?

## 2020-11-27 NOTE — Telephone Encounter (Signed)
Call pt I have seen in antibiotic, augmentin Remind her this is related to amoxicillin which she told me she is NOT allergic too.   Ensure to take probiotics while on antibiotics and also for 2 weeks after completion. This can either be by eating yogurt daily or taking a probiotic supplement over the counter such as Culturelle.It is important to re-colonize the gut with good bacteria and also to prevent any diarrheal infections associated with antibiotic use.

## 2020-11-27 NOTE — Telephone Encounter (Signed)
Pt called and advised Augmentin was sent & she should take probiotic while taking & after. Pt stated that she would pick up at pharmacy when she gets script.

## 2020-11-27 NOTE — Telephone Encounter (Signed)
Pt called and states that PCP told her that if her allergies did not get any better, she would call her in an antibiotic. She states that she is still very congested. Please advise

## 2020-11-27 NOTE — Telephone Encounter (Signed)
Patient returned office phone call from Sarah 

## 2020-12-15 ENCOUNTER — Other Ambulatory Visit (INDEPENDENT_AMBULATORY_CARE_PROVIDER_SITE_OTHER): Payer: BC Managed Care – PPO

## 2020-12-15 ENCOUNTER — Other Ambulatory Visit: Payer: Self-pay

## 2020-12-15 DIAGNOSIS — E7800 Pure hypercholesterolemia, unspecified: Secondary | ICD-10-CM

## 2020-12-15 DIAGNOSIS — I10 Essential (primary) hypertension: Secondary | ICD-10-CM

## 2020-12-15 LAB — BASIC METABOLIC PANEL
BUN: 9 mg/dL (ref 6–23)
CO2: 31 mEq/L (ref 19–32)
Calcium: 10 mg/dL (ref 8.4–10.5)
Chloride: 101 mEq/L (ref 96–112)
Creatinine, Ser: 0.73 mg/dL (ref 0.40–1.20)
GFR: 91.84 mL/min (ref >60.00)
Glucose, Bld: 79 mg/dL (ref 70–99)
Potassium: 4.1 mEq/L (ref 3.5–5.1)
Sodium: 139 mEq/L (ref 135–145)

## 2020-12-15 LAB — HEPATIC FUNCTION PANEL
ALT: 13 U/L (ref 0–35)
AST: 12 U/L (ref 0–37)
Albumin: 4 g/dL (ref 3.5–5.2)
Alkaline Phosphatase: 72 U/L (ref 39–117)
Bilirubin, Direct: 0.1 mg/dL (ref 0.0–0.3)
Total Bilirubin: 0.9 mg/dL (ref 0.2–1.2)
Total Protein: 7.4 g/dL (ref 6.0–8.3)

## 2020-12-15 LAB — LIPID PANEL
Cholesterol: 218 mg/dL — ABNORMAL HIGH (ref 0–200)
HDL: 74.1 mg/dL (ref >39.00)
LDL Cholesterol: 129 mg/dL — ABNORMAL HIGH (ref 0–99)
NonHDL: 144.35
Total CHOL/HDL Ratio: 3
Triglycerides: 79 mg/dL (ref 0.0–149.0)
VLDL: 15.8 mg/dL (ref 0.0–40.0)

## 2020-12-15 LAB — TSH: TSH: 1.97 u[IU]/mL (ref 0.35–4.50)

## 2020-12-25 ENCOUNTER — Other Ambulatory Visit: Payer: Self-pay | Admitting: Internal Medicine

## 2020-12-25 DIAGNOSIS — Z1231 Encounter for screening mammogram for malignant neoplasm of breast: Secondary | ICD-10-CM

## 2021-01-06 ENCOUNTER — Other Ambulatory Visit: Payer: Self-pay

## 2021-01-06 DIAGNOSIS — D472 Monoclonal gammopathy: Secondary | ICD-10-CM

## 2021-01-07 ENCOUNTER — Inpatient Hospital Stay: Payer: BC Managed Care – PPO | Attending: Nurse Practitioner | Admitting: Nurse Practitioner

## 2021-01-07 ENCOUNTER — Inpatient Hospital Stay: Payer: BC Managed Care – PPO

## 2021-01-07 ENCOUNTER — Encounter: Payer: Self-pay | Admitting: Nurse Practitioner

## 2021-01-07 ENCOUNTER — Other Ambulatory Visit: Payer: Self-pay

## 2021-01-07 VITALS — BP 141/71 | HR 71 | Temp 97.8°F | Resp 20 | Wt 227.1 lb

## 2021-01-07 DIAGNOSIS — D472 Monoclonal gammopathy: Secondary | ICD-10-CM | POA: Diagnosis present

## 2021-01-07 LAB — CBC WITH DIFFERENTIAL/PLATELET
Abs Immature Granulocytes: 0.01 10*3/uL (ref 0.00–0.07)
Basophils Absolute: 0 10*3/uL (ref 0.0–0.1)
Basophils Relative: 1 %
Eosinophils Absolute: 0.3 10*3/uL (ref 0.0–0.5)
Eosinophils Relative: 6 %
HCT: 38.5 % (ref 36.0–46.0)
Hemoglobin: 12.3 g/dL (ref 12.0–15.0)
Immature Granulocytes: 0 %
Lymphocytes Relative: 39 %
Lymphs Abs: 2.2 10*3/uL (ref 0.7–4.0)
MCH: 27.8 pg (ref 26.0–34.0)
MCHC: 31.9 g/dL (ref 30.0–36.0)
MCV: 86.9 fL (ref 80.0–100.0)
Monocytes Absolute: 0.3 10*3/uL (ref 0.1–1.0)
Monocytes Relative: 6 %
Neutro Abs: 2.7 10*3/uL (ref 1.7–7.7)
Neutrophils Relative %: 48 %
Platelets: 442 10*3/uL — ABNORMAL HIGH (ref 150–400)
RBC: 4.43 MIL/uL (ref 3.87–5.11)
RDW: 14.2 % (ref 11.5–15.5)
WBC: 5.7 10*3/uL (ref 4.0–10.5)
nRBC: 0 % (ref 0.0–0.2)

## 2021-01-07 LAB — COMPREHENSIVE METABOLIC PANEL
ALT: 16 U/L (ref 0–44)
AST: 14 U/L — ABNORMAL LOW (ref 15–41)
Albumin: 4.1 g/dL (ref 3.5–5.0)
Alkaline Phosphatase: 60 U/L (ref 38–126)
Anion gap: 5 (ref 5–15)
BUN: 16 mg/dL (ref 6–20)
CO2: 27 mmol/L (ref 22–32)
Calcium: 9.4 mg/dL (ref 8.9–10.3)
Chloride: 105 mmol/L (ref 98–111)
Creatinine, Ser: 0.72 mg/dL (ref 0.44–1.00)
GFR, Estimated: >60 mL/min (ref >60)
Glucose, Bld: 84 mg/dL (ref 70–99)
Potassium: 3.8 mmol/L (ref 3.5–5.1)
Sodium: 137 mmol/L (ref 135–145)
Total Bilirubin: 0.5 mg/dL (ref 0.3–1.2)
Total Protein: 7.9 g/dL (ref 6.5–8.1)

## 2021-01-07 NOTE — Progress Notes (Signed)
[facility]  [address], [address] [place], [place] [address] Phone: [phone]  Fax: [phone]   Clinic Day:  01/07/2021  Referring physician: Einar Pheasant, MD  Chief Complaint: Kathleen Gould is a 55 y.o. female with a monoclonal gammopathy of unknown significance (MGUS) who is seen for a new patient assessment  Heme/Onc History: Kathleen Gould is a 55 y.o. 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 Bone survey on 04/21/2014 revealed no blastic or lytic lesions.   M-spike (gm/dL) followed: 0.44 on 03/03/2014, 0.3 on 04/30/2015, 0.3 on 11/15/2016, 0.4 on 11/14/2017, and 0.2 on 12/05/2018.  Kappa/lambda free light chains and ratio have remained normal from 04/21/2014 - 12/05/2018.   She was noted to have an incidental RML lung nodule 4 mm on chest CT from 02/15/2017.  CXR on 03/27/2018 and 10/25/2018 revealed no acute cardiopulmonary disease and a stable examination. Chest CT was previously denied.   She received her last COVID-19 Moderna vaccine on 11/21/2019.   HPI: Patient with history of MGUS, currently on surveillance, returns to clinic for annual follow-up and reevaluation.  She continues to feel well.  She has had an interval colonoscopy.  No new lumps or bumps. Denies any neurologic complaints. Denies recent fevers or illnesses. Denies any easy bleeding or bruising. No melena or hematochezia.  No bone pain.  Reports good appetite and denies weight loss. Denies chest pain. Denies any nausea, vomiting, constipation, or diarrhea. Denies urinary complaints. Patient offers no further specific complaints today.    Past Medical History:  Diagnosis Date   Arthritis    Endometriosis    Environmental allergies    GERD (gastroesophageal reflux disease)    Hypercholesterolemia    Hypertension    Uterine fibroid     Past Surgical History:  Procedure Laterality Date   ABDOMINAL  HYSTERECTOMY     CESAREAN SECTION     x2   OOPHORECTOMY     S/P left secondary to cyst   THYROGLOSSAL DUCT CYST N/A 05/27/2015   Procedure: THYROGLOSSAL DUCT CYST EXCISION ;  Surgeon: Clyde Canterbury, MD;  Location: [facility] ORS;  Service: ENT;  Laterality: N/A;    Family History  Problem Relation Age of Onset   Heart attack Father 70   Heart disease Father        bypass surgery   Hypertension Father    Hypercholesterolemia Father    Kidney disease Father    Diabetes Mother    Diabetes Other        aunt   Breast cancer Neg Hx     Social History:  reports that she has never smoked. She has never used smokeless tobacco. She reports that she does not drink alcohol and does not use drugs. Her sister has ITP. Her father has arthritis. She is a Technical sales engineer.  She lives in [place].  The patient is alone today.   Allergies:  Allergies  Allergen Reactions   Cefdinir Itching   Levofloxacin Other (See Comments)    Joint pain   Simvastatin Other (See Comments)    "muscle aches"    Current Medications: Current Outpatient Medications  Medication Sig Dispense Refill   atorvastatin (LIPITOR) 20 MG tablet TAKE 1 TABLET BY MOUTH EVERY DAY 90 tablet 3   benzonatate (TESSALON PERLES) 100 MG capsule Take 1 capsule (100 mg total) by mouth 3 (three) times daily as needed for cough. 30 capsule 1   felodipine (PLENDIL) 5  MG 24 hr tablet TAKE 1 TABLET BY MOUTH EVERY DAY 90 tablet 1   fluticasone (FLONASE) 50 MCG/ACT nasal spray Place 2 sprays into both nostrils daily. 16 g 6   pantoprazole (PROTONIX) 40 MG tablet TAKE 1 TABLET BY MOUTH EVERY DAY 90 tablet 1   triamterene-hydrochlorothiazide (MAXZIDE-25) 37.5-25 MG tablet TAKE 0.5 TABLETS BY MOUTH DAILY. 30 tablet 5   No current facility-administered medications for this visit.    Review of Systems  Constitutional:  Negative for chills, fever, malaise/fatigue and weight loss.  HENT:  Negative for hearing loss, nosebleeds, sore throat and  tinnitus.   Eyes:  Negative for blurred vision and double vision.  Respiratory:  Negative for cough, hemoptysis, shortness of breath and wheezing.   Cardiovascular:  Negative for chest pain, palpitations and leg swelling.  Gastrointestinal:  Negative for abdominal pain, blood in stool, constipation, diarrhea, melena, nausea and vomiting.  Genitourinary:  Negative for dysuria and urgency.  Musculoskeletal:  Negative for back pain, falls, joint pain and myalgias.  Skin:  Negative for itching and rash.  Neurological:  Negative for dizziness, tingling, sensory change, loss of consciousness, weakness and headaches.  Endo/Heme/Allergies:  Negative for environmental allergies. Does not bruise/bleed easily.  Psychiatric/Behavioral:  Negative for depression. The patient is not nervous/anxious and does not have insomnia.   Performance status (ECOG): 0  Vitals Blood pressure (!) 141/71, pulse 71, temperature 97.8 F (36.6 C), resp. rate 20, weight 227 lb 1.2 oz (103 kg), last menstrual period 11/06/2000, SpO2 100 %.   General: Well-developed, well-nourished, no acute distress. Eyes: Pink conjunctiva, anicteric sclera. Lungs: Clear to auscultation bilaterally.  No audible wheezing or coughing Heart: Regular rate and rhythm.  Abdomen: Soft, nontender, nondistended.  Musculoskeletal: No edema, cyanosis, or clubbing. Neuro: Alert, answering all questions appropriately. Cranial nerves grossly intact. Skin: No rashes or petechiae noted. Psych: Normal affect.   Appointment on 01/07/2021  Component Date Value Ref Range Status   WBC 01/07/2021 5.7  4.0 - 10.5 K/uL Final   RBC 01/07/2021 4.43  3.87 - 5.11 MIL/uL Final   Hemoglobin 01/07/2021 12.3  12.0 - 15.0 g/dL Final   HCT 01/07/2021 38.5  36.0 - 46.0 % Final   MCV 01/07/2021 86.9  80.0 - 100.0 fL Final   MCH 01/07/2021 27.8  26.0 - 34.0 pg Final   MCHC 01/07/2021 31.9  30.0 - 36.0 g/dL Final   RDW 01/07/2021 14.2  11.5 - 15.5 % Final   Platelets  01/07/2021 442* 150 - 400 K/uL Final   nRBC 01/07/2021 0.0  0.0 - 0.2 % Final   Neutrophils Relative % 01/07/2021 48  % Final   Neutro Abs 01/07/2021 2.7  1.7 - 7.7 K/uL Final   Lymphocytes Relative 01/07/2021 39  % Final   Lymphs Abs 01/07/2021 2.2  0.7 - 4.0 K/uL Final   Monocytes Relative 01/07/2021 6  % Final   Monocytes Absolute 01/07/2021 0.3  0.1 - 1.0 K/uL Final   Eosinophils Relative 01/07/2021 6  % Final   Eosinophils Absolute 01/07/2021 0.3  0.0 - 0.5 K/uL Final   Basophils Relative 01/07/2021 1  % Final   Basophils Absolute 01/07/2021 0.0  0.0 - 0.1 K/uL Final   Immature Granulocytes 01/07/2021 0  % Final   Abs Immature Granulocytes 01/07/2021 0.01  0.00 - 0.07 K/uL Final   Performed at [facility], [address] [place], [place] [address]    Assessment:   1.   Monoclonal gammopathy of unknown significance (  MGUS)- M spike 0.4%. Stable. Elevated kappa free light chains. Clinically asymptomatic. Does not meet crab criteria. No evidence of renal disease. Risk of transformation to myeloma or a lymphoproliferative disorder 1%/year.  2.   RML lung nodule  Patient has a history of 4 mm RML nodule since 02/15/2017.  She was felt to be at low risk and therefore no f/u was recommended.   3.   SQ nodule below angle of jaw  Followed by ENT. Monitor.    4.   Health maintenance  Colonoscopy performed 08/19/20 by Dr. Alice Reichert.   Other health screening managed by PCP  Plan:  1 year for MD assessment to establish care and labs (CBC with diff, CMP, SPEP, FLCA).   I discussed the assessment and treatment plan with the patient.  The patient was provided an opportunity to ask questions and all were answered.  The patient agreed with the plan and demonstrated an understanding of the instructions.  The patient was advised to call back if the symptoms worsen or if the condition fails to improve as anticipated.  Beckey Rutter, DNP, AGNP-C [place] [facility] [phone] (clinic)

## 2021-01-08 LAB — KAPPA/LAMBDA LIGHT CHAINS
Kappa free light chain: 23.2 mg/L — ABNORMAL HIGH (ref 3.3–19.4)
Kappa, lambda light chain ratio: 1.63 (ref 0.26–1.65)
Lambda free light chains: 14.2 mg/L (ref 5.7–26.3)

## 2021-01-11 LAB — PROTEIN ELECTROPHORESIS, SERUM
A/G Ratio: 1.1 (ref 0.7–1.7)
Albumin ELP: 3.8 g/dL (ref 2.9–4.4)
Alpha-1-Globulin: 0.2 g/dL (ref 0.0–0.4)
Alpha-2-Globulin: 0.6 g/dL (ref 0.4–1.0)
Beta Globulin: 1.2 g/dL (ref 0.7–1.3)
Gamma Globulin: 1.3 g/dL (ref 0.4–1.8)
Globulin, Total: 3.4 g/dL (ref 2.2–3.9)
M-Spike, %: 0.4 g/dL — ABNORMAL HIGH
Total Protein ELP: 7.2 g/dL (ref 6.0–8.5)

## 2021-01-14 ENCOUNTER — Telehealth: Payer: Self-pay | Admitting: *Deleted

## 2021-01-14 NOTE — Telephone Encounter (Signed)
Unlikely related to her diagnosis. Recommend she see her PCP.

## 2021-01-14 NOTE — Telephone Encounter (Signed)
Patient called reporting that she has developed numbness in her finger 5 days ago and is asking if she needs to be seen for this

## 2021-01-15 NOTE — Telephone Encounter (Signed)
I called patient and got her voice mail, left non patient identifying message that she needs to contact her PCP

## 2021-02-02 ENCOUNTER — Ambulatory Visit
Admission: RE | Admit: 2021-02-02 | Discharge: 2021-02-02 | Disposition: A | Discharge status: Home / Self Care | Payer: BC Managed Care – PPO | Source: Ambulatory Visit | Attending: Internal Medicine | Admitting: Internal Medicine

## 2021-02-02 ENCOUNTER — Other Ambulatory Visit: Payer: Self-pay

## 2021-02-02 DIAGNOSIS — Z1231 Encounter for screening mammogram for malignant neoplasm of breast: Secondary | ICD-10-CM | POA: Insufficient documentation

## 2021-03-10 ENCOUNTER — Other Ambulatory Visit: Payer: Self-pay | Admitting: Internal Medicine

## 2021-03-10 DIAGNOSIS — K219 Gastro-esophageal reflux disease without esophagitis: Secondary | ICD-10-CM

## 2021-03-23 ENCOUNTER — Other Ambulatory Visit: Payer: Self-pay

## 2021-03-23 ENCOUNTER — Other Ambulatory Visit (HOSPITAL_COMMUNITY)
Admission: RE | Admit: 2021-03-23 | Discharge: 2021-03-23 | Disposition: A | Discharge status: Home / Self Care | Payer: BC Managed Care – PPO | Source: Ambulatory Visit | Attending: Internal Medicine | Admitting: Internal Medicine

## 2021-03-23 ENCOUNTER — Ambulatory Visit (INDEPENDENT_AMBULATORY_CARE_PROVIDER_SITE_OTHER): Payer: BC Managed Care – PPO | Admitting: Internal Medicine

## 2021-03-23 VITALS — BP 118/70 | HR 76 | Temp 96.9°F | Resp 16 | Ht 65.0 in | Wt 219.6 lb

## 2021-03-23 DIAGNOSIS — K219 Gastro-esophageal reflux disease without esophagitis: Secondary | ICD-10-CM

## 2021-03-23 DIAGNOSIS — F439 Reaction to severe stress, unspecified: Secondary | ICD-10-CM

## 2021-03-23 DIAGNOSIS — D472 Monoclonal gammopathy: Secondary | ICD-10-CM

## 2021-03-23 DIAGNOSIS — I10 Essential (primary) hypertension: Secondary | ICD-10-CM

## 2021-03-23 DIAGNOSIS — Z0000 Encounter for general adult medical examination without abnormal findings: Secondary | ICD-10-CM | POA: Diagnosis not present

## 2021-03-23 DIAGNOSIS — Z124 Encounter for screening for malignant neoplasm of cervix: Secondary | ICD-10-CM

## 2021-03-23 DIAGNOSIS — R911 Solitary pulmonary nodule: Secondary | ICD-10-CM

## 2021-03-23 DIAGNOSIS — E7800 Pure hypercholesterolemia, unspecified: Secondary | ICD-10-CM

## 2021-03-23 DIAGNOSIS — R221 Localized swelling, mass and lump, neck: Secondary | ICD-10-CM

## 2021-03-23 DIAGNOSIS — D649 Anemia, unspecified: Secondary | ICD-10-CM

## 2021-03-23 LAB — BASIC METABOLIC PANEL
BUN: 8 mg/dL (ref 6–23)
CO2: 30 mEq/L (ref 19–32)
Calcium: 10.1 mg/dL (ref 8.4–10.5)
Chloride: 101 mEq/L (ref 96–112)
Creatinine, Ser: 0.73 mg/dL (ref 0.40–1.20)
GFR: 91.66 mL/min (ref >60.00)
Glucose, Bld: 84 mg/dL (ref 70–99)
Potassium: 3.8 mEq/L (ref 3.5–5.1)
Sodium: 139 mEq/L (ref 135–145)

## 2021-03-23 LAB — LIPID PANEL
Cholesterol: 210 mg/dL — ABNORMAL HIGH (ref 0–200)
HDL: 68.2 mg/dL (ref >39.00)
LDL Cholesterol: 128 mg/dL — ABNORMAL HIGH (ref 0–99)
NonHDL: 142.19
Total CHOL/HDL Ratio: 3
Triglycerides: 70 mg/dL (ref 0.0–149.0)
VLDL: 14 mg/dL (ref 0.0–40.0)

## 2021-03-23 LAB — HEPATIC FUNCTION PANEL
ALT: 12 U/L (ref 0–35)
AST: 14 U/L (ref 0–37)
Albumin: 4.5 g/dL (ref 3.5–5.2)
Alkaline Phosphatase: 64 U/L (ref 39–117)
Bilirubin, Direct: 0.1 mg/dL (ref 0.0–0.3)
Total Bilirubin: 0.7 mg/dL (ref 0.2–1.2)
Total Protein: 7.7 g/dL (ref 6.0–8.3)

## 2021-03-23 NOTE — Progress Notes (Signed)
Patient ID: Kathleen Gould, female   DOB: 12-21-1965, 55 y.o.   MRN: [id number]   Subjective:    Patient ID: Kathleen Gould, female    DOB: [birth year], 55 y.o.   MRN: [id number]  HPI This visit occurred during the SARS-CoV-2 public health emergency.  Safety protocols were in place, including screening questions prior to the visit, additional usage of staff PPE, and extensive cleaning of exam room while observing appropriate contact time as indicated for disinfecting solutions.   Patient here for her physical exam.  She reports increased stress.  Her mother is living with her and she is taking care of her.  Discussed the increased stress.  She does not feel needs any further intervention at this time.  No chest pain or sob reported.  No acid reflux.  No abdominal pain or bowel change reported.  Physical therapy - referred by Dr Sarajane Jews - for evaluation neck pain traveling down left arm.  Seeing hematology for f/u MGUS.  Evaluated 01/07/21 - stable.   Recommended f/u in one year.   Past Medical History:  Diagnosis Date   Arthritis    Endometriosis    Environmental allergies    GERD (gastroesophageal reflux disease)    Hypercholesterolemia    Hypertension    Uterine fibroid    Past Surgical History:  Procedure Laterality Date   ABDOMINAL HYSTERECTOMY     CESAREAN SECTION     x2   OOPHORECTOMY     S/P left secondary to cyst   THYROGLOSSAL DUCT CYST N/A 05/27/2015   Procedure: THYROGLOSSAL DUCT CYST EXCISION ;  Surgeon: Clyde Canterbury, MD;  Location: [facility] ORS;  Service: ENT;  Laterality: N/A;   Family History  Problem Relation Age of Onset   Heart attack Father 52   Heart disease Father        bypass surgery   Hypertension Father    Hypercholesterolemia Father    Kidney disease Father    Diabetes Mother    Diabetes Other        aunt   Breast cancer Neg Hx    Social History   Socioeconomic History   Marital status: Married    Spouse name: Not on file   Number of  children: 2   Years of education: Not on file   Highest education level: Not on file  Occupational History   Not on file  Tobacco Use   Smoking status: Never   Smokeless tobacco: Never  Vaping Use   Vaping Use: Never used  Substance and Sexual Activity   Alcohol use: No    Alcohol/week: 0.0 standard drinks   Drug use: No   Sexual activity: Not Currently  Other Topics Concern   Not on file  Social History Narrative   Widowed    Social Determinants of Health   Financial Resource Strain: Not on file  Food Insecurity: Not on file  Transportation Needs: Not on file  Physical Activity: Not on file  Stress: Not on file  Social Connections: Not on file    Review of Systems  Constitutional:  Negative for appetite change and unexpected weight change.  HENT:  Negative for congestion, sinus pressure and sore throat.   Eyes:  Negative for pain and visual disturbance.  Respiratory:  Negative for cough, chest tightness and shortness of breath.   Cardiovascular:  Negative for chest pain, palpitations and leg swelling.  Gastrointestinal:  Negative for abdominal pain, diarrhea, nausea and vomiting.  Genitourinary:  Negative for  difficulty urinating and dysuria.  Musculoskeletal:  Negative for joint swelling and myalgias.  Skin:  Negative for color change and rash.  Neurological:  Negative for dizziness, light-headedness and headaches.  Hematological:  Negative for adenopathy. Does not bruise/bleed easily.  Psychiatric/Behavioral:  Negative for agitation and dysphoric mood.       Objective:    Physical Exam Vitals reviewed.  Constitutional:      General: She is not in acute distress.    Appearance: Normal appearance. She is well-developed.  HENT:     Head: Normocephalic and atraumatic.     Right Ear: External ear normal.     Left Ear: External ear normal.  Eyes:     General: No scleral icterus.       Right eye: No discharge.        Left eye: No discharge.      Conjunctiva/sclera: Conjunctivae normal.  Neck:     Thyroid: No thyromegaly.  Cardiovascular:     Rate and Rhythm: Normal rate and regular rhythm.  Pulmonary:     Effort: No tachypnea, accessory muscle usage or respiratory distress.     Breath sounds: Normal breath sounds. No decreased breath sounds, wheezing or rhonchi.  Chest:  Breasts:    Right: No inverted nipple, mass, nipple discharge or tenderness (no axillary adenopathy).     Left: No inverted nipple, mass, nipple discharge or tenderness (no axilarry adenopathy).  Abdominal:     General: Bowel sounds are normal.     Palpations: Abdomen is soft.     Tenderness: There is no abdominal tenderness.  Genitourinary:    Comments: Normal external genitalia.  Vaginal vault without lesions.  Cervix identified.  Pap smear performed.  Could not appreciate any adnexal masses or tenderness.   Musculoskeletal:        General: No swelling or tenderness.     Cervical back: Neck supple.  Lymphadenopathy:     Cervical: No cervical adenopathy.  Skin:    General: Skin is warm.     Findings: No erythema or rash.  Neurological:     Mental Status: She is alert and oriented to person, place, and time.  Psychiatric:        Mood and Affect: Mood normal.        Behavior: Behavior normal.    BP 118/70   Pulse 76   Temp (!) 96.9 F (36.1 C)   Resp 16   Ht '5\' 5"'$  (1.651 m)   Wt 219 lb 9.6 oz (99.6 kg)   LMP 11/06/2000   SpO2 98%   BMI 36.54 kg/m  Wt Readings from Last 3 Encounters:  03/23/21 219 lb 9.6 oz (99.6 kg)  01/07/21 227 lb 1.2 oz (103 kg)  11/25/20 223 lb (101.2 kg)    Outpatient Encounter Medications as of 03/23/2021  Medication Sig   atorvastatin (LIPITOR) 20 MG tablet TAKE 1 TABLET BY MOUTH EVERY DAY   felodipine (PLENDIL) 5 MG 24 hr tablet TAKE 1 TABLET BY MOUTH EVERY DAY   fluticasone (FLONASE) 50 MCG/ACT nasal spray Place 2 sprays into both nostrils daily.   pantoprazole (PROTONIX) 40 MG tablet TAKE 1 TABLET BY MOUTH EVERY  DAY   triamterene-hydrochlorothiazide (MAXZIDE-25) 37.5-25 MG tablet TAKE 0.5 TABLETS BY MOUTH DAILY.   [DISCONTINUED] benzonatate (TESSALON PERLES) 100 MG capsule Take 1 capsule (100 mg total) by mouth 3 (three) times daily as needed for cough. (Patient not taking: Reported on 03/23/2021)   No facility-administered encounter medications on file as of  03/23/2021.     Lab Results  Component Value Date   WBC 5.7 01/07/2021   HGB 12.3 01/07/2021   HCT 38.5 01/07/2021   PLT 442 (H) 01/07/2021   GLUCOSE 84 03/23/2021   CHOL 210 (H) 03/23/2021   TRIG 70.0 03/23/2021   HDL 68.20 03/23/2021   LDLDIRECT 164.5 07/24/2013   LDLCALC 128 (H) 03/23/2021   ALT 12 03/23/2021   AST 14 03/23/2021   NA 139 03/23/2021   K 3.8 03/23/2021   CL 101 03/23/2021   CREATININE 0.73 03/23/2021   BUN 8 03/23/2021   CO2 30 03/23/2021   TSH 1.97 12/15/2020   INR 1.0 04/21/2014    MM 3D SCREEN BREAST BILATERAL  Result Date: 02/04/2021 CLINICAL DATA:  Screening. EXAM: DIGITAL SCREENING BILATERAL MAMMOGRAM WITH TOMOSYNTHESIS AND CAD TECHNIQUE: Bilateral screening digital craniocaudal and mediolateral oblique mammograms were obtained. Bilateral screening digital breast tomosynthesis was performed. The images were evaluated with computer-aided detection. COMPARISON:  Previous exam(s). ACR Breast Density Category b: There are scattered areas of fibroglandular density. FINDINGS: There are no findings suspicious for malignancy. The images were evaluated with computer-aided detection. IMPRESSION: No mammographic evidence of malignancy. A result letter of this screening mammogram will be mailed directly to the patient. RECOMMENDATION: Screening mammogram in one year. (Code:SM-B-01Y) BI-RADS CATEGORY  1: Negative. Electronically Signed   By: Valentino Saxon MD   On: 02/04/2021 09:41      Assessment & Plan:   Problem List Items Addressed This Visit     Anemia    Followed by hematology.         Esophageal reflux     On protonix.  No upper symptoms reported.         Essential hypertension, benign    Continue plendil and triam/hctz.  Blood pressure doing well.  Follow pressures.  Follow metabolic panel.        Health care maintenance    Physical today 03/23/21.  PAP 03/23/21.  Mammogram 02/04/21 - Birads I.  Colonoscopy 07/2020.  Obtain results.         Lung nodule, solitary    Followed by hematology.  No further evaluation warranted.  See note.        MGUS (monoclonal gammopathy of unknown significance)    Followed by hematology.  Stable.         Neck nodule    Followed by ENT.  Just evaluated.  Recommended continued monitoring - one year.         Pure hypercholesterolemia    On lipitor.  Low cholesterol diet and exercise.  Follow lipid panel and liver function tests.         Relevant Orders   Basic metabolic panel (Completed)   Lipid panel (Completed)   Hepatic function panel (Completed)   Stress    Increased stress as outlined.  Discussed.  Does not feel needs any further intervention.  Follow.         Other Visit Diagnoses     Routine general medical examination at a health care facility    -  Primary   Cervical cancer screening       Relevant Orders   Cytology - PAP( [facility]) (Completed)        Einar Pheasant, MD

## 2021-03-23 NOTE — Assessment & Plan Note (Addendum)
Physical today 03/23/21.  PAP 03/23/21.  Mammogram 02/04/21 - Birads I.  Colonoscopy 07/2020.  Obtain results.

## 2021-03-24 LAB — CYTOLOGY - PAP
Comment: NEGATIVE
Diagnosis: NEGATIVE
High risk HPV: NEGATIVE

## 2021-03-28 ENCOUNTER — Encounter: Payer: Self-pay | Admitting: Internal Medicine

## 2021-03-28 NOTE — Assessment & Plan Note (Signed)
Increased stress as outlined.  Discussed.  Does not feel needs any further intervention.  Follow.  

## 2021-03-28 NOTE — Assessment & Plan Note (Signed)
Followed by hematology. Stable.  

## 2021-03-28 NOTE — Assessment & Plan Note (Signed)
Followed by hematology.  No further evaluation warranted.  See note.  

## 2021-03-28 NOTE — Assessment & Plan Note (Signed)
On protonix.  No upper symptoms reported.   

## 2021-03-28 NOTE — Assessment & Plan Note (Signed)
Followed by ENT.  Just evaluated.  Recommended continued monitoring - one year.   

## 2021-03-28 NOTE — Assessment & Plan Note (Signed)
Continue plendil and triam/hctz.  Blood pressure doing well.  Follow pressures.  Follow metabolic panel.  

## 2021-03-28 NOTE — Assessment & Plan Note (Signed)
On lipitor.  Low cholesterol diet and exercise.  Follow lipid panel and liver function tests.   

## 2021-03-28 NOTE — Assessment & Plan Note (Signed)
Followed by hematology 

## 2021-04-05 ENCOUNTER — Encounter: Payer: Self-pay | Admitting: Internal Medicine

## 2021-05-06 ENCOUNTER — Other Ambulatory Visit: Payer: Self-pay | Admitting: Internal Medicine

## 2021-07-27 ENCOUNTER — Other Ambulatory Visit: Payer: Self-pay

## 2021-07-27 ENCOUNTER — Ambulatory Visit: Payer: BC Managed Care – PPO | Admitting: Internal Medicine

## 2021-07-27 VITALS — BP 120/68 | HR 73 | Temp 97.6°F | Resp 16 | Ht 65.0 in | Wt 200.6 lb

## 2021-07-27 DIAGNOSIS — E7800 Pure hypercholesterolemia, unspecified: Secondary | ICD-10-CM

## 2021-07-27 DIAGNOSIS — I10 Essential (primary) hypertension: Secondary | ICD-10-CM

## 2021-07-27 DIAGNOSIS — Z23 Encounter for immunization: Secondary | ICD-10-CM | POA: Diagnosis not present

## 2021-07-27 DIAGNOSIS — F439 Reaction to severe stress, unspecified: Secondary | ICD-10-CM

## 2021-07-27 DIAGNOSIS — D472 Monoclonal gammopathy: Secondary | ICD-10-CM

## 2021-07-27 DIAGNOSIS — K219 Gastro-esophageal reflux disease without esophagitis: Secondary | ICD-10-CM

## 2021-07-27 NOTE — Progress Notes (Signed)
Patient ID: Kathleen Gould, female   DOB: [birth year], 55 y.o.   MRN: [id number]   Subjective:    Patient ID: Kathleen Gould, female    DOB: [birth year], 54 y.o.   MRN: [id number]  This visit occurred during the SARS-CoV-2 public health emergency.  Safety protocols were in place, including screening questions prior to the visit, additional usage of staff PPE, and extensive cleaning of exam room while observing appropriate contact time as indicated for disinfecting solutions.   Patient here for a scheduled follow up.   HPI Increased stress - work and family.  Mother has recently moved out.  Overall she feels she is handling things relatively well.  Does not feel needs any further intervention.  Tries to stay active.  No chest pain or sob reported.  No abdominal pain or bowel change reported.     Past Medical History:  Diagnosis Date   Arthritis    Endometriosis    Environmental allergies    GERD (gastroesophageal reflux disease)    Hypercholesterolemia    Hypertension    Uterine fibroid    Past Surgical History:  Procedure Laterality Date   ABDOMINAL HYSTERECTOMY     CESAREAN SECTION     x2   OOPHORECTOMY     S/P left secondary to cyst   THYROGLOSSAL DUCT CYST N/A 05/27/2015   Procedure: THYROGLOSSAL DUCT CYST EXCISION ;  Surgeon: Clyde Canterbury, MD;  Location: [facility] ORS;  Service: ENT;  Laterality: N/A;   Family History  Problem Relation Age of Onset   Heart attack Father 18   Heart disease Father        bypass surgery   Hypertension Father    Hypercholesterolemia Father    Kidney disease Father    Diabetes Mother    Diabetes Other        aunt   Breast cancer Neg Hx    Social History   Socioeconomic History   Marital status: Married    Spouse name: Not on file   Number of children: 2   Years of education: Not on file   Highest education level: Not on file  Occupational History   Not on file  Tobacco Use   Smoking status: Never   Smokeless tobacco: Never   Vaping Use   Vaping Use: Never used  Substance and Sexual Activity   Alcohol use: No    Alcohol/week: 0.0 standard drinks   Drug use: No   Sexual activity: Not Currently  Other Topics Concern   Not on file  Social History Narrative   Widowed    Social Determinants of Health   Financial Resource Strain: Not on file  Food Insecurity: Not on file  Transportation Needs: Not on file  Physical Activity: Not on file  Stress: Not on file  Social Connections: Not on file     Review of Systems  Constitutional:  Negative for appetite change and unexpected weight change.  HENT:  Negative for congestion and sinus pressure.   Respiratory:  Negative for cough, chest tightness and shortness of breath.   Cardiovascular:  Negative for chest pain, palpitations and leg swelling.  Gastrointestinal:  Negative for abdominal pain, diarrhea, nausea and vomiting.  Genitourinary:  Negative for difficulty urinating and dysuria.  Musculoskeletal:  Negative for joint swelling and myalgias.  Skin:  Negative for color change and rash.  Neurological:  Negative for dizziness, light-headedness and headaches.  Psychiatric/Behavioral:  Negative for agitation and dysphoric mood.  Objective:     BP 120/68   Pulse 73   Temp 97.6 F (36.4 C)   Resp 16   Ht 5\' 5"  (1.651 m)   Wt 200 lb 9.6 oz (91 kg)   LMP 11/06/2000   SpO2 98%   BMI 33.38 kg/m  Wt Readings from Last 3 Encounters:  07/27/21 200 lb 9.6 oz (91 kg)  03/23/21 219 lb 9.6 oz (99.6 kg)  01/07/21 227 lb 1.2 oz (103 kg)    Physical Exam Vitals reviewed.  Constitutional:      General: She is not in acute distress.    Appearance: Normal appearance.  HENT:     Head: Normocephalic and atraumatic.     Right Ear: External ear normal.     Left Ear: External ear normal.  Eyes:     General: No scleral icterus.       Right eye: No discharge.        Left eye: No discharge.     Conjunctiva/sclera: Conjunctivae normal.  Neck:      Thyroid: No thyromegaly.  Cardiovascular:     Rate and Rhythm: Normal rate and regular rhythm.  Pulmonary:     Effort: No respiratory distress.     Breath sounds: Normal breath sounds. No wheezing.  Abdominal:     General: Bowel sounds are normal.     Palpations: Abdomen is soft.     Tenderness: There is no abdominal tenderness.  Musculoskeletal:        General: No swelling or tenderness.     Cervical back: Neck supple. No tenderness.  Lymphadenopathy:     Cervical: No cervical adenopathy.  Skin:    Findings: No erythema or rash.  Neurological:     Mental Status: She is alert.  Psychiatric:        Mood and Affect: Mood normal.        Behavior: Behavior normal.     Outpatient Encounter Medications as of 07/27/2021  Medication Sig   atorvastatin (LIPITOR) 20 MG tablet TAKE 1 TABLET BY MOUTH EVERY DAY   felodipine (PLENDIL) 5 MG 24 hr tablet TAKE 1 TABLET BY MOUTH EVERY DAY   fluticasone (FLONASE) 50 MCG/ACT nasal spray Place 2 sprays into both nostrils daily.   pantoprazole (PROTONIX) 40 MG tablet TAKE 1 TABLET BY MOUTH EVERY DAY   triamterene-hydrochlorothiazide (MAXZIDE-25) 37.5-25 MG tablet TAKE 0.5 TABLETS BY MOUTH DAILY.   No facility-administered encounter medications on file as of 07/27/2021.     Lab Results  Component Value Date   WBC 5.7 01/07/2021   HGB 12.3 01/07/2021   HCT 38.5 01/07/2021   PLT 442 (H) 01/07/2021   GLUCOSE 84 03/23/2021   CHOL 210 (H) 03/23/2021   TRIG 70.0 03/23/2021   HDL 68.20 03/23/2021   LDLDIRECT 164.5 07/24/2013   LDLCALC 128 (H) 03/23/2021   ALT 12 03/23/2021   AST 14 03/23/2021   NA 139 03/23/2021   K 3.8 03/23/2021   CL 101 03/23/2021   CREATININE 0.73 03/23/2021   BUN 8 03/23/2021   CO2 30 03/23/2021   TSH 1.97 12/15/2020   INR 1.0 04/21/2014       Assessment & Plan:   Problem List Items Addressed This Visit     Esophageal reflux    On protonix.  No upper symptoms reported.        Essential hypertension, benign     Continue plendil and triam/hctz.  Blood pressure doing well.  Follow pressures.  Follow metabolic panel.  MGUS (monoclonal gammopathy of unknown significance)    Followed by hematology.  Stable.        Pure hypercholesterolemia - Primary    On lipitor.  Low cholesterol diet and exercise.  Follow lipid panel and liver function tests.        Relevant Orders   Basic metabolic panel   Lipid panel   Hepatic function panel   Stress    Increased stress as outlined.  Discussed.  Does not feel needs any further intervention.  Follow.        Other Visit Diagnoses     Need for immunization against influenza       Relevant Orders   Flu Vaccine QUAD 90mo+IM (Fluarix, Fluzone & Alfiuria Quad PF) (Completed)        Einar Pheasant, MD

## 2021-08-01 ENCOUNTER — Encounter: Payer: Self-pay | Admitting: Internal Medicine

## 2021-08-01 NOTE — Assessment & Plan Note (Signed)
On protonix.  No upper symptoms reported.   

## 2021-08-01 NOTE — Assessment & Plan Note (Signed)
Increased stress as outlined.  Discussed.  Does not feel needs any further intervention.  Follow.  

## 2021-08-01 NOTE — Assessment & Plan Note (Signed)
Continue plendil and triam/hctz.  Blood pressure doing well.  Follow pressures.  Follow metabolic panel.  

## 2021-08-01 NOTE — Assessment & Plan Note (Signed)
Followed by hematology. Stable.  

## 2021-08-01 NOTE — Assessment & Plan Note (Signed)
On lipitor.  Low cholesterol diet and exercise.  Follow lipid panel and liver function tests.   

## 2021-08-27 ENCOUNTER — Other Ambulatory Visit (INDEPENDENT_AMBULATORY_CARE_PROVIDER_SITE_OTHER): Payer: BC Managed Care – PPO

## 2021-08-27 ENCOUNTER — Other Ambulatory Visit: Payer: Self-pay

## 2021-08-27 DIAGNOSIS — E7800 Pure hypercholesterolemia, unspecified: Secondary | ICD-10-CM | POA: Diagnosis not present

## 2021-08-27 LAB — BASIC METABOLIC PANEL
BUN: 8 mg/dL (ref 6–23)
CO2: 30 mEq/L (ref 19–32)
Calcium: 10.1 mg/dL (ref 8.4–10.5)
Chloride: 103 mEq/L (ref 96–112)
Creatinine, Ser: 0.69 mg/dL (ref 0.40–1.20)
GFR: 96.44 mL/min (ref >60.00)
Glucose, Bld: 86 mg/dL (ref 70–99)
Potassium: 4.1 mEq/L (ref 3.5–5.1)
Sodium: 140 mEq/L (ref 135–145)

## 2021-08-27 LAB — HEPATIC FUNCTION PANEL
ALT: 11 U/L (ref 0–35)
AST: 12 U/L (ref 0–37)
Albumin: 4.3 g/dL (ref 3.5–5.2)
Alkaline Phosphatase: 59 U/L (ref 39–117)
Bilirubin, Direct: 0.1 mg/dL (ref 0.0–0.3)
Total Bilirubin: 0.8 mg/dL (ref 0.2–1.2)
Total Protein: 7.5 g/dL (ref 6.0–8.3)

## 2021-08-27 LAB — LIPID PANEL
Cholesterol: 229 mg/dL — ABNORMAL HIGH (ref 0–200)
HDL: 73.7 mg/dL (ref >39.00)
LDL Cholesterol: 141 mg/dL — ABNORMAL HIGH (ref 0–99)
NonHDL: 155.64
Total CHOL/HDL Ratio: 3
Triglycerides: 72 mg/dL (ref 0.0–149.0)
VLDL: 14.4 mg/dL (ref 0.0–40.0)

## 2021-09-21 ENCOUNTER — Encounter: Payer: Self-pay | Admitting: Family Medicine

## 2021-09-21 ENCOUNTER — Ambulatory Visit (INDEPENDENT_AMBULATORY_CARE_PROVIDER_SITE_OTHER): Payer: BC Managed Care – PPO | Admitting: Family Medicine

## 2021-09-21 ENCOUNTER — Other Ambulatory Visit: Payer: Self-pay

## 2021-09-21 DIAGNOSIS — U071 COVID-19: Secondary | ICD-10-CM | POA: Diagnosis not present

## 2021-09-21 MED ORDER — NIRMATRELVIR/RITONAVIR (PAXLOVID)TABLET: 3.0000 | ORAL_TABLET | Freq: Two times a day (BID) | ORAL | 0 refills | Status: AC

## 2021-09-21 NOTE — Patient Instructions (Addendum)
---------------------------------------------------------------------------------------------------------------------------    WORK SLIP:  Patient Kathleen Gould,  17-Sep-1965, was seen for a medical visit today, 09/21/21 . Please excuse from work for a COVID/flu like illness. If Covid19 testing is positive advise 10 days minimum from the onset of symptoms (09/19/21) PLUS 1 day of no fever and improved symptoms. Will defer to employer for a sooner return to work if patient has 2 negative covid tests 48 hours apart and is feeling better, or if symptoms have resolved, it is greater than 5 days since the positive test and the patient can wear a high-quality, tight fitting mask such as N95 or KN95 at all times for an additional 5 days. Would also suggest COVID19 antigen testing is negative prior to early return from a Covid illness.  Sincerely: E-signature: Dr. Colin Benton, DO [facility] Ph: [phone]   ------------------------------------------------------------------------------------------------------------------------------   HOME CARE TIPS:    -I sent the medication(s) we discussed to your pharmacy: Meds ordered this encounter  Medications   nirmatrelvir/ritonavir EUA (PAXLOVID) 20 x 150 MG & 10 x 100MG TABS    Sig: Take 3 tablets by mouth 2 (two) times daily for 5 days. (Take nirmatrelvir 150 mg two tablets twice daily for 5 days and ritonavir 100 mg one tablet twice daily for 5 days) Patient GFR is > 60    Dispense:  30 tablet    Refill:  0     -I sent in the [place] treatment or referral you requested per our discussion. Please see the information provided below and discuss further with the pharmacist/treatment team.  -If taking Paxlovid, please review all medications, supplement and over the counter drugs with your pharmacist and ask them to check for any interactions. Please make the following changes to your regular medications while taking  Paxlovid: *Please STOP your statin (Lipitor/atorvastatin) for 8 days and restart 3 days after finishing the Paxlovid. *Please take your felodipine every other day while taking the Paxlovid.   -there is a chance of rebound illness after finishing your treatment. If you become sick again please isolate for an additional 5 days, plus 5 more days of masking.   -can use tylenol if needed for fevers, aches and pains per instructions  -can use nasal saline a few times per day if you have nasal congestion  -stay hydrated, drink plenty of fluids and eat small healthy meals - avoid dairy   -stay home while sick, except to seek medical care. If you have COVID19, you will likely be contagious for 7-10 days. Flu or Influenza is likely contagious for about 7 days. Other respiratory viral infections remain contagious for 5-10+ days depending on the virus and many other factors. Wear a good mask that fits snugly (such as N95 or KN95) if around others to reduce the risk of transmission.  It was nice to meet you today, and I really hope you are feeling better soon. I help [facility] out with telemedicine visits on Tuesdays and Thursdays and am happy to help if you need a follow up virtual visit on those days. Otherwise, if you have any concerns or questions following this visit please schedule a follow up visit with your Primary Care doctor or seek care at a local urgent care clinic to avoid delays in care.    Seek in person care or schedule a follow up video visit promptly if your symptoms worsen, new concerns arise or you are not improving with treatment. Call 911 and/or  seek emergency care if your symptoms are severe or life threatening.  FACT SHEET FOR PATIENTS, PARENTS, AND CAREGIVERS EMERGENCY USE AUTHORIZATION (EUA) OF PAXLOVID FOR CORONAVIRUS DISEASE 2019 (COVID-19) You are being given this Fact Sheet because your healthcare provider believes it is necessary to provide you with PAXLOVID for the  treatment of mild-to-moderate coronavirus disease (COVID-19) caused by the SARS-CoV-2 virus. This Fact Sheet contains information to help you understand the risks and benefits of taking the PAXLOVID you have received or may receive. The U.S. Food and Drug Administration (FDA) has issued an Emergency Use Authorization (EUA) to make PAXLOVID available during the COVID-19 pandemic (for more details about an EUA please see What is an Emergency Use Authorization? at the end of this document). PAXLOVID is not an FDA-approved medicine in the [place]. Read this Fact Sheet for information about PAXLOVID. Talk to your healthcare provider about your options or if you have any questions. It is your choice to take PAXLOVID.  What is COVID-19? COVID-19 is caused by a virus called a coronavirus. You can get COVID-19 through close contact with another person who has the virus. COVID-19 illnesses have ranged from very mild-to-severe, including illness resulting in death. While information so far suggests that most COVID-19 illness is mild, serious illness can happen and may cause some of your other medical conditions to become worse. Older people and people of all ages with severe, long lasting (chronic) medical conditions like heart disease, lung disease, and diabetes, for example seem to be at higher risk of being hospitalized for COVID-19.  What is PAXLOVID? PAXLOVID is an investigational medicine used to treat mild-to-moderate COVID-19 in adults and children [42 years of age and older weighing at least 22 pounds (41 kg)] with positive results of direct SARS-CoV-2 viral testing, and who are at high risk for progression to severe COVID-19, including hospitalization or death. PAXLOVID is investigational because it is still being studied. There is limited information about the safety and effectiveness of using PAXLOVID to treat people with mild-to-moderate COVID-19.  The FDA has authorized the  emergency use of PAXLOVID for the treatment of mild-tomoderate COVID-19 in adults and children [72 years of age and older weighing at least 70 pounds (59 kg)] with a positive test for the virus that causes COVID-19, and who are at high risk for progression to severe COVID-19, including hospitalization or death, under an EUA. 1 Revised: 13 November 2020   What should I tell my healthcare provider before I take PAXLOVID? Tell your healthcare provider if you: ? Have any allergies ? Have liver or kidney disease ? Are pregnant or plan to become pregnant ? Are breastfeeding a child ? Have any serious illnesses  Tell your healthcare provider about all the medicines you take, including prescription and over-the-counter medicines, vitamins, and herbal supplements. Some medicines may interact with PAXLOVID and may cause serious side effects. Keep a list of your medicines to show your healthcare provider and pharmacist when you get a new medicine.  You can ask your healthcare provider or pharmacist for a list of medicines that interact with PAXLOVID. Do not start taking a new medicine without telling your healthcare provider. Your healthcare provider can tell you if it is safe to take PAXLOVID with other medicines.  Tell your healthcare provider if you are taking combined hormonal contraceptive. PAXLOVID may affect how your birth control pills work. Females who are able to become pregnant should use another effective alternative form of contraception or an additional barrier  method of contraception. Talk to your healthcare provider if you have any questions about contraceptive methods that might be right for you.  How do I take PAXLOVID? ? PAXLOVID consists of 2 medicines: nirmatrelvir and ritonavir. o Take 2 pink tablets of nirmatrelvir with 1 white tablet of ritonavir by mouth 2 times each day (in the morning and in the evening) for 5 days. For each dose, take all 3 tablets at the same  time. o If you have kidney disease, talk to your healthcare provider. You may need a different dose. ? Swallow the tablets whole. Do not chew, break, or crush the tablets. ? Take PAXLOVID with or without food. ? Do not stop taking PAXLOVID without talking to your healthcare provider, even if you feel better. ? If you miss a dose of PAXLOVID within 8 hours of the time it is usually taken, take it as soon as you remember. If you miss a dose by more than 8 hours, skip the missed dose and take the next dose at your regular time. Do not take 2 doses of PAXLOVID at the same time. ? If you take too much PAXLOVID, call your healthcare provider or go to the nearest hospital emergency room right away. ? If you are taking a ritonavir- or cobicistat-containing medicine to treat hepatitis C or Human Immunodeficiency Virus (HIV), you should continue to take your medicine as prescribed by your healthcare provider. 2 Revised: 13 November 2020    Talk to your healthcare provider if you do not feel better or if you feel worse after 5 days.  Who should generally not take PAXLOVID? Do not take PAXLOVID if: ? You are allergic to nirmatrelvir, ritonavir, or any of the ingredients in PAXLOVID. ? You are taking any of the following medicines: o Alfuzosin o Pethidine, propoxyphene o Ranolazine o Amiodarone, dronedarone, flecainide, propafenone, quinidine o Colchicine o Lurasidone, pimozide, clozapine o Dihydroergotamine, ergotamine, methylergonovine o Lovastatin, simvastatin o Sildenafil (Revatio) for pulmonary arterial hypertension (PAH) o Triazolam, oral midazolam o Apalutamide o Carbamazepine, phenobarbital, phenytoin o Rifampin o St. Johns Wort (hypericum perforatum) Taking PAXLOVID with these medicines may cause serious or life-threatening side effects or affect how PAXLOVID works.  These are not the only medicines that may cause serious side effects if taken with PAXLOVID. PAXLOVID may  increase or decrease the levels of multiple other medicines. It is very important to tell your healthcare provider about all of the medicines you are taking because additional laboratory tests or changes in the dose of your other medicines may be necessary while you are taking PAXLOVID. Your healthcare provider may also tell you about specific symptoms to watch out for that may indicate that you need to stop or decrease the dose of some of your other medicines.  What are the important possible side effects of PAXLOVID? Possible side effects of PAXLOVID are: ? Allergic Reactions. Allergic reactions can happen in people taking PAXLOVID, even after only 1 dose. Stop taking PAXLOVID and call your healthcare provider right away if you get any of the following symptoms of an allergic reaction: o hives o trouble swallowing or breathing o swelling of the mouth, lips, or face o throat tightness o hoarseness 3 Revised: 13 November 2020  o skin rash ? Liver Problems. Tell your healthcare provider right away if you have any of these signs and symptoms of liver problems: loss of appetite, yellowing of your skin and the whites of eyes (jaundice), dark-colored urine, pale colored stools and itchy skin, stomach  area (abdominal) pain. ? Resistance to HIV Medicines. If you have untreated HIV infection, PAXLOVID may lead to some HIV medicines not working as well in the future. ? Other possible side effects include: o altered sense of taste o diarrhea o high blood pressure o muscle aches These are not all the possible side effects of PAXLOVID. Not many people have taken PAXLOVID. Serious and unexpected side effects may happen. PAXLOVID is still being studied, so it is possible that all of the risks are not known at this time.  What other treatment choices are there? Veklury (remdesivir) is FDA-approved for the treatment of mild-to-moderate NFAOZ-30 in certain adults and children. Talk with your doctor  to see if Marijean Heath is appropriate for you. Like PAXLOVID, FDA may also allow for the emergency use of other medicines to treat people with COVID-19. Go to https://price.info/ for information on the emergency use of other medicines that are authorized by FDA to treat people with COVID-19. Your healthcare provider may talk with you about clinical trials for which you may be eligible. It is your choice to be treated or not to be treated with PAXLOVID. Should you decide not to receive it or for your child not to receive it, it will not change your standard medical care.  What if I am pregnant or breastfeeding? There is no experience treating pregnant women or breastfeeding mothers with PAXLOVID. For a mother and unborn baby, the benefit of taking PAXLOVID may be greater than the risk from the treatment. If you are pregnant, discuss your options and specific situation with your healthcare provider. It is recommended that you use effective barrier contraception or do not have sexual activity while taking PAXLOVID. If you are breastfeeding, discuss your options and specific situation with your healthcare provider. 4 Revised: 13 November 2020   How do I report side effects with PAXLOVID? Contact your healthcare provider if you have any side effects that bother you or do not go away. Report side effects to FDA MedWatch at SmoothHits.hu or call 1-800-FDA1088 or you can report side effects to [employer]. at the contact information provided below. Website Fax number Telephone number www.pfizersafetyreporting.com [phone] How should I store [place]? Store PAXLOVID tablets at room temperature, between 68?F to 77?F (20?C to 25?C). How can I learn more about COVID-19? ? Ask your healthcare provider. ? Visit https://jacobson-johnson.com/. ? Contact your local or state  public health department. What is an Emergency Use Authorization (EUA)? The [place] FDA has made PAXLOVID available under an emergency access mechanism called an Emergency Use Authorization (EUA). The EUA is supported by a Education officer, museum and Human Service (HHS) declaration that circumstances exist to justify the emergency use of drugs and biological products during the COVID-19 pandemic. PAXLOVID for the treatment of mild-to-moderate COVID-19 in adults and children [11 years of age and older weighing at least 43 pounds (28 kg)] with positive results of direct SARS-CoV-2 viral testing, and who are at high risk for progression to severe COVID-19, including hospitalization or death, has not undergone the same type of review as an FDA-approved product. In issuing an EUA under the UUVOZ-36 public health emergency, the FDA has determined, among other things, that based on the total amount of scientific evidence available including data from adequate and well-controlled clinical trials, if available, it is reasonable to believe that the product may be effective for diagnosing, treating, or preventing COVID-19, or a serious or life-threatening disease or condition caused by COVID-19; that the known and  potential benefits of the product, when used to diagnose, treat, or prevent such disease or condition, outweigh the known and potential risks of such product; and that there are no adequate, approved, and available alternatives. All of these criteria must be met to allow for the product to be used in the treatment of patients during the COVID-19 pandemic. The EUA for PAXLOVID is in effect for the duration of the COVID-19 declaration justifying emergency use of this product, unless terminated or revoked (after which the products may no longer be used under the EUA). 5 Revised: 13 November 2020     Additional Information For general questions, visit the website or call the telephone number  provided below. Website Telephone number www.COVID19oralRx.com [phone] (1-877-C19-PACK) You can also go to www.pfizermedinfo.com or call [phone] for more information. GBE-0100-7.1 Revised: 13 November 2020

## 2021-09-21 NOTE — Progress Notes (Signed)
Virtual Visit via Telephone Note  I connected with Kathleen Gould on 09/21/21 at  4:20 PM EST by telephone and verified that I am speaking with the correct person using two identifiers.   I discussed the limitations of performing an evaluation and management service by telephone and requested permission for a phone visit. The patient expressed understanding and agreed to proceed.  Location patient:  [place] Location provider: work or home office Participants present for the call: patient, provider Patient did not have a visit with me in the prior 7 days to address this/these issue(s).   History of Present Illness:  Acute telemedicine visit for Covid19: -Onset: 2-3 days ago; positive covid test today -Symptoms include: nasal congestion, mild headache, loss of smell -Denies:fever, CP, SOB, NVD -Has tried: robitussin, tylenol -Pertinent past medical history: see below -GFR was 96 12/22 -Pertinent medication allergies:  Allergies  Allergen Reactions   Cefdinir Itching   Levofloxacin Other (See Comments)    Joint pain   Simvastatin Other (See Comments)    "muscle aches"  -COVID-19 vaccine status: 2 doses and 2 booster Immunization History  Administered Date(s) Administered   Influenza Inj Mdck Quad Pf 06/02/2019   Influenza,inj,Quad PF,6+ Mos 06/02/2019, 07/27/2021   Moderna Sars-Covid-2 Vaccination 10/24/2019, 11/21/2019, 02/15/2021   PFIZER(Purple Top)SARS-COV-2 Vaccination 06/25/2020      Past Medical History:  Diagnosis Date   Arthritis    Endometriosis    Environmental allergies    GERD (gastroesophageal reflux disease)    Hypercholesterolemia    Hypertension    Uterine fibroid     Current Outpatient Medications on File Prior to Visit  Medication Sig Dispense Refill   atorvastatin (LIPITOR) 20 MG tablet TAKE 1 TABLET BY MOUTH EVERY DAY 90 tablet 3   felodipine (PLENDIL) 5 MG 24 hr tablet TAKE 1 TABLET BY MOUTH EVERY DAY 90 tablet 1   pantoprazole (PROTONIX) 40 MG  tablet TAKE 1 TABLET BY MOUTH EVERY DAY 90 tablet 1   triamterene-hydrochlorothiazide (MAXZIDE-25) 37.5-25 MG tablet TAKE 0.5 TABLETS BY MOUTH DAILY. 30 tablet 5   fluticasone (FLONASE) 50 MCG/ACT nasal spray Place 2 sprays into both nostrils daily. 16 g 6   No current facility-administered medications on file prior to visit.    Observations/Objective: Patient sounds cheerful and well on the phone. I do not appreciate any SOB. Speech and thought processing are grossly intact. Patient reported vitals:  Assessment and Plan:  COVID-19   Discussed treatment options and risk of drug interactions, ideal treatment window, potential complications, isolation and precautions for COVID-19.  Discussed possibility of rebound with antivirals and the need to reisolate if it should occur for 5 days. Checked for/reviewed any labs done in the last 90 days with GFR listed in HPI if available. After lengthy discussion, the patient opted for treatment with Paxlovid due to being higher risk for complications of covid or severe disease and other factors. Discussed EUA status of this drug and the fact that there is preliminary limited knowledge of risks/interactions/side effects per EUA document vs possible benefits and precautions. This information was shared with patient during the visit and also was provided in patient instructions. Also, advised that patient discuss risks/interactions and use with pharmacist/treatment team as well.  Other symptomatic care measures summarized in patient instructions. Work/School slipped offered: provided in patient instructions  Advised to seek prompt virtual visit or in person care if worsening, new symptoms arise, or if is not improving with treatment as expected per our conversation of expected course.  Discussed options for follow up care. Did let this patient know that I do telemedicine on Tuesdays and Thursdays for [facility] and those are the days I am logged into the system.  Advised to schedule follow up visit with PCP, [place] virtual visits or [facility] if any further questions or concerns to avoid delays in care.   I discussed the assessment and treatment plan with the patient. The patient was provided an opportunity to ask questions and all were answered. The patient agreed with the plan and demonstrated an understanding of the instructions.    Follow Up Instructions:  I did not refer this patient for an OV with me in the next 24 hours for this/these issue(s).  I discussed the assessment and treatment plan with the patient. The patient was provided an opportunity to ask questions and all were answered. The patient agreed with the plan and demonstrated an understanding of the instructions.   I spent 15 minutes on the date of this visit in the care of this patient. See summary of tasks completed to properly care for this patient in the detailed notes above which also included counseling of above, review of PMH, medications, allergies, evaluation of the patient and ordering and/or  instructing patient on testing and care options.     Lucretia Kern, DO

## 2021-10-04 ENCOUNTER — Other Ambulatory Visit: Payer: Self-pay | Admitting: Internal Medicine

## 2021-10-04 DIAGNOSIS — K219 Gastro-esophageal reflux disease without esophagitis: Secondary | ICD-10-CM

## 2021-10-06 ENCOUNTER — Other Ambulatory Visit: Payer: Self-pay | Admitting: Internal Medicine

## 2021-11-10 ENCOUNTER — Telehealth: Payer: Self-pay | Admitting: Internal Medicine

## 2021-11-10 ENCOUNTER — Other Ambulatory Visit: Payer: Self-pay

## 2021-11-10 MED ORDER — TRIAMTERENE-HCTZ 37.5-25 MG PO TABS: 0.5000 | ORAL_TABLET | Freq: Every day | ORAL | 5 refills | Status: DC

## 2021-11-10 NOTE — Telephone Encounter (Signed)
Pt called in requesting for refill on medication (triamterene-hydrochlorothiazide (MAXZIDE-25) 37.5-25 MG tablet). Pt stated that the pharmacy reached out to the office and no one returned call/fax. Pt stated she request for her medication to be sent to [facility] in [place]. Pt requesting callback  ?

## 2021-11-10 NOTE — Telephone Encounter (Signed)
Medication sent in. LM letting pt know. ?

## 2022-01-01 ENCOUNTER — Other Ambulatory Visit: Payer: Self-pay | Admitting: Family

## 2022-01-01 DIAGNOSIS — R059 Cough, unspecified: Secondary | ICD-10-CM

## 2022-01-06 ENCOUNTER — Other Ambulatory Visit: Payer: Self-pay

## 2022-01-06 DIAGNOSIS — D472 Monoclonal gammopathy: Secondary | ICD-10-CM

## 2022-01-07 ENCOUNTER — Inpatient Hospital Stay: Payer: BC Managed Care – PPO | Admitting: Internal Medicine

## 2022-01-07 ENCOUNTER — Encounter: Payer: Self-pay | Admitting: Internal Medicine

## 2022-01-07 ENCOUNTER — Inpatient Hospital Stay: Payer: BC Managed Care – PPO | Attending: Internal Medicine

## 2022-01-07 VITALS — BP 127/78 | HR 85 | Temp 98.0°F | Ht 65.0 in | Wt 202.0 lb

## 2022-01-07 DIAGNOSIS — D472 Monoclonal gammopathy: Secondary | ICD-10-CM

## 2022-01-07 DIAGNOSIS — E876 Hypokalemia: Secondary | ICD-10-CM | POA: Diagnosis not present

## 2022-01-07 DIAGNOSIS — Z79899 Other long term (current) drug therapy: Secondary | ICD-10-CM | POA: Insufficient documentation

## 2022-01-07 LAB — CBC WITH DIFFERENTIAL/PLATELET
Abs Immature Granulocytes: 0.02 10*3/uL (ref 0.00–0.07)
Basophils Absolute: 0 10*3/uL (ref 0.0–0.1)
Basophils Relative: 0 %
Eosinophils Absolute: 0.4 10*3/uL (ref 0.0–0.5)
Eosinophils Relative: 6 %
HCT: 39.7 % (ref 36.0–46.0)
Hemoglobin: 13 g/dL (ref 12.0–15.0)
Immature Granulocytes: 0 %
Lymphocytes Relative: 41 %
Lymphs Abs: 2.8 10*3/uL (ref 0.7–4.0)
MCH: 28.6 pg (ref 26.0–34.0)
MCHC: 32.7 g/dL (ref 30.0–36.0)
MCV: 87.4 fL (ref 80.0–100.0)
Monocytes Absolute: 0.4 10*3/uL (ref 0.1–1.0)
Monocytes Relative: 6 %
Neutro Abs: 3.2 10*3/uL (ref 1.7–7.7)
Neutrophils Relative %: 47 %
Platelets: 453 10*3/uL — ABNORMAL HIGH (ref 150–400)
RBC: 4.54 MIL/uL (ref 3.87–5.11)
RDW: 13.8 % (ref 11.5–15.5)
WBC: 6.9 10*3/uL (ref 4.0–10.5)
nRBC: 0 % (ref 0.0–0.2)

## 2022-01-07 LAB — COMPREHENSIVE METABOLIC PANEL
ALT: 14 U/L (ref 0–44)
AST: 15 U/L (ref 15–41)
Albumin: 4 g/dL (ref 3.5–5.0)
Alkaline Phosphatase: 68 U/L (ref 38–126)
Anion gap: 8 (ref 5–15)
BUN: 12 mg/dL (ref 6–20)
CO2: 28 mmol/L (ref 22–32)
Calcium: 9 mg/dL (ref 8.9–10.3)
Chloride: 102 mmol/L (ref 98–111)
Creatinine, Ser: 0.71 mg/dL (ref 0.44–1.00)
GFR, Estimated: >60 mL/min (ref >60)
Glucose, Bld: 97 mg/dL (ref 70–99)
Potassium: 3.4 mmol/L — ABNORMAL LOW (ref 3.5–5.1)
Sodium: 138 mmol/L (ref 135–145)
Total Bilirubin: 0.7 mg/dL (ref 0.3–1.2)
Total Protein: 7.6 g/dL (ref 6.5–8.1)

## 2022-01-07 NOTE — Progress Notes (Signed)
C/o with allergic reaction she had to something last week. ?

## 2022-01-07 NOTE — Assessment & Plan Note (Signed)
Asymptomatic. Patient's M protein in March 2022 0.4g/dL normal kappa Lambda light chain ratio. CBC CMP today are normal. Myeloma panel/ lambda light chain monoclonal protein pending. ? ?#I again had a long conversation with the patient regarding the pathophysiology of MGUS/small risk of turning into multiple myeloma about 1 %/year.  ? ?#Mild hypokalemia potassium 3.4-likely secondary to hydrochlorothiazide.  Dietary intervention discussed. ? ?# Thrombocytosis: 450-460- low risk of MPN/ ?  Reactive.  Check MPN panel.  Also check iron studies. ? ?# DISPOSITION: ?# follow up in 6 months- MD; 1 week prior- labs- cbc/cmp;MM panel; k/l light chains; iron studies/ferritin-- Dr.B ?

## 2022-01-07 NOTE — Progress Notes (Signed)
[place] ?OFFICE PROGRESS NOTE ? ?Patient Care Team: ?Einar Pheasant, MD as PCP - General (Internal Medicine) ?Lequita Asal, MD (Inactive) as Referring Physician (Hematology and Oncology) ?Alice Reichert, Benay Pike, MD as Consulting Physician (Gastroenterology) ?Rodney Booze as Physician Assistant ? ? Cancer Staging  ?No matching staging information was found for the patient. ? 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 Serum LDH, Cr, CBC, Flow Cytometry, Skeletal survey all unremarkable. ?  ?# RML lung nodule 46m [June 2018; incidental] ? ?Oncology History  ? No history exists.  ?  ? ?INTERVAL HISTORY: ? ?Kathleen Gould 56y.o.  female pleasant patient above history of MGUS is here for follow-up.  ? ?In the interim patient had episode of allergic reaction-skin rash to unclear etiology.  Noted to have lip swelling with Benadryl.  Patient received steroid injection symptoms resolved. ? ? ?Patient denies any tingling and numbness. Denies any weight loss or unusual bone pain. No nausea no vomiting no weight loss. ? ?Patient denies any unusual shortness of breath or cough. ? ? ?Review of Systems  ?Constitutional:  Negative for chills, diaphoresis, fever, malaise/fatigue and weight loss.  ?HENT:  Negative for nosebleeds and sore throat.   ?Eyes:  Negative for double vision.  ?Respiratory:  Negative for cough, hemoptysis, sputum production, shortness of breath and wheezing.   ?Cardiovascular:  Negative for chest pain, palpitations, orthopnea and leg swelling.  ?Gastrointestinal:  Negative for abdominal pain, blood in stool, constipation, diarrhea, heartburn, melena, nausea and vomiting.  ?Genitourinary:  Negative for dysuria, frequency and urgency.  ?Musculoskeletal:  Positive for joint pain. Negative for back pain.  ?Skin: Negative.  Negative for itching and rash.   ?Neurological:  Negative for dizziness, tingling, focal weakness, weakness and headaches.  ?Endo/Heme/Allergies:  Does not bruise/bleed easily.  ?Psychiatric/Behavioral:  Negative for depression. The patient is not nervous/anxious and does not have insomnia.   ? ? ? ?PAST MEDICAL HISTORY :  ?Past Medical History:  ?Diagnosis Date  ? Arthritis   ? Endometriosis   ? Environmental allergies   ? GERD (gastroesophageal reflux disease)   ? Hypercholesterolemia   ? Hypertension   ? Uterine fibroid   ? ? ?PAST SURGICAL HISTORY :   ?Past Surgical History:  ?Procedure Laterality Date  ? ABDOMINAL HYSTERECTOMY    ? CESAREAN SECTION    ? x2  ? OOPHORECTOMY    ? S/P left secondary to cyst  ? THYROGLOSSAL DUCT CYST N/A 05/27/2015  ? Procedure: THYROGLOSSAL DUCT CYST EXCISION ;  Surgeon: PClyde Canterbury MD;  Location: [facility] ORS;  Service: ENT;  Laterality: N/A;  ? ? ?FAMILY HISTORY :   ?Family History  ?Problem Relation Age of Onset  ? Heart attack Father 527 ? Heart disease Father   ?     bypass surgery  ? Hypertension Father   ? Hypercholesterolemia Father   ? Kidney disease Father   ? Diabetes Mother   ? Diabetes Other   ?     aunt  ? Breast cancer Neg Hx   ? ? ?SOCIAL HISTORY:   ?Social History  ? ?Tobacco Use  ? Smoking status: Never  ? Smokeless tobacco: Never  ?Vaping Use  ? Vaping Use: Never used  ?Substance Use Topics  ? Alcohol use: No  ?  Alcohol/week: 0.0 standard drinks  ? Drug use: No  ? ? ?ALLERGIES:  is allergic  to cefdinir, levofloxacin, and simvastatin. ? ?MEDICATIONS:  ?Current Outpatient Medications  ?Medication Sig Dispense Refill  ? atorvastatin (LIPITOR) 20 MG tablet TAKE 1 TABLET BY MOUTH EVERY DAY 90 tablet 3  ? felodipine (PLENDIL) 5 MG 24 hr tablet TAKE 1 TABLET BY MOUTH EVERY DAY 90 tablet 1  ? fluticasone (FLONASE) 50 MCG/ACT nasal spray SPRAY 2 SPRAYS INTO EACH NOSTRIL EVERY DAY 48 mL 0  ? pantoprazole (PROTONIX) 40 MG tablet TAKE 1 TABLET BY MOUTH EVERY DAY 90 tablet 1  ? triamterene-hydrochlorothiazide  (MAXZIDE-25) 37.5-25 MG tablet Take 0.5 tablets by mouth daily. 30 tablet 5  ? ?No current facility-administered medications for this visit.  ? ? ?PHYSICAL EXAMINATION: ?ECOG PERFORMANCE STATUS: 0 - Asymptomatic ? ?BP 127/78 (BP Location: Left Arm, Patient Position: Sitting, Cuff Size: Normal)   Pulse 85   Temp 98 ?F (36.7 ?C) (Tympanic)   Ht _0  (1.651 m)   Wt 202 lb (91.6 kg)   LMP 11/06/2000   SpO2 100%   BMI 33.61 kg/m?  ? Danley Danker Weights  ? 01/07/22 1511  ?Weight: 202 lb (91.6 kg)  ? ? ?Physical Exam ?Vitals and nursing note reviewed.  ?HENT:  ?   Head: Normocephalic and atraumatic.  ?   Mouth/Throat:  ?   Pharynx: Oropharynx is clear.  ?Eyes:  ?   Extraocular Movements: Extraocular movements intact.  ?   Pupils: Pupils are equal, round, and reactive to light.  ?Cardiovascular:  ?   Rate and Rhythm: Normal rate and regular rhythm.  ?Pulmonary:  ?   Comments: Decreased breath sounds bilaterally.  ?Abdominal:  ?   Palpations: Abdomen is soft.  ?Musculoskeletal:     ?   General: Normal range of motion.  ?   Cervical back: Normal range of motion.  ?Skin: ?   General: Skin is warm.  ?Neurological:  ?   General: No focal deficit present.  ?   Mental Status: She is alert and oriented to person, place, and time.  ?Psychiatric:     ?   Behavior: Behavior normal.     ?   Judgment: Judgment normal.  ? ? ? ?LABORATORY DATA:  ?I have reviewed the data as listed ?   ?Component Value Date/Time  ? NA 138 01/07/2022 1512  ? K 3.4 (L) 01/07/2022 1512  ? K 3.9 05/27/2015 0615  ? CL 102 01/07/2022 1512  ? CO2 28 01/07/2022 1512  ? GLUCOSE 97 01/07/2022 1512  ? BUN 12 01/07/2022 1512  ? CREATININE 0.71 01/07/2022 1512  ? CREATININE 0.81 04/21/2014 1629  ? CALCIUM 9.0 01/07/2022 1512  ? CALCIUM 9.3 04/21/2014 1629  ? PROT 7.6 01/07/2022 1512  ? ALBUMIN 4.0 01/07/2022 1512  ? AST 15 01/07/2022 1512  ? ALT 14 01/07/2022 1512  ? ALKPHOS 68 01/07/2022 1512  ? BILITOT 0.7 01/07/2022 1512  ? GFRNONAA >60 01/07/2022 1512  ?  GFRNONAA >60 04/21/2014 1629  ? GFRAA >60 01/08/2020 1547  ? GFRAA >60 04/21/2014 1629  ? ? ?No results found for: SPEP, UPEP ? ?Lab Results  ?Component Value Date  ? WBC 6.9 01/07/2022  ? NEUTROABS 3.2 01/07/2022  ? HGB 13.0 01/07/2022  ? HCT 39.7 01/07/2022  ? MCV 87.4 01/07/2022  ? PLT 453 (H) 01/07/2022  ? ? ?  Chemistry   ?   ?Component Value Date/Time  ? NA 138 01/07/2022 1512  ? K 3.4 (L) 01/07/2022 1512  ? K 3.9 05/27/2015 0615  ? CL 102 01/07/2022 1512  ?  CO2 28 01/07/2022 1512  ? BUN 12 01/07/2022 1512  ? CREATININE 0.71 01/07/2022 1512  ? CREATININE 0.81 04/21/2014 1629  ?    ?Component Value Date/Time  ? CALCIUM 9.0 01/07/2022 1512  ? CALCIUM 9.3 04/21/2014 1629  ? ALKPHOS 68 01/07/2022 1512  ? AST 15 01/07/2022 1512  ? ALT 14 01/07/2022 1512  ? BILITOT 0.7 01/07/2022 1512  ?  ? ? ?RADIOGRAPHIC STUDIES: ?I have personally reviewed the radiological images as listed and agreed with the findings in the report. ?No results found.  ? ?ASSESSMENT & PLAN:  ?MGUS (monoclonal gammopathy of unknown significance) ?Asymptomatic. Patient's M protein in March 2022 0.4g/dL normal kappa Lambda light chain ratio. CBC CMP today are normal. Myeloma panel/ lambda light chain monoclonal protein pending. ? ?#I again had a long conversation with the patient regarding the pathophysiology of MGUS/small risk of turning into multiple myeloma about 1 %/year.  ? ?#Mild hypokalemia potassium 3.4-likely secondary to hydrochlorothiazide.  Dietary intervention discussed. ? ?# Thrombocytosis: 450-460- low risk of MPN/ ?  Reactive.  Check MPN panel.  Also check iron studies. ? ?# DISPOSITION: ?# follow up in 6 months- MD; 1 week prior- labs- cbc/cmp;MM panel; k/l light chains; iron studies/ferritin-- Dr.B ? ? ?Orders Placed This Encounter  ?Procedures  ? CBC with Differential/Platelet  ?  Standing Status:   Future  ?  Standing Expiration Date:   01/08/2023  ? Comprehensive metabolic panel  ?  Standing Status:   Future  ?  Standing  Expiration Date:   01/08/2023  ? Multiple Myeloma Panel (SPEP&IFE w/QIG)  ?  Standing Status:   Future  ?  Standing Expiration Date:   01/08/2023  ? Kappa/lambda light chains  ?  Standing Status:   Future  ?  Standing Expiration Da

## 2022-01-10 LAB — KAPPA/LAMBDA LIGHT CHAINS
Kappa free light chain: 22.2 mg/L — ABNORMAL HIGH (ref 3.3–19.4)
Kappa, lambda light chain ratio: 1.95 — ABNORMAL HIGH (ref 0.26–1.65)
Lambda free light chains: 11.4 mg/L (ref 5.7–26.3)

## 2022-01-12 LAB — PROTEIN ELECTROPHORESIS, SERUM
A/G Ratio: 1.1 (ref 0.7–1.7)
Albumin ELP: 3.7 g/dL (ref 2.9–4.4)
Alpha-1-Globulin: 0.2 g/dL (ref 0.0–0.4)
Alpha-2-Globulin: 0.6 g/dL (ref 0.4–1.0)
Beta Globulin: 1.2 g/dL (ref 0.7–1.3)
Gamma Globulin: 1.3 g/dL (ref 0.4–1.8)
Globulin, Total: 3.3 g/dL (ref 2.2–3.9)
M-Spike, %: 0.3 g/dL — ABNORMAL HIGH
Total Protein ELP: 7 g/dL (ref 6.0–8.5)

## 2022-01-13 ENCOUNTER — Other Ambulatory Visit: Payer: Self-pay | Admitting: Internal Medicine

## 2022-01-13 DIAGNOSIS — D75839 Thrombocytosis, unspecified: Secondary | ICD-10-CM

## 2022-01-17 ENCOUNTER — Other Ambulatory Visit: Payer: Self-pay | Admitting: Internal Medicine

## 2022-01-17 DIAGNOSIS — Z1231 Encounter for screening mammogram for malignant neoplasm of breast: Secondary | ICD-10-CM

## 2022-02-15 ENCOUNTER — Ambulatory Visit
Admission: RE | Admit: 2022-02-15 | Discharge: 2022-02-15 | Disposition: A | Discharge status: Home / Self Care | Payer: BC Managed Care – PPO | Source: Ambulatory Visit | Attending: Internal Medicine | Admitting: Internal Medicine

## 2022-02-15 DIAGNOSIS — Z1231 Encounter for screening mammogram for malignant neoplasm of breast: Secondary | ICD-10-CM | POA: Diagnosis not present

## 2022-02-21 ENCOUNTER — Encounter: Payer: Self-pay | Admitting: Internal Medicine

## 2022-02-21 ENCOUNTER — Ambulatory Visit: Payer: BC Managed Care – PPO | Admitting: Internal Medicine

## 2022-02-21 VITALS — BP 118/72 | HR 84 | Temp 97.9°F | Resp 22 | Ht 65.0 in | Wt 201.4 lb

## 2022-02-21 DIAGNOSIS — E7800 Pure hypercholesterolemia, unspecified: Secondary | ICD-10-CM

## 2022-02-21 DIAGNOSIS — D472 Monoclonal gammopathy: Secondary | ICD-10-CM

## 2022-02-21 DIAGNOSIS — K219 Gastro-esophageal reflux disease without esophagitis: Secondary | ICD-10-CM

## 2022-02-21 DIAGNOSIS — T7840XD Allergy, unspecified, subsequent encounter: Secondary | ICD-10-CM

## 2022-02-21 DIAGNOSIS — D649 Anemia, unspecified: Secondary | ICD-10-CM | POA: Diagnosis not present

## 2022-02-21 DIAGNOSIS — I10 Essential (primary) hypertension: Secondary | ICD-10-CM

## 2022-02-21 DIAGNOSIS — F439 Reaction to severe stress, unspecified: Secondary | ICD-10-CM

## 2022-02-21 DIAGNOSIS — R221 Localized swelling, mass and lump, neck: Secondary | ICD-10-CM

## 2022-02-21 DIAGNOSIS — D75839 Thrombocytosis, unspecified: Secondary | ICD-10-CM

## 2022-02-21 LAB — HEPATIC FUNCTION PANEL
ALT: 12 U/L (ref 0–35)
AST: 12 U/L (ref 0–37)
Albumin: 4.3 g/dL (ref 3.5–5.2)
Alkaline Phosphatase: 67 U/L (ref 39–117)
Bilirubin, Direct: 0.1 mg/dL (ref 0.0–0.3)
Total Bilirubin: 0.9 mg/dL (ref 0.2–1.2)
Total Protein: 7.2 g/dL (ref 6.0–8.3)

## 2022-02-21 LAB — LIPID PANEL
Cholesterol: 260 mg/dL — ABNORMAL HIGH (ref 0–200)
HDL: 81.1 mg/dL (ref >39.00)
LDL Cholesterol: 161 mg/dL — ABNORMAL HIGH (ref 0–99)
NonHDL: 178.72
Total CHOL/HDL Ratio: 3
Triglycerides: 91 mg/dL (ref 0.0–149.0)
VLDL: 18.2 mg/dL (ref 0.0–40.0)

## 2022-02-21 LAB — TSH: TSH: 1.25 u[IU]/mL (ref 0.35–5.50)

## 2022-02-21 LAB — BASIC METABOLIC PANEL
BUN: 12 mg/dL (ref 6–23)
CO2: 28 mEq/L (ref 19–32)
Calcium: 10.3 mg/dL (ref 8.4–10.5)
Chloride: 103 mEq/L (ref 96–112)
Creatinine, Ser: 0.69 mg/dL (ref 0.40–1.20)
GFR: 96.11 mL/min (ref >60.00)
Glucose, Bld: 86 mg/dL (ref 70–99)
Potassium: 4.1 mEq/L (ref 3.5–5.1)
Sodium: 140 mEq/L (ref 135–145)

## 2022-02-21 NOTE — Progress Notes (Signed)
Patient ID: Kathleen Gould, female   DOB: 11/25/1965, 56 y.o.   MRN: [id number]   Subjective:    Patient ID: Kathleen Gould, female    DOB: [birth year], 56 y.o.   MRN: [id number]   Patient here for a scheduled follow up.   Chief Complaint  Patient presents with   Hypertension   Hyperlipidemia   Anemia   .   HPI She is doing relatively well.  School is out.  Discussed increased stress. Overall she feels she is handling things relatively well.  No chest pain or sob reported.  No abdominal pain.  Bowels stable.  Saw Dr Donneta Romberg 5/12.  Recommended f/u in 6 months.  Was recently evaluated 01/03/22 - allergic reaction. Had generalized pruritus 5/5 and transient lip swelling along with vaginal itching.  Was treated with solumedrol and then medrol dose pak.  Has not had recurrence.  Unclear etiology.  No change in medications or foods.  Blood pressure doing well.    Past Medical History:  Diagnosis Date   Arthritis    Endometriosis    Environmental allergies    GERD (gastroesophageal reflux disease)    Hypercholesterolemia    Hypertension    Uterine fibroid    Past Surgical History:  Procedure Laterality Date   ABDOMINAL HYSTERECTOMY     CESAREAN SECTION     x2   OOPHORECTOMY     S/P left secondary to cyst   THYROGLOSSAL DUCT CYST N/A 05/27/2015   Procedure: THYROGLOSSAL DUCT CYST EXCISION ;  Surgeon: Geanie Logan, MD;  Location: [facility] ORS;  Service: ENT;  Laterality: N/A;   Family History  Problem Relation Age of Onset   Heart attack Father 4   Heart disease Father        bypass surgery   Hypertension Father    Hypercholesterolemia Father    Kidney disease Father    Diabetes Mother    Diabetes Other        aunt   Breast cancer Neg Hx    Social History   Socioeconomic History   Marital status: Married    Spouse name: Not on file   Number of children: 2   Years of education: Not on file   Highest education level: Not on file  Occupational History   Not on  file  Tobacco Use   Smoking status: Never   Smokeless tobacco: Never  Vaping Use   Vaping Use: Never used  Substance and Sexual Activity   Alcohol use: No    Alcohol/week: 0.0 standard drinks of alcohol   Drug use: No   Sexual activity: Not Currently  Other Topics Concern   Not on file  Social History Narrative   Widowed    Social Determinants of Health   Financial Resource Strain: Not on file  Food Insecurity: Not on file  Transportation Needs: Not on file  Physical Activity: Not on file  Stress: Not on file  Social Connections: Not on file     Review of Systems  Constitutional:  Negative for appetite change and unexpected weight change.  HENT:  Negative for congestion and sinus pressure.   Respiratory:  Negative for cough, chest tightness and shortness of breath.   Cardiovascular:  Negative for chest pain, palpitations and leg swelling.  Gastrointestinal:  Negative for abdominal pain, diarrhea, nausea and vomiting.  Genitourinary:  Negative for difficulty urinating and dysuria.  Musculoskeletal:  Negative for joint swelling and myalgias.  Skin:  Negative for color change and rash.  Neurological:  Negative for dizziness, light-headedness and headaches.  Psychiatric/Behavioral:  Negative for agitation and dysphoric mood.        Objective:     BP 118/72   Pulse 84   Temp 97.9 F (36.6 C) (Oral)   Resp (!) 22   Ht 5\' 5"  (1.651 m)   Wt 201 lb 6.4 oz (91.4 kg)   LMP 11/06/2000   SpO2 99%   BMI 33.51 kg/m  Wt Readings from Last 3 Encounters:  02/21/22 201 lb 6.4 oz (91.4 kg)  01/07/22 202 lb (91.6 kg)  07/27/21 200 lb 9.6 oz (91 kg)    Physical Exam Vitals reviewed.  Constitutional:      General: She is not in acute distress.    Appearance: Normal appearance.  HENT:     Head: Normocephalic and atraumatic.     Right Ear: External ear normal.     Left Ear: External ear normal.  Eyes:     General: No scleral icterus.       Right eye: No discharge.         Left eye: No discharge.     Conjunctiva/sclera: Conjunctivae normal.  Neck:     Thyroid: No thyromegaly.  Cardiovascular:     Rate and Rhythm: Normal rate and regular rhythm.  Pulmonary:     Effort: No respiratory distress.     Breath sounds: Normal breath sounds. No wheezing.  Abdominal:     General: Bowel sounds are normal.     Palpations: Abdomen is soft.     Tenderness: There is no abdominal tenderness.  Musculoskeletal:        General: No swelling or tenderness.     Cervical back: Neck supple. No tenderness.  Lymphadenopathy:     Cervical: No cervical adenopathy.  Skin:    Findings: No erythema or rash.  Neurological:     Mental Status: She is alert.  Psychiatric:        Mood and Affect: Mood normal.        Behavior: Behavior normal.      Outpatient Encounter Medications as of 02/21/2022  Medication Sig   atorvastatin (LIPITOR) 20 MG tablet TAKE 1 TABLET BY MOUTH EVERY DAY   felodipine (PLENDIL) 5 MG 24 hr tablet TAKE 1 TABLET BY MOUTH EVERY DAY   fluticasone (FLONASE) 50 MCG/ACT nasal spray SPRAY 2 SPRAYS INTO EACH NOSTRIL EVERY DAY   pantoprazole (PROTONIX) 40 MG tablet TAKE 1 TABLET BY MOUTH EVERY DAY   triamterene-hydrochlorothiazide (MAXZIDE-25) 37.5-25 MG tablet Take 0.5 tablets by mouth daily.   No facility-administered encounter medications on file as of 02/21/2022.     Lab Results  Component Value Date   WBC 6.9 01/07/2022   HGB 13.0 01/07/2022   HCT 39.7 01/07/2022   PLT 453 (H) 01/07/2022   GLUCOSE 86 02/21/2022   CHOL 260 (H) 02/21/2022   TRIG 91.0 02/21/2022   HDL 81.10 02/21/2022   LDLDIRECT 164.5 07/24/2013   LDLCALC 161 (H) 02/21/2022   ALT 12 02/21/2022   AST 12 02/21/2022   NA 140 02/21/2022   K 4.1 02/21/2022   CL 103 02/21/2022   CREATININE 0.69 02/21/2022   BUN 12 02/21/2022   CO2 28 02/21/2022   TSH 1.25 02/21/2022   INR 1.0 04/21/2014    MM 3D SCREEN BREAST BILATERAL  Result Date: 02/16/2022 CLINICAL DATA:  Screening. EXAM:  DIGITAL SCREENING BILATERAL MAMMOGRAM WITH TOMOSYNTHESIS AND CAD TECHNIQUE: Bilateral screening digital craniocaudal and mediolateral oblique mammograms were obtained.  Bilateral screening digital breast tomosynthesis was performed. The images were evaluated with computer-aided detection. COMPARISON:  Previous exam(s). ACR Breast Density Category b: There are scattered areas of fibroglandular density. FINDINGS: There are no findings suspicious for malignancy. IMPRESSION: No mammographic evidence of malignancy. A result letter of this screening mammogram will be mailed directly to the patient. RECOMMENDATION: Screening mammogram in one year. (Code:SM-B-01Y) BI-RADS CATEGORY  1: Negative. Electronically Signed   By: Baird Lyons M.D.   On: 02/16/2022 11:23       Assessment & Plan:   Problem List Items Addressed This Visit     Allergic reaction    Recently evaluated for allergic reaction as outlined.  Unclear etiology.  Discuss if desires allergy referral.  Monitor.       Anemia    Followed by hematology.       Esophageal reflux    On protonix.  No upper symptoms reported.        Essential hypertension, benign    Continue plendil and triam/hctz.  Blood pressure doing well.  Follow pressures.  Follow metabolic panel.       Relevant Orders   Basic Metabolic Panel (BMET) (Completed)   Monoclonal gammopathy    Followed by Dr Donneta Romberg.  Just evaluated 12/2021.  Recommended f/u in 6 months.        Neck nodule    Followed by ENT.  Just evaluated.  Recommended continued monitoring - one year.        Pure hypercholesterolemia - Primary    On lipitor.  Low cholesterol diet and exercise.  Follow lipid panel and liver function tests.        Relevant Orders   Lipid Profile (Completed)   Hepatic function panel (Completed)   TSH (Completed)   Stress    Overall appears to be handling things relatively well.  Follow.        Thrombocytosis    Being followed by hematology.  Felt to be  reactive.         Dale [place], MD

## 2022-02-22 ENCOUNTER — Encounter: Payer: Self-pay | Admitting: Internal Medicine

## 2022-02-22 DIAGNOSIS — D472 Monoclonal gammopathy: Secondary | ICD-10-CM | POA: Insufficient documentation

## 2022-02-22 DIAGNOSIS — T7840XA Allergy, unspecified, initial encounter: Secondary | ICD-10-CM | POA: Insufficient documentation

## 2022-03-30 ENCOUNTER — Other Ambulatory Visit: Payer: Self-pay | Admitting: Internal Medicine

## 2022-03-30 DIAGNOSIS — K219 Gastro-esophageal reflux disease without esophagitis: Secondary | ICD-10-CM

## 2022-05-25 ENCOUNTER — Other Ambulatory Visit: Payer: Self-pay | Admitting: Internal Medicine

## 2022-06-23 ENCOUNTER — Encounter: Payer: Self-pay | Admitting: Internal Medicine

## 2022-06-23 ENCOUNTER — Ambulatory Visit (INDEPENDENT_AMBULATORY_CARE_PROVIDER_SITE_OTHER): Payer: BC Managed Care – PPO | Admitting: Internal Medicine

## 2022-06-23 VITALS — BP 128/70 | HR 83 | Temp 98.0°F | Ht 65.0 in | Wt 203.8 lb

## 2022-06-23 DIAGNOSIS — F439 Reaction to severe stress, unspecified: Secondary | ICD-10-CM

## 2022-06-23 DIAGNOSIS — K219 Gastro-esophageal reflux disease without esophagitis: Secondary | ICD-10-CM

## 2022-06-23 DIAGNOSIS — Z23 Encounter for immunization: Secondary | ICD-10-CM

## 2022-06-23 DIAGNOSIS — Z0000 Encounter for general adult medical examination without abnormal findings: Secondary | ICD-10-CM

## 2022-06-23 DIAGNOSIS — I10 Essential (primary) hypertension: Secondary | ICD-10-CM

## 2022-06-23 DIAGNOSIS — D472 Monoclonal gammopathy: Secondary | ICD-10-CM

## 2022-06-23 DIAGNOSIS — E7800 Pure hypercholesterolemia, unspecified: Secondary | ICD-10-CM

## 2022-06-23 DIAGNOSIS — D649 Anemia, unspecified: Secondary | ICD-10-CM

## 2022-06-23 DIAGNOSIS — D75839 Thrombocytosis, unspecified: Secondary | ICD-10-CM

## 2022-06-23 NOTE — Progress Notes (Signed)
Patient ID: Kathleen Gould, female   DOB: [birth year], 56 y.o.   MRN: [id number]   Subjective:    Patient ID: Kathleen Gould, female    DOB: 06/26/1966, 56 y.o.   MRN: [id number]   Patient here for  Chief Complaint  Patient presents with   Annual Exam   .   HPI Increased stress - work stress.  Discussed.  Overall she feels she is doing relatively well.  Tries to stay active.  No chest pain or sob reported.  No abdominal pain or bowel change reported.  Seeing Dr Rogue Bussing - f/u MGUS.     Past Medical History:  Diagnosis Date   Arthritis    Endometriosis    Environmental allergies    GERD (gastroesophageal reflux disease)    Hypercholesterolemia    Hypertension    Uterine fibroid    Past Surgical History:  Procedure Laterality Date   ABDOMINAL HYSTERECTOMY     CESAREAN SECTION     x2   OOPHORECTOMY     S/P left secondary to cyst   THYROGLOSSAL DUCT CYST N/A 05/27/2015   Procedure: THYROGLOSSAL DUCT CYST EXCISION ;  Surgeon: Clyde Canterbury, MD;  Location: [facility] ORS;  Service: ENT;  Laterality: N/A;   Family History  Problem Relation Age of Onset   Heart attack Father 62   Heart disease Father        bypass surgery   Hypertension Father    Hypercholesterolemia Father    Kidney disease Father    Diabetes Mother    Diabetes Other        aunt   Breast cancer Neg Hx    Social History   Socioeconomic History   Marital status: Married    Spouse name: Not on file   Number of children: 2   Years of education: Not on file   Highest education level: Not on file  Occupational History   Not on file  Tobacco Use   Smoking status: Never   Smokeless tobacco: Never  Vaping Use   Vaping Use: Never used  Substance and Sexual Activity   Alcohol use: No    Alcohol/week: 0.0 standard drinks of alcohol   Drug use: No   Sexual activity: Not Currently  Other Topics Concern   Not on file  Social History Narrative   Widowed    Social Determinants of Health    Financial Resource Strain: Not on file  Food Insecurity: Not on file  Transportation Needs: Not on file  Physical Activity: Not on file  Stress: Not on file  Social Connections: Not on file     Review of Systems  Constitutional:  Negative for appetite change and unexpected weight change.  HENT:  Negative for congestion, sinus pressure and sore throat.   Eyes:  Negative for pain and visual disturbance.  Respiratory:  Negative for cough, chest tightness and shortness of breath.   Cardiovascular:  Negative for chest pain, palpitations and leg swelling.  Gastrointestinal:  Negative for abdominal pain, diarrhea, nausea and vomiting.  Genitourinary:  Negative for difficulty urinating and dysuria.  Musculoskeletal:  Negative for joint swelling and myalgias.  Skin:  Negative for color change and rash.  Neurological:  Negative for dizziness, light-headedness and headaches.  Hematological:  Negative for adenopathy. Does not bruise/bleed easily.  Psychiatric/Behavioral:  Negative for agitation and dysphoric mood.        Objective:     BP 128/70 (BP Location: Left Arm, Patient Position: Sitting, Cuff Size: Normal)  Pulse 83   Temp 98 F (36.7 C) (Oral)   Ht '5\' 5"'$  (1.651 m)   Wt 203 lb 12.8 oz (92.4 kg)   LMP 11/06/2000   SpO2 99%   BMI 33.91 kg/m  Wt Readings from Last 3 Encounters:  06/23/22 203 lb 12.8 oz (92.4 kg)  02/21/22 201 lb 6.4 oz (91.4 kg)  01/07/22 202 lb (91.6 kg)    Physical Exam Vitals reviewed.  Constitutional:      General: She is not in acute distress.    Appearance: Normal appearance. She is well-developed.  HENT:     Head: Normocephalic and atraumatic.     Right Ear: External ear normal.     Left Ear: External ear normal.  Eyes:     General: No scleral icterus.       Right eye: No discharge.        Left eye: No discharge.     Conjunctiva/sclera: Conjunctivae normal.  Neck:     Thyroid: No thyromegaly.  Cardiovascular:     Rate and Rhythm:  Normal rate and regular rhythm.  Pulmonary:     Effort: No tachypnea, accessory muscle usage or respiratory distress.     Breath sounds: Normal breath sounds. No decreased breath sounds or wheezing.  Chest:  Breasts:    Right: No inverted nipple, mass, nipple discharge or tenderness (no axillary adenopathy).     Left: No inverted nipple, mass, nipple discharge or tenderness (no axilarry adenopathy).  Abdominal:     General: Bowel sounds are normal.     Palpations: Abdomen is soft.     Tenderness: There is no abdominal tenderness.  Musculoskeletal:        General: No swelling or tenderness.     Cervical back: Neck supple.  Lymphadenopathy:     Cervical: No cervical adenopathy.  Skin:    Findings: No erythema or rash.  Neurological:     Mental Status: She is alert and oriented to person, place, and time.  Psychiatric:        Mood and Affect: Mood normal.        Behavior: Behavior normal.      Outpatient Encounter Medications as of 06/23/2022  Medication Sig   atorvastatin (LIPITOR) 20 MG tablet TAKE 1 TABLET BY MOUTH EVERY DAY   felodipine (PLENDIL) 5 MG 24 hr tablet TAKE 1 TABLET BY MOUTH EVERY DAY   fluticasone (FLONASE) 50 MCG/ACT nasal spray SPRAY 2 SPRAYS INTO EACH NOSTRIL EVERY DAY   pantoprazole (PROTONIX) 40 MG tablet TAKE 1 TABLET BY MOUTH EVERY DAY   triamterene-hydrochlorothiazide (MAXZIDE-25) 37.5-25 MG tablet Take 0.5 tablets by mouth daily.   No facility-administered encounter medications on file as of 06/23/2022.     Lab Results  Component Value Date   WBC 6.9 01/07/2022   HGB 13.0 01/07/2022   HCT 39.7 01/07/2022   PLT 453 (H) 01/07/2022   GLUCOSE 86 02/21/2022   CHOL 260 (H) 02/21/2022   TRIG 91.0 02/21/2022   HDL 81.10 02/21/2022   LDLDIRECT 164.5 07/24/2013   LDLCALC 161 (H) 02/21/2022   ALT 12 02/21/2022   AST 12 02/21/2022   NA 140 02/21/2022   K 4.1 02/21/2022   CL 103 02/21/2022   CREATININE 0.69 02/21/2022   BUN 12 02/21/2022   CO2 28  02/21/2022   TSH 1.25 02/21/2022   INR 1.0 04/21/2014    MM 3D SCREEN BREAST BILATERAL  Result Date: 02/16/2022 CLINICAL DATA:  Screening. EXAM: DIGITAL SCREENING BILATERAL MAMMOGRAM WITH  TOMOSYNTHESIS AND CAD TECHNIQUE: Bilateral screening digital craniocaudal and mediolateral oblique mammograms were obtained. Bilateral screening digital breast tomosynthesis was performed. The images were evaluated with computer-aided detection. COMPARISON:  Previous exam(s). ACR Breast Density Category b: There are scattered areas of fibroglandular density. FINDINGS: There are no findings suspicious for malignancy. IMPRESSION: No mammographic evidence of malignancy. A result letter of this screening mammogram will be mailed directly to the patient. RECOMMENDATION: Screening mammogram in one year. (Code:SM-B-01Y) BI-RADS CATEGORY  1: Negative. Electronically Signed   By: Lillia Mountain M.D.   On: 02/16/2022 11:23       Assessment & Plan:   Problem List Items Addressed This Visit     Anemia    Followed by hematology.       Esophageal reflux    On protonix.  No upper symptoms reported.        Essential hypertension, benign    Continue plendil and triam/hctz.  Blood pressure doing well.  Follow pressures.  Follow metabolic panel.       Health care maintenance    Physical today 06/23/22.  Mammogram 02/16/22 - Birads I.  Colonoscopy 07/2020 - (Dr Lysle Pearl) - recommended f/u in 10 years.  PAP 03/23/21 - negative with negative HPV.       Monoclonal gammopathy    Followed by Dr Rogue Bussing.  Just evaluated 12/2021.  Recommended f/u in 6 months.        Pure hypercholesterolemia    On lipitor.  Low cholesterol diet and exercise.  Follow lipid panel and liver function tests.        Stress    Overall appears to be handling things relatively well.  Follow.        Thrombocytosis    Being followed by hematology.  Felt to be reactive.       Other Visit Diagnoses     Need for immunization against influenza     -  Primary   Relevant Orders   Flu Vaccine QUAD 40moIM (Fluarix, Fluzone & Alfiuria Quad PF) (Completed)        CEinar Pheasant MD

## 2022-07-03 ENCOUNTER — Encounter: Payer: Self-pay | Admitting: Internal Medicine

## 2022-07-03 NOTE — Addendum Note (Signed)
Addended by: Alisa Graff on: 07/03/2022 09:37 AM   Modules accepted: Level of Service

## 2022-07-03 NOTE — Assessment & Plan Note (Signed)
Followed by hematology 

## 2022-07-03 NOTE — Assessment & Plan Note (Signed)
Being followed by hematology.  Felt to be reactive.  

## 2022-07-03 NOTE — Assessment & Plan Note (Signed)
On protonix.  No upper symptoms reported.   

## 2022-07-03 NOTE — Assessment & Plan Note (Signed)
Overall appears to be handling things relatively well.  Follow.   

## 2022-07-03 NOTE — Assessment & Plan Note (Signed)
Followed by Dr Rogue Bussing.  Just evaluated 12/2021.  Recommended f/u in 6 months.

## 2022-07-03 NOTE — Assessment & Plan Note (Signed)
On lipitor.  Low cholesterol diet and exercise.  Follow lipid panel and liver function tests.   

## 2022-07-03 NOTE — Assessment & Plan Note (Signed)
Physical today 06/23/22.  Mammogram 02/16/22 - Birads I.  Colonoscopy 07/2020 - (Dr Lysle Pearl) - recommended f/u in 10 years.  PAP 03/23/21 - negative with negative HPV.

## 2022-07-03 NOTE — Assessment & Plan Note (Signed)
Continue plendil and triam/hctz.  Blood pressure doing well.  Follow pressures.  Follow metabolic panel.  

## 2022-07-08 ENCOUNTER — Other Ambulatory Visit: Payer: BC Managed Care – PPO

## 2022-07-08 ENCOUNTER — Inpatient Hospital Stay: Payer: BC Managed Care – PPO | Attending: Internal Medicine

## 2022-07-08 ENCOUNTER — Inpatient Hospital Stay: Payer: BC Managed Care – PPO

## 2022-07-08 DIAGNOSIS — D472 Monoclonal gammopathy: Secondary | ICD-10-CM | POA: Insufficient documentation

## 2022-07-08 DIAGNOSIS — Z79899 Other long term (current) drug therapy: Secondary | ICD-10-CM | POA: Diagnosis not present

## 2022-07-08 DIAGNOSIS — D75839 Thrombocytosis, unspecified: Secondary | ICD-10-CM

## 2022-07-08 LAB — CBC WITH DIFFERENTIAL/PLATELET
Abs Immature Granulocytes: 0.01 10*3/uL (ref 0.00–0.07)
Basophils Absolute: 0 10*3/uL (ref 0.0–0.1)
Basophils Relative: 1 %
Eosinophils Absolute: 0.2 10*3/uL (ref 0.0–0.5)
Eosinophils Relative: 5 %
HCT: 42.1 % (ref 36.0–46.0)
Hemoglobin: 13.8 g/dL (ref 12.0–15.0)
Immature Granulocytes: 0 %
Lymphocytes Relative: 31 %
Lymphs Abs: 1.6 10*3/uL (ref 0.7–4.0)
MCH: 28.6 pg (ref 26.0–34.0)
MCHC: 32.8 g/dL (ref 30.0–36.0)
MCV: 87.2 fL (ref 80.0–100.0)
Monocytes Absolute: 0.3 10*3/uL (ref 0.1–1.0)
Monocytes Relative: 6 %
Neutro Abs: 3 10*3/uL (ref 1.7–7.7)
Neutrophils Relative %: 57 %
Platelets: 449 10*3/uL — ABNORMAL HIGH (ref 150–400)
RBC: 4.83 MIL/uL (ref 3.87–5.11)
RDW: 14 % (ref 11.5–15.5)
WBC: 5.2 10*3/uL (ref 4.0–10.5)
nRBC: 0 % (ref 0.0–0.2)

## 2022-07-08 LAB — IRON AND TIBC
Iron: 71 ug/dL (ref 28–170)
Saturation Ratios: 18 % (ref 10.4–31.8)
TIBC: 399 ug/dL (ref 250–450)
UIBC: 328 ug/dL

## 2022-07-08 LAB — COMPREHENSIVE METABOLIC PANEL
ALT: 15 U/L (ref 0–44)
AST: 17 U/L (ref 15–41)
Albumin: 4.2 g/dL (ref 3.5–5.0)
Alkaline Phosphatase: 67 U/L (ref 38–126)
Anion gap: 9 (ref 5–15)
BUN: 11 mg/dL (ref 6–20)
CO2: 25 mmol/L (ref 22–32)
Calcium: 9.9 mg/dL (ref 8.9–10.3)
Chloride: 105 mmol/L (ref 98–111)
Creatinine, Ser: 0.71 mg/dL (ref 0.44–1.00)
GFR, Estimated: >60 mL/min (ref >60)
Glucose, Bld: 97 mg/dL (ref 70–99)
Potassium: 3.6 mmol/L (ref 3.5–5.1)
Sodium: 139 mmol/L (ref 135–145)
Total Bilirubin: 0.9 mg/dL (ref 0.3–1.2)
Total Protein: 7.9 g/dL (ref 6.5–8.1)

## 2022-07-08 LAB — FERRITIN: Ferritin: 10 ng/mL — ABNORMAL LOW (ref 11–307)

## 2022-07-11 LAB — KAPPA/LAMBDA LIGHT CHAINS
Kappa free light chain: 27 mg/L — ABNORMAL HIGH (ref 3.3–19.4)
Kappa, lambda light chain ratio: 1.97 — ABNORMAL HIGH (ref 0.26–1.65)
Lambda free light chains: 13.7 mg/L (ref 5.7–26.3)

## 2022-07-12 LAB — MULTIPLE MYELOMA PANEL, SERUM
Albumin SerPl Elph-Mcnc: 3.9 g/dL (ref 2.9–4.4)
Albumin/Glob SerPl: 1.2 (ref 0.7–1.7)
Alpha 1: 0.2 g/dL (ref 0.0–0.4)
Alpha2 Glob SerPl Elph-Mcnc: 0.7 g/dL (ref 0.4–1.0)
B-Globulin SerPl Elph-Mcnc: 1.2 g/dL (ref 0.7–1.3)
Gamma Glob SerPl Elph-Mcnc: 1.2 g/dL (ref 0.4–1.8)
Globulin, Total: 3.3 g/dL (ref 2.2–3.9)
IgA: 286 mg/dL (ref 87–352)
IgG (Immunoglobin G), Serum: 1255 mg/dL (ref 586–1602)
IgM (Immunoglobulin M), Srm: 139 mg/dL (ref 26–217)
M Protein SerPl Elph-Mcnc: 0.4 g/dL — ABNORMAL HIGH
Total Protein ELP: 7.2 g/dL (ref 6.0–8.5)

## 2022-07-13 LAB — JAK2 GENOTYPR

## 2022-07-14 LAB — CALRETICULIN (CALR) MUTATION ANALYSIS

## 2022-07-15 ENCOUNTER — Encounter: Payer: Self-pay | Admitting: Internal Medicine

## 2022-07-15 ENCOUNTER — Inpatient Hospital Stay: Payer: BC Managed Care – PPO | Admitting: Internal Medicine

## 2022-07-15 VITALS — BP 130/77 | HR 83 | Temp 96.6°F | Resp 20 | Wt 201.6 lb

## 2022-07-15 DIAGNOSIS — D472 Monoclonal gammopathy: Secondary | ICD-10-CM

## 2022-07-15 DIAGNOSIS — D509 Iron deficiency anemia, unspecified: Secondary | ICD-10-CM

## 2022-07-15 DIAGNOSIS — D696 Thrombocytopenia, unspecified: Secondary | ICD-10-CM

## 2022-07-15 DIAGNOSIS — D75839 Thrombocytosis, unspecified: Secondary | ICD-10-CM | POA: Diagnosis not present

## 2022-07-15 NOTE — Progress Notes (Signed)
[facility] OFFICE PROGRESS NOTE  Patient Care Team: Einar Pheasant, MD as PCP - General (Internal Medicine) Lequita Asal, MD (Inactive) as Referring Physician (Hematology and Oncology) Efrain Sella, MD as Consulting Physician (Gastroenterology) Rodney Booze as Physician Assistant   Cancer Staging  No matching staging information was found for the patient.  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 Serum LDH, Cr, CBC, Flow Cytometry, Skeletal survey all unremarkable.   # RML lung nodule 48m [June 2018; incidental]  Oncology History   No history exists.     INTERVAL HISTORY:  Kathleen Gould 56y.o.  female pleasant patient above history of MGUS is here for follow-up.   Patient denies fever, chills, nausea, vomiting, shortness of breath, cough, abdominal pain, bleeding, bowel or bladder issues. Energy level is good.  Appetite is good.  Denies any weight loss. Denies pain.   Review of Systems  Constitutional:  Negative for chills, diaphoresis, fever, malaise/fatigue and weight loss.  HENT:  Negative for nosebleeds and sore throat.   Eyes:  Negative for double vision.  Respiratory:  Negative for cough, hemoptysis, sputum production, shortness of breath and wheezing.   Cardiovascular:  Negative for chest pain, palpitations, orthopnea and leg swelling.  Gastrointestinal:  Negative for abdominal pain, blood in stool, constipation, diarrhea, heartburn, melena, nausea and vomiting.  Genitourinary:  Negative for dysuria, frequency and urgency.  Musculoskeletal:  Positive for joint pain. Negative for back pain.  Skin: Negative.  Negative for itching and rash.  Neurological:  Negative for dizziness, tingling, focal weakness, weakness and headaches.  Endo/Heme/Allergies:  Does not bruise/bleed easily.  Psychiatric/Behavioral:   Negative for depression. The patient is not nervous/anxious and does not have insomnia.       PAST MEDICAL HISTORY :  Past Medical History:  Diagnosis Date   Arthritis    Endometriosis    Environmental allergies    GERD (gastroesophageal reflux disease)    Hypercholesterolemia    Hypertension    Uterine fibroid     PAST SURGICAL HISTORY :   Past Surgical History:  Procedure Laterality Date   ABDOMINAL HYSTERECTOMY     CESAREAN SECTION     x2   OOPHORECTOMY     S/P left secondary to cyst   THYROGLOSSAL DUCT CYST N/A 05/27/2015   Procedure: THYROGLOSSAL DUCT CYST EXCISION ;  Surgeon: PClyde Canterbury MD;  Location: [facility] ORS;  Service: ENT;  Laterality: N/A;    FAMILY HISTORY :   Family History  Problem Relation Age of Onset   Heart attack Father 538  Heart disease Father        bypass surgery   Hypertension Father    Hypercholesterolemia Father    Kidney disease Father    Diabetes Mother    Diabetes Other        aunt   Breast cancer Neg Hx     SOCIAL HISTORY:   Social History   Tobacco Use   Smoking status: Never   Smokeless tobacco: Never  Vaping Use   Vaping Use: Never used  Substance Use Topics   Alcohol use: No    Alcohol/week: 0.0 standard drinks of alcohol   Drug use: No    ALLERGIES:  is allergic to cefdinir, levofloxacin, and simvastatin.  MEDICATIONS:  Current Outpatient Medications  Medication Sig Dispense Refill   atorvastatin (LIPITOR) 20 MG tablet TAKE 1 TABLET  BY MOUTH EVERY DAY 90 tablet 3   felodipine (PLENDIL) 5 MG 24 hr tablet TAKE 1 TABLET BY MOUTH EVERY DAY 90 tablet 1   fluticasone (FLONASE) 50 MCG/ACT nasal spray SPRAY 2 SPRAYS INTO EACH NOSTRIL EVERY DAY 48 mL 0   pantoprazole (PROTONIX) 40 MG tablet TAKE 1 TABLET BY MOUTH EVERY DAY 90 tablet 1   triamterene-hydrochlorothiazide (MAXZIDE-25) 37.5-25 MG tablet Take 0.5 tablets by mouth daily. 30 tablet 5   No current facility-administered medications for this visit.    PHYSICAL  EXAMINATION: ECOG PERFORMANCE STATUS: 0 - Asymptomatic  BP 130/77   Pulse 83   Temp (!) 96.6 F (35.9 C)   Resp 20   Wt 201 lb 9.6 oz (91.4 kg)   LMP 11/06/2000   SpO2 100%   BMI 33.55 kg/m   Filed Weights   07/15/22 1512  Weight: 201 lb 9.6 oz (91.4 kg)    Physical Exam Vitals and nursing note reviewed.  HENT:     Head: Normocephalic and atraumatic.     Mouth/Throat:     Pharynx: Oropharynx is clear.  Eyes:     Extraocular Movements: Extraocular movements intact.     Pupils: Pupils are equal, round, and reactive to light.  Cardiovascular:     Rate and Rhythm: Normal rate and regular rhythm.  Pulmonary:     Comments: Decreased breath sounds bilaterally.  Abdominal:     Palpations: Abdomen is soft.  Musculoskeletal:        General: Normal range of motion.     Cervical back: Normal range of motion.  Skin:    General: Skin is warm.  Neurological:     General: No focal deficit present.     Mental Status: She is alert and oriented to person, place, and time.  Psychiatric:        Behavior: Behavior normal.        Judgment: Judgment normal.      LABORATORY DATA:  I have reviewed the data as listed    Component Value Date/Time   NA 139 07/08/2022 1018   K 3.6 07/08/2022 1018   K 3.9 05/27/2015 0615   CL 105 07/08/2022 1018   CO2 25 07/08/2022 1018   GLUCOSE 97 07/08/2022 1018   BUN 11 07/08/2022 1018   CREATININE 0.71 07/08/2022 1018   CREATININE 0.81 04/21/2014 1629   CALCIUM 9.9 07/08/2022 1018   CALCIUM 9.3 04/21/2014 1629   PROT 7.9 07/08/2022 1018   ALBUMIN 4.2 07/08/2022 1018   AST 17 07/08/2022 1018   ALT 15 07/08/2022 1018   ALKPHOS 67 07/08/2022 1018   BILITOT 0.9 07/08/2022 1018   GFRNONAA >60 07/08/2022 1018   GFRNONAA >60 04/21/2014 1629   GFRAA >60 01/08/2020 1547   GFRAA >60 04/21/2014 1629    No results found for: SPEP, UPEP  Lab Results  Component Value Date   WBC 5.2 07/08/2022   NEUTROABS 3.0 07/08/2022   HGB 13.8 07/08/2022    HCT 42.1 07/08/2022   MCV 87.2 07/08/2022   PLT 449 (H) 07/08/2022      Chemistry      Component Value Date/Time   NA 139 07/08/2022 1018   K 3.6 07/08/2022 1018   K 3.9 05/27/2015 0615   CL 105 07/08/2022 1018   CO2 25 07/08/2022 1018   BUN 11 07/08/2022 1018   CREATININE 0.71 07/08/2022 1018   CREATININE 0.81 04/21/2014 1629      Component Value Date/Time   CALCIUM 9.9 07/08/2022  1018   CALCIUM 9.3 04/21/2014 1629   ALKPHOS 67 07/08/2022 1018   AST 17 07/08/2022 1018   ALT 15 07/08/2022 1018   BILITOT 0.9 07/08/2022 1018      RADIOGRAPHIC STUDIES: I have personally reviewed the radiological images as listed and agreed with the findings in the report. No results found.   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  Labs reviewed and stable.  She will continue with surveillance annually.   # Thrombocytosis: JAK2 and CAL are negative.  MPL not done.  Iron panel showed ferritin of 10.  Discussed with the patient about iron deficiency.  We will proceed with IV Feraheme 550 mg weekly x2.  She had colonoscopy in 2021 which was unremarkable.  Discussed about further GI work-up to assess for bleeding source.  Patient would like to think about it and will discuss further with Dr. B during the next visit.   # DISPOSITION: #IV Feraheme weekly x2 #Follow-up with Dr. B in 3 months, iron studies   Orders Placed This Encounter  Procedures   CBC with Differential/Platelet    Standing Status:   Future    Standing Expiration Date:   07/16/2023   Comprehensive metabolic panel    Standing Status:   Future    Standing Expiration Date:   07/16/2023   Kappa/lambda light chains    Standing Status:   Future    Standing Expiration Date:   07/15/2023   Protein electrophoresis, serum    Standing Status:   Future    Standing Expiration Date:   07/15/2023   Iron and TIBC([facility])    Standing Status:   Future    Standing  Expiration Date:   07/16/2023   Ferritin    Standing Status:   Future    Standing Expiration Date:   07/16/2023   All questions were answered. The patient knows to call the clinic with any problems, questions or concerns.      Jane Canary, MD 07/15/2022 4:30 PM

## 2022-07-18 LAB — MPL MUTATION ANALYSIS

## 2022-07-20 ENCOUNTER — Inpatient Hospital Stay: Payer: BC Managed Care – PPO

## 2022-07-20 VITALS — BP 114/68 | HR 67 | Temp 97.1°F | Resp 16

## 2022-07-20 DIAGNOSIS — D509 Iron deficiency anemia, unspecified: Secondary | ICD-10-CM

## 2022-07-20 DIAGNOSIS — D472 Monoclonal gammopathy: Secondary | ICD-10-CM | POA: Diagnosis not present

## 2022-07-20 MED ORDER — SODIUM CHLORIDE 0.9 % IV SOLN
Freq: Once | INTRAVENOUS | Status: AC
  Filled 2022-07-20: qty 250

## 2022-07-20 MED ORDER — SODIUM CHLORIDE 0.9 % IV SOLN
510.0000 mg | Freq: Once | INTRAVENOUS | Status: AC
  Administered 2022-07-20: 510 mg via INTRAVENOUS
  Filled 2022-07-20: qty 510

## 2022-07-20 NOTE — Patient Instructions (Signed)

## 2022-07-25 ENCOUNTER — Other Ambulatory Visit: Payer: Self-pay | Admitting: Internal Medicine

## 2022-07-25 ENCOUNTER — Telehealth: Payer: Self-pay | Admitting: *Deleted

## 2022-07-25 NOTE — Telephone Encounter (Signed)
Patient called reporting that she had iron infusion last week and now she has aching in her back and joints as well as nausea. Please advise

## 2022-07-26 ENCOUNTER — Encounter: Payer: Self-pay | Admitting: Internal Medicine

## 2022-07-26 NOTE — Telephone Encounter (Signed)
I called and spoke with patient and informed her of change in iron brand and she confirmed her appointment for 11/30/223

## 2022-07-28 ENCOUNTER — Inpatient Hospital Stay: Payer: BC Managed Care – PPO

## 2022-07-28 VITALS — BP 128/77 | HR 61 | Temp 96.2°F | Resp 18

## 2022-07-28 DIAGNOSIS — D509 Iron deficiency anemia, unspecified: Secondary | ICD-10-CM

## 2022-07-28 DIAGNOSIS — D472 Monoclonal gammopathy: Secondary | ICD-10-CM | POA: Diagnosis not present

## 2022-07-28 MED ORDER — SODIUM CHLORIDE 0.9 % IV SOLN
200.0000 mg | Freq: Once | INTRAVENOUS | Status: AC
  Administered 2022-07-28: 200 mg via INTRAVENOUS
  Filled 2022-07-28: qty 200

## 2022-07-28 MED ORDER — SODIUM CHLORIDE 0.9 % IV SOLN
Freq: Once | INTRAVENOUS | Status: AC
  Filled 2022-07-28: qty 250

## 2022-07-28 NOTE — Progress Notes (Signed)
1455: Pt reports she is still experiencing nausea and back and joint pain. Pt would like to continue with Iron infusion and declines seeing [facility]. Per Dr. Burlene Arnt okay to proceed with Venofer.  1600 pt tolerated infusion well. Pt and VS stable.  Pt educated to call clinic with any concerns. Pt verbalizes understanding.

## 2022-07-29 ENCOUNTER — Telehealth: Payer: Self-pay | Admitting: Internal Medicine

## 2022-07-29 NOTE — Telephone Encounter (Signed)
I called on patient to check after her Venofer infusion.  Patient feels well with no infusion reactions.  Will follow-up as planned.

## 2022-08-11 ENCOUNTER — Encounter: Payer: Self-pay | Admitting: Internal Medicine

## 2022-08-11 ENCOUNTER — Telehealth: Payer: Self-pay | Admitting: *Deleted

## 2022-08-11 ENCOUNTER — Other Ambulatory Visit: Payer: Self-pay | Admitting: Internal Medicine

## 2022-08-11 NOTE — Telephone Encounter (Signed)
Patient called reporting that she is still having nausea an back pain since getting her Oron infusion and is asking if this is normal. Please advise

## 2022-09-24 ENCOUNTER — Other Ambulatory Visit: Payer: Self-pay | Admitting: Internal Medicine

## 2022-09-24 DIAGNOSIS — K219 Gastro-esophageal reflux disease without esophagitis: Secondary | ICD-10-CM

## 2022-10-14 ENCOUNTER — Inpatient Hospital Stay: Payer: BC Managed Care – PPO | Attending: Internal Medicine

## 2022-10-14 DIAGNOSIS — E876 Hypokalemia: Secondary | ICD-10-CM | POA: Insufficient documentation

## 2022-10-14 DIAGNOSIS — D75839 Thrombocytosis, unspecified: Secondary | ICD-10-CM

## 2022-10-14 DIAGNOSIS — E611 Iron deficiency: Secondary | ICD-10-CM | POA: Diagnosis not present

## 2022-10-14 DIAGNOSIS — D472 Monoclonal gammopathy: Secondary | ICD-10-CM | POA: Diagnosis present

## 2022-10-14 DIAGNOSIS — Z79899 Other long term (current) drug therapy: Secondary | ICD-10-CM | POA: Diagnosis not present

## 2022-10-14 DIAGNOSIS — D509 Iron deficiency anemia, unspecified: Secondary | ICD-10-CM

## 2022-10-14 LAB — COMPREHENSIVE METABOLIC PANEL
ALT: 16 U/L (ref 0–44)
AST: 17 U/L (ref 15–41)
Albumin: 4.2 g/dL (ref 3.5–5.0)
Alkaline Phosphatase: 65 U/L (ref 38–126)
Anion gap: 10 (ref 5–15)
BUN: 11 mg/dL (ref 6–20)
CO2: 26 mmol/L (ref 22–32)
Calcium: 9.5 mg/dL (ref 8.9–10.3)
Chloride: 103 mmol/L (ref 98–111)
Creatinine, Ser: 0.72 mg/dL (ref 0.44–1.00)
GFR, Estimated: >60 mL/min (ref >60)
Glucose, Bld: 111 mg/dL — ABNORMAL HIGH (ref 70–99)
Potassium: 3.3 mmol/L — ABNORMAL LOW (ref 3.5–5.1)
Sodium: 139 mmol/L (ref 135–145)
Total Bilirubin: 0.9 mg/dL (ref 0.3–1.2)
Total Protein: 7.6 g/dL (ref 6.5–8.1)

## 2022-10-14 LAB — CBC WITH DIFFERENTIAL/PLATELET
Abs Immature Granulocytes: 0.01 10*3/uL (ref 0.00–0.07)
Basophils Absolute: 0 10*3/uL (ref 0.0–0.1)
Basophils Relative: 1 %
Eosinophils Absolute: 0.3 10*3/uL (ref 0.0–0.5)
Eosinophils Relative: 6 %
HCT: 41.7 % (ref 36.0–46.0)
Hemoglobin: 13.8 g/dL (ref 12.0–15.0)
Immature Granulocytes: 0 %
Lymphocytes Relative: 41 %
Lymphs Abs: 2.1 10*3/uL (ref 0.7–4.0)
MCH: 28.7 pg (ref 26.0–34.0)
MCHC: 33.1 g/dL (ref 30.0–36.0)
MCV: 86.7 fL (ref 80.0–100.0)
Monocytes Absolute: 0.3 10*3/uL (ref 0.1–1.0)
Monocytes Relative: 6 %
Neutro Abs: 2.4 10*3/uL (ref 1.7–7.7)
Neutrophils Relative %: 46 %
Platelets: 419 10*3/uL — ABNORMAL HIGH (ref 150–400)
RBC: 4.81 MIL/uL (ref 3.87–5.11)
RDW: 13.6 % (ref 11.5–15.5)
WBC: 5.1 10*3/uL (ref 4.0–10.5)
nRBC: 0 % (ref 0.0–0.2)

## 2022-10-14 LAB — IRON AND TIBC
Iron: 81 ug/dL (ref 28–170)
Saturation Ratios: 25 % (ref 10.4–31.8)
TIBC: 326 ug/dL (ref 250–450)
UIBC: 245 ug/dL

## 2022-10-14 LAB — FERRITIN: Ferritin: 94 ng/mL (ref 11–307)

## 2022-10-17 ENCOUNTER — Inpatient Hospital Stay: Payer: BC Managed Care – PPO | Admitting: Internal Medicine

## 2022-10-17 ENCOUNTER — Encounter: Payer: Self-pay | Admitting: Internal Medicine

## 2022-10-17 VITALS — BP 136/81 | HR 81 | Temp 97.2°F | Resp 18 | Wt 209.6 lb

## 2022-10-17 DIAGNOSIS — D472 Monoclonal gammopathy: Secondary | ICD-10-CM

## 2022-10-17 LAB — KAPPA/LAMBDA LIGHT CHAINS
Kappa free light chain: 21.5 mg/L — ABNORMAL HIGH (ref 3.3–19.4)
Kappa, lambda light chain ratio: 1.57 (ref 0.26–1.65)
Lambda free light chains: 13.7 mg/L (ref 5.7–26.3)

## 2022-10-17 NOTE — Assessment & Plan Note (Addendum)
Asymptomatic. Patient's M protein in March 2022 0.4g/dL normal kappa Lambda light chain ratio. CBC CMP today are normal. NOV 2023- 0.4 gm/dl; today Myeloma panel/ lambda light chain monoclonal protein pending.  #I again had a long conversation with the patient regarding the pathophysiology of MGUS/small risk of turning into multiple myeloma about 1 %/year.   #Mild hypokalemia potassium 3.3-likely secondary to hydrochlorothiazide.  Dietary intervention discussed.  # Thrombocytosis: 450-460- MPN work up NEGATIVE; no bone marrow/ ?  Reactive.  ? Underlying deficiency-status post iron infusion poorly tolerated..  Platelets 419.  See below monitor for now.  # Iron def- ? NOV 2023- ferritin-10; iron sat- colonoscopy- 2021-[facility] [Dr.Sakai]; discussed re: EGD; monitor for now. Recommend gentle iron 1 pill a day less likely to cause constipation.    # DISPOSITION: # follow up in 6 months- MD; 2 week prior- labs- cbc/cmp;MM panel; k/l light chains; iron studies/ferritin-- Dr.B

## 2022-10-17 NOTE — Progress Notes (Signed)
Patient denies new problems/concerns today.   

## 2022-10-17 NOTE — Progress Notes (Signed)
[facility] OFFICE PROGRESS NOTE  Patient Care Team: Einar Pheasant, MD as PCP - General (Internal Medicine) Lequita Asal, MD (Inactive) as Referring Physician (Hematology and Oncology) Efrain Sella, MD as Consulting Physician (Gastroenterology) Rodney Booze as Physician Assistant   Cancer Staging  No matching staging information was found for the patient.  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 Serum LDH, Cr, CBC, Flow Cytometry, Skeletal survey all unremarkable.   # RML lung nodule 79m [June 2018; incidental]  Oncology History   No history exists.     INTERVAL HISTORY:  Kathleen Gould 57y.o.  female pleasant patient above history of MGUS is here for follow-up.   In the interim patient was treated with IV Venofer for relative iron deficiency.  Noted to have reaction. Hence discontinued.   Patient denies any tingling and numbness. Denies any weight loss or unusual bone pain. No nausea no vomiting no weight loss.  Patient denies any unusual shortness of breath or cough.  Review of Systems  Constitutional:  Negative for chills, diaphoresis, fever, malaise/fatigue and weight loss.  HENT:  Negative for nosebleeds and sore throat.   Eyes:  Negative for double vision.  Respiratory:  Negative for cough, hemoptysis, sputum production, shortness of breath and wheezing.   Cardiovascular:  Negative for chest pain, palpitations, orthopnea and leg swelling.  Gastrointestinal:  Negative for abdominal pain, blood in stool, constipation, diarrhea, heartburn, melena, nausea and vomiting.  Genitourinary:  Negative for dysuria, frequency and urgency.  Musculoskeletal:  Positive for joint pain. Negative for back pain.  Skin: Negative.  Negative for itching and rash.  Neurological:  Negative for dizziness, tingling, focal weakness,  weakness and headaches.  Endo/Heme/Allergies:  Does not bruise/bleed easily.  Psychiatric/Behavioral:  Negative for depression. The patient is not nervous/anxious and does not have insomnia.       PAST MEDICAL HISTORY :  Past Medical History:  Diagnosis Date   Arthritis    Endometriosis    Environmental allergies    GERD (gastroesophageal reflux disease)    Hypercholesterolemia    Hypertension    Uterine fibroid     PAST SURGICAL HISTORY :   Past Surgical History:  Procedure Laterality Date   ABDOMINAL HYSTERECTOMY     CESAREAN SECTION     x2   OOPHORECTOMY     S/P left secondary to cyst   THYROGLOSSAL DUCT CYST N/A 05/27/2015   Procedure: THYROGLOSSAL DUCT CYST EXCISION ;  Surgeon: PClyde Canterbury MD;  Location: [facility] ORS;  Service: ENT;  Laterality: N/A;    FAMILY HISTORY :   Family History  Problem Relation Age of Onset   Heart attack Father 539  Heart disease Father        bypass surgery   Hypertension Father    Hypercholesterolemia Father    Kidney disease Father    Diabetes Mother    Diabetes Other        aunt   Breast cancer Neg Hx     SOCIAL HISTORY:   Social History   Tobacco Use   Smoking status: Never   Smokeless tobacco: Never  Vaping Use   Vaping Use: Never used  Substance Use Topics   Alcohol use: No    Alcohol/week: 0.0 standard drinks of alcohol   Drug use: No    ALLERGIES:  is allergic to cefdinir, levofloxacin, and simvastatin.  MEDICATIONS:  Current Outpatient Medications  Medication Sig Dispense Refill   atorvastatin (LIPITOR) 20 MG tablet TAKE 1 TABLET BY MOUTH EVERY DAY 90 tablet 3   felodipine (PLENDIL) 5 MG 24 hr tablet TAKE 1 TABLET BY MOUTH EVERY DAY 90 tablet 1   fluticasone (FLONASE) 50 MCG/ACT nasal spray SPRAY 2 SPRAYS INTO EACH NOSTRIL EVERY DAY 48 mL 0   pantoprazole (PROTONIX) 40 MG tablet TAKE 1 TABLET BY MOUTH EVERY DAY 90 tablet 1   triamterene-hydrochlorothiazide (MAXZIDE-25) 37.5-25 MG tablet Take 0.5 tablets by  mouth daily. 30 tablet 5   No current facility-administered medications for this visit.    PHYSICAL EXAMINATION: ECOG PERFORMANCE STATUS: 0 - Asymptomatic  BP 136/81 (BP Location: Right Arm, Patient Position: Sitting)   Pulse 81   Temp (!) 97.2 F (36.2 C) (Tympanic)   Resp 18   Wt 209 lb 9.6 oz (95.1 kg)   LMP 11/06/2000   BMI 34.88 kg/m   Filed Weights   10/17/22 1500  Weight: 209 lb 9.6 oz (95.1 kg)    Physical Exam Vitals and nursing note reviewed.  HENT:     Head: Normocephalic and atraumatic.     Mouth/Throat:     Pharynx: Oropharynx is clear.  Eyes:     Extraocular Movements: Extraocular movements intact.     Pupils: Pupils are equal, round, and reactive to light.  Cardiovascular:     Rate and Rhythm: Normal rate and regular rhythm.  Pulmonary:     Comments: Decreased breath sounds bilaterally.  Abdominal:     Palpations: Abdomen is soft.  Musculoskeletal:        General: Normal range of motion.     Cervical back: Normal range of motion.  Skin:    General: Skin is warm.  Neurological:     General: No focal deficit present.     Mental Status: She is alert and oriented to person, place, and time.  Psychiatric:        Behavior: Behavior normal.        Judgment: Judgment normal.      LABORATORY DATA:  I have reviewed the data as listed    Component Value Date/Time   NA 139 10/14/2022 1528   K 3.3 (L) 10/14/2022 1528   K 3.9 05/27/2015 0615   CL 103 10/14/2022 1528   CO2 26 10/14/2022 1528   GLUCOSE 111 (H) 10/14/2022 1528   BUN 11 10/14/2022 1528   CREATININE 0.72 10/14/2022 1528   CREATININE 0.81 04/21/2014 1629   CALCIUM 9.5 10/14/2022 1528   CALCIUM 9.3 04/21/2014 1629   PROT 7.6 10/14/2022 1528   ALBUMIN 4.2 10/14/2022 1528   AST 17 10/14/2022 1528   ALT 16 10/14/2022 1528   ALKPHOS 65 10/14/2022 1528   BILITOT 0.9 10/14/2022 1528   GFRNONAA >60 10/14/2022 1528   GFRNONAA >60 04/21/2014 1629   GFRAA >60 01/08/2020 1547   GFRAA >60  04/21/2014 1629    No results found for: SPEP, UPEP  Lab Results  Component Value Date   WBC 5.1 10/14/2022   NEUTROABS 2.4 10/14/2022   HGB 13.8 10/14/2022   HCT 41.7 10/14/2022   MCV 86.7 10/14/2022   PLT 419 (H) 10/14/2022      Chemistry      Component Value Date/Time   NA 139 10/14/2022 1528   K 3.3 (L) 10/14/2022 1528   K 3.9 05/27/2015 0615   CL 103 10/14/2022 1528   CO2 26 10/14/2022 1528   BUN 11  10/14/2022 1528   CREATININE 0.72 10/14/2022 1528   CREATININE 0.81 04/21/2014 1629      Component Value Date/Time   CALCIUM 9.5 10/14/2022 1528   CALCIUM 9.3 04/21/2014 1629   ALKPHOS 65 10/14/2022 1528   AST 17 10/14/2022 1528   ALT 16 10/14/2022 1528   BILITOT 0.9 10/14/2022 1528      RADIOGRAPHIC STUDIES: I have personally reviewed the radiological images as listed and agreed with the findings in the report. No results found.   ASSESSMENT & PLAN:  MGUS (monoclonal gammopathy of unknown significance) Asymptomatic. Patient's M protein in March 2022 0.4g/dL normal kappa Lambda light chain ratio. CBC CMP today are normal. NOV 2023- 0.4 gm/dl; today Myeloma panel/ lambda light chain monoclonal protein pending.  #I again had a long conversation with the patient regarding the pathophysiology of MGUS/small risk of turning into multiple myeloma about 1 %/year.   #Mild hypokalemia potassium 3.3-likely secondary to hydrochlorothiazide.  Dietary intervention discussed.  # Thrombocytosis: 450-460- MPN work up NEGATIVE; no bone marrow/ ?  Reactive.  ? Underlying deficiency-status post iron infusion poorly tolerated.  Monitor for now.  # Iron def- ? NOV 2023- ferritin-10; iron sat- colonoscopy- 2021-[facility] [Dr.Sakai]; discussed re: EGD; monitor for now. Recommend gentle iron 1 pill a day less likely to cause constipation.    # DISPOSITION: # follow up in 6 months- MD; 2 week prior- labs- cbc/cmp;MM panel; k/l light chains; iron studies/ferritin-- Dr.B   No orders of the  defined types were placed in this encounter.  All questions were answered. The patient knows to call the clinic with any problems, questions or concerns.      Cammie Sickle, MD 10/17/2022 3:39 PM

## 2022-10-17 NOTE — Patient Instructions (Signed)
#  Recommend gentle iron 1 pill a day; should not upset your stomach or cause constipation.  Talk to the pharmacist if you can find it/it is over-the-counter.  

## 2022-10-19 LAB — PROTEIN ELECTROPHORESIS, SERUM
A/G Ratio: 1.2 (ref 0.7–1.7)
Albumin ELP: 3.8 g/dL (ref 2.9–4.4)
Alpha-1-Globulin: 0.2 g/dL (ref 0.0–0.4)
Alpha-2-Globulin: 0.6 g/dL (ref 0.4–1.0)
Beta Globulin: 1.1 g/dL (ref 0.7–1.3)
Gamma Globulin: 1.3 g/dL (ref 0.4–1.8)
Globulin, Total: 3.3 g/dL (ref 2.2–3.9)
M-Spike, %: 0.3 g/dL — ABNORMAL HIGH
Total Protein ELP: 7.1 g/dL (ref 6.0–8.5)

## 2022-10-27 ENCOUNTER — Ambulatory Visit: Payer: BC Managed Care – PPO | Admitting: Internal Medicine

## 2022-10-27 ENCOUNTER — Encounter: Payer: Self-pay | Admitting: Internal Medicine

## 2022-10-27 VITALS — BP 108/70 | HR 79 | Temp 97.8°F | Resp 16 | Ht 66.0 in | Wt 202.8 lb

## 2022-10-27 DIAGNOSIS — K219 Gastro-esophageal reflux disease without esophagitis: Secondary | ICD-10-CM

## 2022-10-27 DIAGNOSIS — Z1231 Encounter for screening mammogram for malignant neoplasm of breast: Secondary | ICD-10-CM

## 2022-10-27 DIAGNOSIS — D75839 Thrombocytosis, unspecified: Secondary | ICD-10-CM

## 2022-10-27 DIAGNOSIS — I10 Essential (primary) hypertension: Secondary | ICD-10-CM

## 2022-10-27 DIAGNOSIS — D472 Monoclonal gammopathy: Secondary | ICD-10-CM

## 2022-10-27 DIAGNOSIS — E7800 Pure hypercholesterolemia, unspecified: Secondary | ICD-10-CM | POA: Diagnosis not present

## 2022-10-27 DIAGNOSIS — R221 Localized swelling, mass and lump, neck: Secondary | ICD-10-CM

## 2022-10-27 DIAGNOSIS — D473 Essential (hemorrhagic) thrombocythemia: Secondary | ICD-10-CM

## 2022-10-27 DIAGNOSIS — F439 Reaction to severe stress, unspecified: Secondary | ICD-10-CM

## 2022-10-27 DIAGNOSIS — R911 Solitary pulmonary nodule: Secondary | ICD-10-CM

## 2022-10-27 DIAGNOSIS — D649 Anemia, unspecified: Secondary | ICD-10-CM

## 2022-10-27 MED ORDER — ATORVASTATIN CALCIUM 20 MG PO TABS: 20.0000 mg | ORAL_TABLET | Freq: Every day | ORAL | 3 refills | Status: DC

## 2022-10-27 MED ORDER — FELODIPINE ER 5 MG PO TB24: 5.0000 mg | ORAL_TABLET | Freq: Every day | ORAL | 1 refills | Status: DC

## 2022-10-27 MED ORDER — TRIAMTERENE-HCTZ 37.5-25 MG PO TABS: 0.5000 | ORAL_TABLET | Freq: Every day | ORAL | 5 refills | Status: DC

## 2022-10-27 MED ORDER — PANTOPRAZOLE SODIUM 40 MG PO TBEC: 40.0000 mg | DELAYED_RELEASE_TABLET | Freq: Every day | ORAL | 1 refills | Status: DC

## 2022-10-27 NOTE — Progress Notes (Signed)
Subjective:    Patient ID: Kathleen Gould, female    DOB: Mar 10, 1966, 57 y.o.   MRN: [id number]  Patient here for  Chief Complaint  Patient presents with   Medical Management of Chronic Issues    HPI Here to follow up regarding hypercholesterolemia, hypertension and increased stress.  Increased stress with school - work.  Discussed.  Does not feel she needs any further intervention at this time.  Tries to stay active.  No chest pain or sob reported.  No abdominal pain.  No bowel change reported.  Some nocturia - 2x.  Feels rested when wakes up.  Seeing hematology - f/u anemia.     Past Medical History:  Diagnosis Date   Arthritis    Endometriosis    Environmental allergies    GERD (gastroesophageal reflux disease)    Hypercholesterolemia    Hypertension    Uterine fibroid    Past Surgical History:  Procedure Laterality Date   ABDOMINAL HYSTERECTOMY     CESAREAN SECTION     x2   OOPHORECTOMY     S/P left secondary to cyst   THYROGLOSSAL DUCT CYST N/A 05/27/2015   Procedure: THYROGLOSSAL DUCT CYST EXCISION ;  Surgeon: Clyde Canterbury, MD;  Location: [facility] ORS;  Service: ENT;  Laterality: N/A;   Family History  Problem Relation Age of Onset   Heart attack Father 54   Heart disease Father        bypass surgery   Hypertension Father    Hypercholesterolemia Father    Kidney disease Father    Diabetes Mother    Diabetes Other        aunt   Breast cancer Neg Hx    Social History   Socioeconomic History   Marital status: Married    Spouse name: Not on file   Number of children: 2   Years of education: Not on file   Highest education level: Not on file  Occupational History   Not on file  Tobacco Use   Smoking status: Never   Smokeless tobacco: Never  Vaping Use   Vaping Use: Never used  Substance and Sexual Activity   Alcohol use: No    Alcohol/week: 0.0 standard drinks of alcohol   Drug use: No   Sexual activity: Not Currently  Other Topics Concern   Not  on file  Social History Narrative   Widowed    Social Determinants of Health   Financial Resource Strain: Not on file  Food Insecurity: Not on file  Transportation Needs: Not on file  Physical Activity: Not on file  Stress: Not on file  Social Connections: Not on file     Review of Systems  Constitutional:  Negative for appetite change and unexpected weight change.  HENT:  Negative for congestion and sinus pressure.   Respiratory:  Negative for cough, chest tightness and shortness of breath.   Cardiovascular:  Negative for chest pain and palpitations.  Gastrointestinal:  Negative for abdominal pain, diarrhea, nausea and vomiting.  Genitourinary:  Negative for difficulty urinating and dysuria.  Musculoskeletal:  Negative for joint swelling and myalgias.  Skin:  Negative for color change and rash.  Neurological:  Negative for dizziness and headaches.  Psychiatric/Behavioral:  Negative for agitation and dysphoric mood.        Objective:     BP 108/70   Pulse 79   Temp 97.8 F (36.6 C)   Resp 16   Ht '5\' 6"'$  (1.676 m)   Wt  202 lb 12.8 oz (92 kg)   LMP 11/06/2000   SpO2 98%   BMI 32.73 kg/m  Wt Readings from Last 3 Encounters:  10/27/22 202 lb 12.8 oz (92 kg)  10/17/22 209 lb 9.6 oz (95.1 kg)  07/15/22 201 lb 9.6 oz (91.4 kg)    Physical Exam Vitals reviewed.  Constitutional:      General: She is not in acute distress.    Appearance: Normal appearance.  HENT:     Head: Normocephalic and atraumatic.     Right Ear: External ear normal.     Left Ear: External ear normal.  Eyes:     General: No scleral icterus.       Right eye: No discharge.        Left eye: No discharge.     Conjunctiva/sclera: Conjunctivae normal.  Neck:     Thyroid: No thyromegaly.  Cardiovascular:     Rate and Rhythm: Normal rate and regular rhythm.  Pulmonary:     Effort: No respiratory distress.     Breath sounds: Normal breath sounds. No wheezing.  Abdominal:     General: Bowel  sounds are normal.     Palpations: Abdomen is soft.     Tenderness: There is no abdominal tenderness.  Musculoskeletal:        General: No swelling or tenderness.     Cervical back: Neck supple. No tenderness.  Lymphadenopathy:     Cervical: No cervical adenopathy.  Skin:    Findings: No erythema or rash.  Neurological:     Mental Status: She is alert.  Psychiatric:        Mood and Affect: Mood normal.        Behavior: Behavior normal.      Outpatient Encounter Medications as of 10/27/2022  Medication Sig   atorvastatin (LIPITOR) 20 MG tablet Take 1 tablet (20 mg total) by mouth daily.   felodipine (PLENDIL) 5 MG 24 hr tablet Take 1 tablet (5 mg total) by mouth daily.   fluticasone (FLONASE) 50 MCG/ACT nasal spray SPRAY 2 SPRAYS INTO EACH NOSTRIL EVERY DAY   pantoprazole (PROTONIX) 40 MG tablet Take 1 tablet (40 mg total) by mouth daily.   triamterene-hydrochlorothiazide (MAXZIDE-25) 37.5-25 MG tablet Take 0.5 tablets by mouth daily.   [DISCONTINUED] atorvastatin (LIPITOR) 20 MG tablet TAKE 1 TABLET BY MOUTH EVERY DAY   [DISCONTINUED] felodipine (PLENDIL) 5 MG 24 hr tablet TAKE 1 TABLET BY MOUTH EVERY DAY   [DISCONTINUED] pantoprazole (PROTONIX) 40 MG tablet TAKE 1 TABLET BY MOUTH EVERY DAY   [DISCONTINUED] triamterene-hydrochlorothiazide (MAXZIDE-25) 37.5-25 MG tablet Take 0.5 tablets by mouth daily.   No facility-administered encounter medications on file as of 10/27/2022.     Lab Results  Component Value Date   WBC 5.1 10/14/2022   HGB 13.8 10/14/2022   HCT 41.7 10/14/2022   PLT 419 (H) 10/14/2022   GLUCOSE 84 10/27/2022   CHOL 217 (H) 10/27/2022   TRIG 76.0 10/27/2022   HDL 80.90 10/27/2022   LDLDIRECT 164.5 07/24/2013   LDLCALC 121 (H) 10/27/2022   ALT 16 10/14/2022   AST 17 10/14/2022   NA 140 10/27/2022   K 3.8 10/27/2022   CL 100 10/27/2022   CREATININE 0.73 10/27/2022   BUN 10 10/27/2022   CO2 31 10/27/2022   TSH 1.12 10/27/2022   INR 1.0 04/21/2014     MM 3D SCREEN BREAST BILATERAL  Result Date: 02/16/2022 CLINICAL DATA:  Screening. EXAM: DIGITAL SCREENING BILATERAL MAMMOGRAM WITH TOMOSYNTHESIS AND CAD  TECHNIQUE: Bilateral screening digital craniocaudal and mediolateral oblique mammograms were obtained. Bilateral screening digital breast tomosynthesis was performed. The images were evaluated with computer-aided detection. COMPARISON:  Previous exam(s). ACR Breast Density Category b: There are scattered areas of fibroglandular density. FINDINGS: There are no findings suspicious for malignancy. IMPRESSION: No mammographic evidence of malignancy. A result letter of this screening mammogram will be mailed directly to the patient. RECOMMENDATION: Screening mammogram in one year. (Code:SM-B-01Y) BI-RADS CATEGORY  1: Negative. Electronically Signed   By: Lillia Mountain M.D.   On: 02/16/2022 11:23       Assessment & Plan:  Pure hypercholesterolemia Assessment & Plan: On lipitor.  Low cholesterol diet and exercise.  Follow lipid panel and liver function tests.    Orders: -     Lipid panel  Gastroesophageal reflux disease without esophagitis Assessment & Plan: On protonix.  No upper symptoms reported.    Orders: -     Pantoprazole Sodium; Take 1 tablet (40 mg total) by mouth daily.  Dispense: 90 tablet; Refill: 1  Essential hypertension, benign Assessment & Plan: Continue plendil and triam/hctz.  Blood pressure doing well.  Follow pressures.  Follow metabolic panel.   Orders: -     TSH -     Basic metabolic panel  Visit for screening mammogram -     3D Screening Mammogram, Left and Right; Future  Anemia, unspecified type Assessment & Plan: Followed by hematology. Iron supplementation.     Lung nodule, solitary Assessment & Plan: Followed by hematology.  No further evaluation warranted.  See note.    MGUS (monoclonal gammopathy of unknown significance) Assessment & Plan: Followed by hematology.  Stable.     Neck  nodule Assessment & Plan: Followed by ENT.  Just evaluated.  Recommended continued monitoring - one year.     Stress Assessment & Plan: Overall appears to be handling things relatively well.  Follow.     Thrombocytosis Assessment & Plan: Being followed by hematology.  Felt to be reactive.    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  Felt to be reactive.    Other orders -     Atorvastatin Calcium; Take 1 tablet (20 mg total) by mouth daily.  Dispense: 90 tablet; Refill: 3 -     Felodipine ER; Take 1 tablet (5 mg total) by mouth daily.  Dispense: 90 tablet; Refill: 1 -     Triamterene-HCTZ; Take 0.5 tablets by mouth daily.  Dispense: 30 tablet; Refill: 5     Einar Pheasant, MD

## 2022-10-28 LAB — BASIC METABOLIC PANEL
BUN: 10 mg/dL (ref 6–23)
CO2: 31 mEq/L (ref 19–32)
Calcium: 10.5 mg/dL (ref 8.4–10.5)
Chloride: 100 mEq/L (ref 96–112)
Creatinine, Ser: 0.73 mg/dL (ref 0.40–1.20)
GFR: 90.64 mL/min (ref >60.00)
Glucose, Bld: 84 mg/dL (ref 70–99)
Potassium: 3.8 mEq/L (ref 3.5–5.1)
Sodium: 140 mEq/L (ref 135–145)

## 2022-10-28 LAB — LIPID PANEL
Cholesterol: 217 mg/dL — ABNORMAL HIGH (ref 0–200)
HDL: 80.9 mg/dL (ref >39.00)
LDL Cholesterol: 121 mg/dL — ABNORMAL HIGH (ref 0–99)
NonHDL: 135.78
Total CHOL/HDL Ratio: 3
Triglycerides: 76 mg/dL (ref 0.0–149.0)
VLDL: 15.2 mg/dL (ref 0.0–40.0)

## 2022-10-28 LAB — TSH: TSH: 1.12 u[IU]/mL (ref 0.35–5.50)

## 2022-10-30 ENCOUNTER — Encounter: Payer: Self-pay | Admitting: Internal Medicine

## 2022-10-30 NOTE — Assessment & Plan Note (Signed)
Continue plendil and triam/hctz.  Blood pressure doing well.  Follow pressures.  Follow metabolic panel.

## 2022-10-30 NOTE — Assessment & Plan Note (Signed)
Overall appears to be handling things relatively well.  Follow.   

## 2022-10-30 NOTE — Assessment & Plan Note (Signed)
On protonix.  No upper symptoms reported.   

## 2022-10-30 NOTE — Assessment & Plan Note (Addendum)
Followed by hematology. Iron supplementation.

## 2022-10-30 NOTE — Assessment & Plan Note (Signed)
Followed by hematology.  No further evaluation warranted.  See note.

## 2022-10-30 NOTE — Assessment & Plan Note (Signed)
On lipitor.  Low cholesterol diet and exercise.  Follow lipid panel and liver function tests.   

## 2022-10-30 NOTE — Assessment & Plan Note (Signed)
Followed by hematology.  Stable.

## 2022-10-30 NOTE — Assessment & Plan Note (Signed)
Being followed by hematology.  Felt to be reactive.

## 2022-10-30 NOTE — Assessment & Plan Note (Signed)
Followed by ENT.  Just evaluated.  Recommended continued monitoring - one year.

## 2022-11-07 ENCOUNTER — Telehealth: Payer: Self-pay

## 2022-11-07 NOTE — Telephone Encounter (Signed)
LM FOR PT TO CB RE ::   I reviewed your recent lab results and your cholesterol has improved.  Good job! Your thyroid test and kidney function tests are within normal limits.

## 2022-11-07 NOTE — Telephone Encounter (Signed)
Pt called back and I read the message to her and she verbalized understanding 

## 2022-11-08 NOTE — Telephone Encounter (Signed)
Noted  

## 2023-01-05 ENCOUNTER — Ambulatory Visit: Payer: BC Managed Care – PPO | Admitting: Internal Medicine

## 2023-01-05 ENCOUNTER — Encounter: Payer: Self-pay | Admitting: Internal Medicine

## 2023-01-05 VITALS — BP 112/72 | HR 79 | Temp 98.0°F | Resp 16 | Ht 65.5 in | Wt 205.0 lb

## 2023-01-05 DIAGNOSIS — K219 Gastro-esophageal reflux disease without esophagitis: Secondary | ICD-10-CM | POA: Diagnosis not present

## 2023-01-05 DIAGNOSIS — F439 Reaction to severe stress, unspecified: Secondary | ICD-10-CM

## 2023-01-05 DIAGNOSIS — D473 Essential (hemorrhagic) thrombocythemia: Secondary | ICD-10-CM | POA: Diagnosis not present

## 2023-01-05 DIAGNOSIS — D649 Anemia, unspecified: Secondary | ICD-10-CM

## 2023-01-05 DIAGNOSIS — E7800 Pure hypercholesterolemia, unspecified: Secondary | ICD-10-CM

## 2023-01-05 DIAGNOSIS — L80 Vitiligo: Secondary | ICD-10-CM

## 2023-01-05 DIAGNOSIS — D472 Monoclonal gammopathy: Secondary | ICD-10-CM

## 2023-01-05 DIAGNOSIS — R059 Cough, unspecified: Secondary | ICD-10-CM

## 2023-01-05 DIAGNOSIS — R911 Solitary pulmonary nodule: Secondary | ICD-10-CM

## 2023-01-05 DIAGNOSIS — I10 Essential (primary) hypertension: Secondary | ICD-10-CM

## 2023-01-05 MED ORDER — ALBUTEROL SULFATE HFA 108 (90 BASE) MCG/ACT IN AERS: 2.0000 | INHALATION_SPRAY | RESPIRATORY_TRACT | 1 refills | Status: AC | PRN

## 2023-01-05 MED ORDER — PANTOPRAZOLE SODIUM 40 MG PO TBEC: 40.0000 mg | DELAYED_RELEASE_TABLET | Freq: Every day | ORAL | 1 refills | Status: DC

## 2023-01-05 MED ORDER — FELODIPINE ER 5 MG PO TB24: 5.0000 mg | ORAL_TABLET | Freq: Every day | ORAL | 1 refills | Status: DC

## 2023-01-05 NOTE — Progress Notes (Signed)
Subjective:    Patient ID: Kathleen Gould, female    DOB: 1965/11/17, 57 y.o.   MRN: [id number]  Patient here for  Chief Complaint  Patient presents with   Cough   Rash    HPI Here for a work in appt - cough and rash.  Was seen 11/23/22 - diagnosed with bronchitis/sinusitis.  Treated with doxycycline, prednisone, albuterol inhaler and tessalon perles.  She is feeling better.  No congestion.  No drainage.  No chest pain or tightness.  Persistent intermittent cough.  Feels is "triggered".  No definite trigger. May be worse when at school. States the heating and air unit was just replaced at school.  Still using humidifiers prn at work.   Using rescue inhaler qod. Has been using every other day - trying to make it last.  No nausea or vomiting.  No abdominal pain.  Noticed skin color change - wrist.  Area getting larger.    Past Medical History:  Diagnosis Date   Arthritis    Endometriosis    Environmental allergies    GERD (gastroesophageal reflux disease)    Hypercholesterolemia    Hypertension    Uterine fibroid    Past Surgical History:  Procedure Laterality Date   ABDOMINAL HYSTERECTOMY     CESAREAN SECTION     x2   OOPHORECTOMY     S/P left secondary to cyst   THYROGLOSSAL DUCT CYST N/A 05/27/2015   Procedure: THYROGLOSSAL DUCT CYST EXCISION ;  Surgeon: Geanie Logan, MD;  Location: [facility] OR[facility];  Service: ENT;  Laterality: N/A;   Family History  Problem Relation Age of Onset   Heart attack Father 44   Heart disease Father        bypass surgery   Hypertension Father    Hypercholesterolemia Father    Kidney disease Father    Diabetes Mother    Diabetes Other        aunt   Breast cancer Neg Hx    Social History   Socioeconomic History   Marital status: Married    Spouse name: Not on file   Number of children: 2   Years of education: Not on file   Highest education level: Not on file  Occupational History   Not on file  Tobacco Use   Smoking status: Never    Smokeless tobacco: Never  Vaping Use   Vaping Use: Never used  Substance and Sexual Activity   Alcohol use: No    Alcohol/week: 0.0 standard drinks of alcohol   Drug use: No   Sexual activity: Not Currently  Other Topics Concern   Not on file  Social History Narrative   Widowed    Social Determinants of Health   Financial Resource Strain: Not on file  Food Insecurity: Not on file  Transportation Needs: Not on file  Physical Activity: Not on file  Stress: Not on file  Social Connections: Not on file     Review of Systems  Constitutional:  Negative for appetite change and unexpected weight change.  HENT:  Negative for congestion and sinus pressure.   Respiratory:  Positive for cough. Negative for chest tightness and shortness of breath.   Cardiovascular:  Negative for chest pain, palpitations and leg swelling.  Gastrointestinal:  Negative for abdominal pain, diarrhea, nausea and vomiting.  Genitourinary:  Negative for difficulty urinating and dysuria.  Musculoskeletal:  Negative for joint swelling and myalgias.  Skin:  Negative for color change.  Decrease pigment - wrist.  Area getting larger.   Neurological:  Negative for dizziness, light-headedness and headaches.  Psychiatric/Behavioral:  Negative for agitation and dysphoric mood.        Objective:     BP 112/72   Pulse 79   Temp 98 F (36.7 C)   Resp 16   Ht 5' 5.5" (1.664 m)   Wt 205 lb (93 kg)   LMP 11/06/2000   SpO2 98%   BMI 33.59 kg/m  Wt Readings from Last 3 Encounters:  01/05/23 205 lb (93 kg)  10/27/22 202 lb 12.8 oz (92 kg)  10/17/22 209 lb 9.6 oz (95.1 kg)    Physical Exam Vitals reviewed.  Constitutional:      General: She is not in acute distress.    Appearance: Normal appearance.  HENT:     Head: Normocephalic and atraumatic.     Right Ear: External ear normal.     Left Ear: External ear normal.  Eyes:     General: No scleral icterus.       Right eye: No discharge.        Left  eye: No discharge.     Conjunctiva/sclera: Conjunctivae normal.  Neck:     Thyroid: No thyromegaly.  Cardiovascular:     Rate and Rhythm: Normal rate and regular rhythm.  Pulmonary:     Effort: No respiratory distress.     Breath sounds: Normal breath sounds. No wheezing.  Abdominal:     General: Bowel sounds are normal.     Palpations: Abdomen is soft.     Tenderness: There is no abdominal tenderness.  Musculoskeletal:        General: No swelling or tenderness.     Cervical back: Neck supple. No tenderness.  Lymphadenopathy:     Cervical: No cervical adenopathy.  Skin:    Findings: No erythema.     Comments: Pigment change - right wrist.  Non tender. No increased erythema   Neurological:     Mental Status: She is alert.  Psychiatric:        Mood and Affect: Mood normal.        Behavior: Behavior normal.     Outpatient Encounter Medications as of 01/05/2023  Medication Sig   [DISCONTINUED] albuterol (VENTOLIN HFA) 108 (90 Base) MCG/ACT inhaler Inhale 2 puffs into the lungs every 4 (four) hours as needed.   albuterol (VENTOLIN HFA) 108 (90 Base) MCG/ACT inhaler Inhale 2 puffs into the lungs every 4 (four) hours as needed.   atorvastatin (LIPITOR) 20 MG tablet Take 1 tablet (20 mg total) by mouth daily.   felodipine (PLENDIL) 5 MG 24 hr tablet Take 1 tablet (5 mg total) by mouth daily.   fluticasone (FLONASE) 50 MCG/ACT nasal spray SPRAY 2 SPRAYS INTO EACH NOSTRIL EVERY DAY   pantoprazole (PROTONIX) 40 MG tablet Take 1 tablet (40 mg total) by mouth daily.   triamterene-hydrochlorothiazide (MAXZIDE-25) 37.5-25 MG tablet Take 0.5 tablets by mouth daily.   [DISCONTINUED] felodipine (PLENDIL) 5 MG 24 hr tablet Take 1 tablet (5 mg total) by mouth daily.   [DISCONTINUED] pantoprazole (PROTONIX) 40 MG tablet Take 1 tablet (40 mg total) by mouth daily.   No facility-administered encounter medications on file as of 01/05/2023.     Lab Results  Component Value Date   WBC 5.1 10/14/2022    HGB 13.8 10/14/2022   HCT 41.7 10/14/2022   PLT 419 (H) 10/14/2022   GLUCOSE 84 10/27/2022   CHOL 217 (H) 10/27/2022  TRIG 76.0 10/27/2022   HDL 80.90 10/27/2022   LDLDIRECT 164.5 07/24/2013   LDLCALC 121 (H) 10/27/2022   ALT 16 10/14/2022   AST 17 10/14/2022   NA 140 10/27/2022   K 3.8 10/27/2022   CL 100 10/27/2022   CREATININE 0.73 10/27/2022   BUN 10 10/27/2022   CO2 31 10/27/2022   TSH 1.12 10/27/2022   INR 1.0 04/21/2014    MM 3D SCREEN BREAST BILATERAL  Result Date: 02/16/2022 CLINICAL DATA:  Screening. EXAM: DIGITAL SCREENING BILATERAL MAMMOGRAM WITH TOMOSYNTHESIS AND CAD TECHNIQUE: Bilateral screening digital craniocaudal and mediolateral oblique mammograms were obtained. Bilateral screening digital breast tomosynthesis was performed. The images were evaluated with computer-aided detection. COMPARISON:  Previous exam(s). ACR Breast Density Category b: There are scattered areas of fibroglandular density. FINDINGS: There are no findings suspicious for malignancy. IMPRESSION: No mammographic evidence of malignancy. A result letter of this screening mammogram will be mailed directly to the patient. RECOMMENDATION: Screening mammogram in one year. (Code:SM-B-01Y) BI-RADS CATEGORY  1: Negative. Electronically Signed   By: Baird Lyons M.D.   On: 02/16/2022 11:23       Assessment & Plan:  Cough, unspecified type Assessment & Plan: Persistent cough.  No known trigger.  No increased allergy symptoms currently. Specifically denies any increased drainage.  Trial of antihistamine.  Refilled albuterol inhaler if needed.  Discussed - cxr and f/u with pulmonary for further evaluation and question of need for PFTs, etc. Denies reflux symptoms.   Orders: -     Ambulatory referral to Pulmonology  Gastroesophageal reflux disease without esophagitis Assessment & Plan: On protonix.  No upper symptoms reported.     Anemia, unspecified type Assessment & Plan: Followed by hematology.  Iron supplementation.     Essential (hemorrhagic) thrombocythemia ([facility]) Assessment & Plan: Being followed by hematology.  Felt to be reactive.    Essential hypertension, benign Assessment & Plan: Continue plendil and triam/hctz.  Blood pressure doing well.  Follow pressures.  Follow metabolic panel.    Lung nodule, solitary Assessment & Plan: Followed by hematology.  No further evaluation warranted.  See note.    MGUS (monoclonal gammopathy of unknown significance) Assessment & Plan: Followed by hematology.  Stable.     Pure hypercholesterolemia Assessment & Plan: On lipitor.  Low cholesterol diet and exercise.  Follow lipid panel and liver function tests.     Stress Assessment & Plan: Overall appears to be handling things relatively well.  Follow.     Vitiligo Assessment & Plan: Noted on wrist.  Refer to dermatology for further evaluation.   Orders: -     Ambulatory referral to Dermatology  Other orders -     Felodipine ER; Take 1 tablet (5 mg total) by mouth daily.  Dispense: 90 tablet; Refill: 1 -     Pantoprazole Sodium; Take 1 tablet (40 mg total) by mouth daily.  Dispense: 90 tablet; Refill: 1 -     Albuterol Sulfate HFA; Inhale 2 puffs into the lungs every 4 (four) hours as needed.  Dispense: 18 g; Refill: 1     Dale Evangeline, MD

## 2023-01-05 NOTE — Patient Instructions (Signed)
Take the antihistamine daily.

## 2023-01-08 ENCOUNTER — Encounter: Payer: Self-pay | Admitting: Internal Medicine

## 2023-01-08 DIAGNOSIS — L80 Vitiligo: Secondary | ICD-10-CM | POA: Insufficient documentation

## 2023-01-08 NOTE — Assessment & Plan Note (Addendum)
Persistent cough.  No known trigger.  No increased allergy symptoms currently. Specifically denies any increased drainage.  Trial of antihistamine.  Refilled albuterol inhaler if needed.  Discussed - cxr and f/u with pulmonary for further evaluation and question of need for PFTs, etc. Denies reflux symptoms.

## 2023-01-08 NOTE — Assessment & Plan Note (Signed)
Followed by hematology.  No further evaluation warranted.  See note.  

## 2023-01-08 NOTE — Assessment & Plan Note (Signed)
Overall appears to be handling things relatively well.  Follow.   

## 2023-01-08 NOTE — Assessment & Plan Note (Signed)
Continue plendil and triam/hctz.  Blood pressure doing well.  Follow pressures.  Follow metabolic panel.  

## 2023-01-08 NOTE — Assessment & Plan Note (Signed)
Followed by hematology. Iron supplementation.   

## 2023-01-08 NOTE — Assessment & Plan Note (Signed)
On protonix.  No upper symptoms reported.   

## 2023-01-08 NOTE — Assessment & Plan Note (Signed)
On lipitor.  Low cholesterol diet and exercise.  Follow lipid panel and liver function tests.   

## 2023-01-08 NOTE — Assessment & Plan Note (Signed)
Being followed by hematology.  Felt to be reactive.  

## 2023-01-08 NOTE — Assessment & Plan Note (Signed)
Followed by hematology.  Stable.  

## 2023-01-08 NOTE — Assessment & Plan Note (Signed)
Noted on wrist.  Refer to dermatology for further evaluation.

## 2023-01-13 ENCOUNTER — Telehealth: Payer: Self-pay

## 2023-01-13 NOTE — Telephone Encounter (Signed)
Patient states Dr. Dale Amherst referred her to Dr. Gwen Pounds at [facility] and the earliest appointment they have is 12/28/2023.  Patient states she would like to know if Dr. Lorin Picket can refer her to someone else who can see her sooner.

## 2023-01-16 NOTE — Telephone Encounter (Signed)
LMTCB

## 2023-01-31 NOTE — Telephone Encounter (Signed)
Spoke with patient and advised all derm offices are booked out right now. Pt not willing to drive to [place]. Will call [facility] to see if she can be seen sooner by them.

## 2023-01-31 NOTE — Telephone Encounter (Signed)
Called patient unable to leave message

## 2023-02-09 NOTE — Telephone Encounter (Signed)
Patient scheduled with Dr Gwen Pounds 7/24

## 2023-02-20 ENCOUNTER — Ambulatory Visit: Payer: BC Managed Care – PPO | Admitting: Student in an Organized Health Care Education/Training Program

## 2023-02-20 ENCOUNTER — Encounter: Payer: Self-pay | Admitting: Student in an Organized Health Care Education/Training Program

## 2023-02-20 VITALS — BP 124/72 | HR 77 | Temp 97.8°F | Ht 65.5 in | Wt 201.2 lb

## 2023-02-20 DIAGNOSIS — R059 Cough, unspecified: Secondary | ICD-10-CM | POA: Diagnosis not present

## 2023-02-20 DIAGNOSIS — R911 Solitary pulmonary nodule: Secondary | ICD-10-CM

## 2023-02-20 NOTE — Progress Notes (Signed)
Synopsis: Referred in for cough by Dale Binghamton, MD  Assessment & Plan:   #Cough, unspecified type  Presents for the evaluation of cough that seems to be subacute in nature and has drastically improved.  My differential for this includes upper airway cough syndrome, reflux associated cough, and less likely work-related asthma.  I will obtain a pulmonary function test to assess spirometry for any obstruction and DLCO for any sign of parenchymal lung disease.  - Pulmonary Function Test [facility] Only; Future  #Lung nodule  Has a history of a pulmonary nodule from 2018 that was not followed up given low risk.  Patient is low risk but given no follow-up as well as history of mold exposure, I will get a repeat CT scan of the chest.  - CT CHEST WO CONTRAST; Future    Return in about 3 months (around 05/23/2023).  I spent 45 minutes caring for this patient today, including preparing to see the patient, obtaining a medical history , reviewing a separately obtained history, performing a medically appropriate examination and/or evaluation, counseling and educating the patient/family/caregiver, ordering medications, tests, or procedures, documenting clinical information in the electronic health record, and independently interpreting results (not separately reported/billed) and communicating results to the patient/family/caregiver  Raechel Chute, MD [facility] 02/20/2023 9:16 AM    End of visit medications:  No orders of the defined types were placed in this encounter.    Current Outpatient Medications:    albuterol (VENTOLIN HFA) 108 (90 Base) MCG/ACT inhaler, Inhale 2 puffs into the lungs every 4 (four) hours as needed., Disp: 18 g, Rfl: 1   atorvastatin (LIPITOR) 20 MG tablet, Take 1 tablet (20 mg total) by mouth daily., Disp: 90 tablet, Rfl: 3   felodipine (PLENDIL) 5 MG 24 hr tablet, Take 1 tablet (5 mg total) by mouth daily., Disp: 90 tablet, Rfl: 1   fluticasone  (FLONASE) 50 MCG/ACT nasal spray, SPRAY 2 SPRAYS INTO EACH NOSTRIL EVERY DAY, Disp: 48 mL, Rfl: 0   pantoprazole (PROTONIX) 40 MG tablet, Take 1 tablet (40 mg total) by mouth daily., Disp: 90 tablet, Rfl: 1   triamterene-hydrochlorothiazide (MAXZIDE-25) 37.5-25 MG tablet, Take 0.5 tablets by mouth daily., Disp: 30 tablet, Rfl: 5   Subjective:   PATIENT ID: Kathleen Gould GENDER: female DOB: Jan 09, 1966, MRN: [id number]  Chief Complaint  Patient presents with   pulmonary consult    Dx with bronchitis 10/2022- lingering cough. Cough is dry.     HPI  Patient is a pleasant 57 year old female with a past medical history of hypertension and MGUS who presents to clinic for the evaluation of cough.  The cough started around March of this year following a possible URTI.  She does not recall or endorse any symptoms of URTI at that point but was told so at urgent care.  The cough was constant and productive. Over the course of the spring, she restarted her second-generation antihistamine (Claritin) which has worked very well and helped resolve her cough.  She also used albuterol multiple times over the course of the past few months. She now reports that the cough has drastically improved and she has not had to use her albuterol throughout June.  She also retired from the [employer] and feels that there might have been a component of mold exposure to her symptoms [facility] schools had to be remediated for mold).  She denies any fevers, chills, night sweats, weight loss, joint pains, muscle aches, sore throat,  or other symptoms of systemic illness. Patient is also inquiring about a previously noted pulmonary nodule.  Patient was previously seen in our clinic in 2019 for a similar chief complaint. She was started on antihistamines, PPI, and intranasal Atrovent with improvement in the cough.  She reports a history of seasonal allergies and felt that the pollen this year affected  her more than usual.  She also has some food allergies (feels that flowery fruits and vegetables such as strawberry, cucumber, and zucchini cause her to have a rash).  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  She is from [place] and has lived locally all her life.  Denies any international travel, denies any military experience, and otherwise denies any occupational exposures.   [employer] had mold in the past that was remediated.  She denies any history of smoking and denies any vaping use.  Ancillary information including prior medications, full medical/surgical/family/social histories, and PFTs (when available) are listed below and have been reviewed.   Review of Systems  Constitutional:  Negative for chills, diaphoresis, fever, malaise/fatigue and weight loss.  Respiratory:  Positive for cough. Negative for hemoptysis, sputum production, shortness of breath and wheezing.   Cardiovascular:  Negative for chest pain.  Skin:  Negative for rash.     Objective:   Vitals:   02/20/23 0852  BP: 124/72  Pulse: 77  Temp: 97.8 F (36.6 C)  TempSrc: Temporal  SpO2: 98%  Weight: 201 lb 3.2 oz (91.3 kg)  Height: 5' 5.5" (1.664 m)   98% on RA BMI Readings from Last 3 Encounters:  02/20/23 32.97 kg/m  01/05/23 33.59 kg/m  10/27/22 32.73 kg/m   Wt Readings from Last 3 Encounters:  02/20/23 201 lb 3.2 oz (91.3 kg)  01/05/23 205 lb (93 kg)  10/27/22 202 lb 12.8 oz (92 kg)    Physical Exam Constitutional:      Appearance: Normal appearance. She is not ill-appearing.  HENT:     Mouth/Throat:     Mouth: Mucous membranes are moist.  Cardiovascular:     Rate and Rhythm: Normal rate and regular rhythm.     Pulses: Normal pulses.     Heart sounds: Normal heart sounds.  Pulmonary:     Effort: Pulmonary effort is normal.     Breath sounds: Normal breath sounds.  Neurological:     General: No focal deficit present.     Mental Status: She is  alert and oriented to person, place, and time. Mental status is at baseline.       Ancillary Information    Past Medical History:  Diagnosis Date   Arthritis    Endometriosis    Environmental allergies    GERD (gastroesophageal reflux disease)    Hypercholesterolemia    Hypertension    Uterine fibroid      Family History  Problem Relation Age of Onset   Heart attack Father 86   Heart disease Father        bypass surgery   Hypertension Father    Hypercholesterolemia Father    Kidney disease Father    Diabetes Mother    Diabetes Other        aunt   Breast cancer Neg Hx      Past Surgical History:  Procedure Laterality Date   ABDOMINAL HYSTERECTOMY     CESAREAN SECTION     x2   OOPHORECTOMY     S/P left secondary to cyst  THYROGLOSSAL DUCT CYST N/A 05/27/2015   Procedure: THYROGLOSSAL DUCT CYST EXCISION ;  Surgeon: Geanie Logan, MD;  Location: [facility] ORS;  Service: ENT;  Laterality: N/A;    Social History   Socioeconomic History   Marital status: Married    Spouse name: Not on file   Number of children: 2   Years of education: Not on file   Highest education level: Not on file  Occupational History   Not on file  Tobacco Use   Smoking status: Never   Smokeless tobacco: Never  Vaping Use   Vaping Use: Never used  Substance and Sexual Activity   Alcohol use: No    Alcohol/week: 0.0 standard drinks of alcohol   Drug use: No   Sexual activity: Not Currently  Other Topics Concern   Not on file  Social History Narrative   Widowed    Social Determinants of Health   Financial Resource Strain: Not on file  Food Insecurity: Not on file  Transportation Needs: Not on file  Physical Activity: Not on file  Stress: Not on file  Social Connections: Not on file  Intimate Partner Violence: Not on file     Allergies  Allergen Reactions   Cefdinir Itching   Levofloxacin Other (See Comments)    Joint pain   Simvastatin Other (See Comments)    "muscle  aches"     CBC    Component Value Date/Time   WBC 5.1 10/14/2022 1528   RBC 4.81 10/14/2022 1528   HGB 13.8 10/14/2022 1528   HGB 13.6 04/21/2014 1629   HCT 41.7 10/14/2022 1528   HCT 41.5 04/21/2014 1629   PLT 419 (H) 10/14/2022 1528   PLT 440 04/21/2014 1629   MCV 86.7 10/14/2022 1528   MCV 87 04/21/2014 1629   MCH 28.7 10/14/2022 1528   MCHC 33.1 10/14/2022 1528   RDW 13.6 10/14/2022 1528   RDW 14.4 04/21/2014 1629   LYMPHSABS 2.1 10/14/2022 1528   LYMPHSABS 1.6 04/21/2014 1629   MONOABS 0.3 10/14/2022 1528   MONOABS 0.3 04/21/2014 1629   EOSABS 0.3 10/14/2022 1528   EOSABS 0.0 04/21/2014 1629   BASOSABS 0.0 10/14/2022 1528   BASOSABS 0.0 04/21/2014 1629    Pulmonary Functions Testing Results:     No data to display          Outpatient Medications Prior to Visit  Medication Sig Dispense Refill   albuterol (VENTOLIN HFA) 108 (90 Base) MCG/ACT inhaler Inhale 2 puffs into the lungs every 4 (four) hours as needed. 18 g 1   atorvastatin (LIPITOR) 20 MG tablet Take 1 tablet (20 mg total) by mouth daily. 90 tablet 3   felodipine (PLENDIL) 5 MG 24 hr tablet Take 1 tablet (5 mg total) by mouth daily. 90 tablet 1   fluticasone (FLONASE) 50 MCG/ACT nasal spray SPRAY 2 SPRAYS INTO EACH NOSTRIL EVERY DAY 48 mL 0   pantoprazole (PROTONIX) 40 MG tablet Take 1 tablet (40 mg total) by mouth daily. 90 tablet 1   triamterene-hydrochlorothiazide (MAXZIDE-25) 37.5-25 MG tablet Take 0.5 tablets by mouth daily. 30 tablet 5   No facility-administered medications prior to visit.

## 2023-02-23 ENCOUNTER — Ambulatory Visit
Admission: RE | Admit: 2023-02-23 | Discharge: 2023-02-23 | Disposition: A | Discharge status: Home / Self Care | Payer: BC Managed Care – PPO | Source: Ambulatory Visit | Attending: Internal Medicine | Admitting: Internal Medicine

## 2023-02-23 DIAGNOSIS — Z1231 Encounter for screening mammogram for malignant neoplasm of breast: Secondary | ICD-10-CM | POA: Diagnosis present

## 2023-02-27 ENCOUNTER — Ambulatory Visit: Payer: BC Managed Care – PPO

## 2023-02-28 ENCOUNTER — Encounter: Payer: Self-pay | Admitting: Internal Medicine

## 2023-02-28 ENCOUNTER — Ambulatory Visit: Payer: BC Managed Care – PPO | Admitting: Internal Medicine

## 2023-02-28 ENCOUNTER — Telehealth: Payer: Self-pay | Admitting: Internal Medicine

## 2023-02-28 VITALS — BP 114/70 | HR 76 | Temp 97.9°F | Resp 16 | Ht 65.5 in | Wt 204.0 lb

## 2023-02-28 DIAGNOSIS — K219 Gastro-esophageal reflux disease without esophagitis: Secondary | ICD-10-CM | POA: Diagnosis not present

## 2023-02-28 DIAGNOSIS — D472 Monoclonal gammopathy: Secondary | ICD-10-CM

## 2023-02-28 DIAGNOSIS — Z136 Encounter for screening for cardiovascular disorders: Secondary | ICD-10-CM

## 2023-02-28 DIAGNOSIS — E7800 Pure hypercholesterolemia, unspecified: Secondary | ICD-10-CM | POA: Diagnosis not present

## 2023-02-28 DIAGNOSIS — D649 Anemia, unspecified: Secondary | ICD-10-CM

## 2023-02-28 DIAGNOSIS — L80 Vitiligo: Secondary | ICD-10-CM

## 2023-02-28 DIAGNOSIS — D473 Essential (hemorrhagic) thrombocythemia: Secondary | ICD-10-CM

## 2023-02-28 DIAGNOSIS — R221 Localized swelling, mass and lump, neck: Secondary | ICD-10-CM

## 2023-02-28 DIAGNOSIS — I10 Essential (primary) hypertension: Secondary | ICD-10-CM

## 2023-02-28 DIAGNOSIS — R911 Solitary pulmonary nodule: Secondary | ICD-10-CM

## 2023-02-28 MED ORDER — ATORVASTATIN CALCIUM 20 MG PO TABS: 20.0000 mg | ORAL_TABLET | Freq: Every day | ORAL | 3 refills | Status: DC

## 2023-02-28 MED ORDER — TRIAMTERENE-HCTZ 37.5-25 MG PO TABS: 0.5000 | ORAL_TABLET | Freq: Every day | ORAL | 1 refills | Status: DC

## 2023-02-28 NOTE — Progress Notes (Signed)
Subjective:    Patient ID: Kathleen Gould, female    DOB: 04-23-1966, 57 y.o.   MRN: [id number]  Patient here for  Chief Complaint  Patient presents with   Medical Management of Chronic Issues    HPI Here to follow up regarding hypertension, GERD, stress and hypercholesterolemia.  Saw pulmonary 02/20/23 - f/u cough and lung nodule.  Recommended PFTs. Also recommended f/u chest CT.  Regarding scheduling CT - cost is an issue.  Breathing is doing well.  Cough is better - resolved.  No chest pain.  No abdominal pain or bowel change.  Not using inhalers.  Retired.  Stress is better.     Past Medical History:  Diagnosis Date   Arthritis    Endometriosis    Environmental allergies    GERD (gastroesophageal reflux disease)    Hypercholesterolemia    Hypertension    Uterine fibroid    Past Surgical History:  Procedure Laterality Date   ABDOMINAL HYSTERECTOMY     CESAREAN SECTION     x2   OOPHORECTOMY     S/P left secondary to cyst   THYROGLOSSAL DUCT CYST N/A 05/27/2015   Procedure: THYROGLOSSAL DUCT CYST EXCISION ;  Surgeon: Geanie Logan, MD;  Location: [facility] ORS;  Service: ENT;  Laterality: N/A;   Family History  Problem Relation Age of Onset   Heart attack Father 75   Heart disease Father        bypass surgery   Hypertension Father    Hypercholesterolemia Father    Kidney disease Father    Diabetes Mother    Diabetes Other        aunt   Breast cancer Neg Hx    Social History   Socioeconomic History   Marital status: Married    Spouse name: Not on file   Number of children: 2   Years of education: Not on file   Highest education level: Not on file  Occupational History   Not on file  Tobacco Use   Smoking status: Never   Smokeless tobacco: Never  Vaping Use   Vaping Use: Never used  Substance and Sexual Activity   Alcohol use: No    Alcohol/week: 0.0 standard drinks of alcohol   Drug use: No   Sexual activity: Not Currently  Other Topics Concern   Not  on file  Social History Narrative   Widowed    Social Determinants of Health   Financial Resource Strain: Not on file  Food Insecurity: Not on file  Transportation Needs: Not on file  Physical Activity: Not on file  Stress: Not on file  Social Connections: Not on file     Review of Systems  Constitutional:  Negative for appetite change and unexpected weight change.  HENT:  Negative for congestion and sinus pressure.   Respiratory:  Negative for cough, chest tightness and shortness of breath.   Cardiovascular:  Negative for chest pain, palpitations and leg swelling.  Gastrointestinal:  Negative for abdominal pain, diarrhea, nausea and vomiting.  Genitourinary:  Negative for difficulty urinating and dysuria.  Musculoskeletal:  Negative for joint swelling and myalgias.  Skin:  Negative for color change and rash.  Neurological:  Negative for dizziness and headaches.  Psychiatric/Behavioral:  Negative for agitation and dysphoric mood.        Objective:     BP 114/70   Pulse 76   Temp 97.9 F (36.6 C)   Resp 16   Ht 5' 5.5" (1.664 m)  Wt 204 lb (92.5 kg)   LMP 11/06/2000   SpO2 99%   BMI 33.43 kg/m  Wt Readings from Last 3 Encounters:  02/28/23 204 lb (92.5 kg)  02/20/23 201 lb 3.2 oz (91.3 kg)  01/05/23 205 lb (93 kg)    Physical Exam Vitals reviewed.  Constitutional:      General: She is not in acute distress.    Appearance: Normal appearance.  HENT:     Head: Normocephalic and atraumatic.     Right Ear: External ear normal.     Left Ear: External ear normal.  Eyes:     General: No scleral icterus.       Right eye: No discharge.        Left eye: No discharge.     Conjunctiva/sclera: Conjunctivae normal.  Neck:     Thyroid: No thyromegaly.  Cardiovascular:     Rate and Rhythm: Normal rate and regular rhythm.  Pulmonary:     Effort: No respiratory distress.     Breath sounds: Normal breath sounds. No wheezing.  Abdominal:     General: Bowel sounds are  normal.     Palpations: Abdomen is soft.     Tenderness: There is no abdominal tenderness.  Musculoskeletal:        General: No swelling or tenderness.     Cervical back: Neck supple. No tenderness.  Lymphadenopathy:     Cervical: No cervical adenopathy.  Skin:    Findings: No erythema or rash.  Neurological:     Mental Status: She is alert.  Psychiatric:        Mood and Affect: Mood normal.        Behavior: Behavior normal.      Outpatient Encounter Medications as of 02/28/2023  Medication Sig   albuterol (VENTOLIN HFA) 108 (90 Base) MCG/ACT inhaler Inhale 2 puffs into the lungs every 4 (four) hours as needed.   felodipine (PLENDIL) 5 MG 24 hr tablet Take 1 tablet (5 mg total) by mouth daily.   fluticasone (FLONASE) 50 MCG/ACT nasal spray SPRAY 2 SPRAYS INTO EACH NOSTRIL EVERY DAY   pantoprazole (PROTONIX) 40 MG tablet Take 1 tablet (40 mg total) by mouth daily.   [DISCONTINUED] atorvastatin (LIPITOR) 20 MG tablet Take 1 tablet (20 mg total) by mouth daily.   [DISCONTINUED] triamterene-hydrochlorothiazide (MAXZIDE-25) 37.5-25 MG tablet Take 0.5 tablets by mouth daily.   atorvastatin (LIPITOR) 20 MG tablet Take 1 tablet (20 mg total) by mouth daily.   triamterene-hydrochlorothiazide (MAXZIDE-25) 37.5-25 MG tablet Take 0.5 tablets by mouth daily.   No facility-administered encounter medications on file as of 02/28/2023.     Lab Results  Component Value Date   WBC 5.1 10/14/2022   HGB 13.8 10/14/2022   HCT 41.7 10/14/2022   PLT 419 (H) 10/14/2022   GLUCOSE 84 10/27/2022   CHOL 217 (H) 10/27/2022   TRIG 76.0 10/27/2022   HDL 80.90 10/27/2022   LDLDIRECT 164.5 07/24/2013   LDLCALC 121 (H) 10/27/2022   ALT 16 10/14/2022   AST 17 10/14/2022   NA 140 10/27/2022   K 3.8 10/27/2022   CL 100 10/27/2022   CREATININE 0.73 10/27/2022   BUN 10 10/27/2022   CO2 31 10/27/2022   TSH 1.12 10/27/2022   INR 1.0 04/21/2014    MM 3D SCREEN BREAST BILATERAL  Result Date:  02/27/2023 CLINICAL DATA:  Screening. EXAM: DIGITAL SCREENING BILATERAL MAMMOGRAM WITH TOMOSYNTHESIS AND CAD TECHNIQUE: Bilateral screening digital craniocaudal and mediolateral oblique mammograms were obtained. Bilateral screening  digital breast tomosynthesis was performed. The images were evaluated with computer-aided detection. COMPARISON:  Previous exam(s). ACR Breast Density Category b: There are scattered areas of fibroglandular density. FINDINGS: There are no findings suspicious for malignancy. IMPRESSION: No mammographic evidence of malignancy. A result letter of this screening mammogram will be mailed directly to the patient. RECOMMENDATION: Screening mammogram in one year. (Code:SM-B-01Y) BI-RADS CATEGORY  1: Negative. Electronically Signed   By: Amie Portland M.D.   On: 02/27/2023 12:23       Assessment & Plan:  Pure hypercholesterolemia Assessment & Plan: On lipitor.  Low cholesterol diet and exercise.  Follow lipid panel and liver function tests.      Encounter for screening for coronary artery disease Assessment & Plan: Discussed with her today regarding screening give family history and risk factors.  Agreeable.  Obtain calcium score (CT).   Orders: -     CT CARDIAC SCORING (SELF PAY ONLY); Future  Anemia, unspecified type Assessment & Plan: Followed by hematology. Iron supplementation.     Gastroesophageal reflux disease, unspecified whether esophagitis present Assessment & Plan: On protonix.  No upper symptoms reported.     Essential (hemorrhagic) thrombocythemia (HCC) Assessment & Plan: Being followed by hematology.  Felt to be reactive.    Essential hypertension, benign Assessment & Plan: Continue plendil and triam/hctz.  Blood pressure doing well.  Follow pressures.  Follow metabolic panel.    Lung nodule, solitary Assessment & Plan: Saw Dr Aundria Rud.  Recommended f/u chest CT.  Cost is an issue. Wants to hold.     MGUS (monoclonal gammopathy of unknown  significance) Assessment & Plan: Followed by hematology.  Stable.     Neck nodule Assessment & Plan: Followed by ENT.  Just evaluated.  Recommended continued monitoring - one year.     Vitiligo Assessment & Plan: Has appt with dermatology this month.    Other orders -     Atorvastatin Calcium; Take 1 tablet (20 mg total) by mouth daily.  Dispense: 90 tablet; Refill: 3 -     Triamterene-HCTZ; Take 0.5 tablets by mouth daily.  Dispense: 45 tablet; Refill: 1     Dale Gilmore, MD

## 2023-02-28 NOTE — Telephone Encounter (Signed)
Lft pt vm to call ofc to sch CT. thanks 

## 2023-02-28 NOTE — Assessment & Plan Note (Signed)
On lipitor.  Low cholesterol diet and exercise.  Follow lipid panel and liver function tests.   

## 2023-03-03 ENCOUNTER — Telehealth: Payer: Self-pay | Admitting: Internal Medicine

## 2023-03-03 NOTE — Telephone Encounter (Signed)
Lft pt vm to call ofc to sch CT. thanks 

## 2023-03-05 ENCOUNTER — Encounter: Payer: Self-pay | Admitting: Internal Medicine

## 2023-03-05 DIAGNOSIS — Z136 Encounter for screening for cardiovascular disorders: Secondary | ICD-10-CM | POA: Insufficient documentation

## 2023-03-05 NOTE — Assessment & Plan Note (Signed)
Has appt with dermatology this month.  

## 2023-03-05 NOTE — Assessment & Plan Note (Signed)
Followed by ENT.  Just evaluated.  Recommended continued monitoring - one year.   

## 2023-03-05 NOTE — Assessment & Plan Note (Signed)
Discussed with her today regarding screening give family history and risk factors.  Agreeable.  Obtain calcium score (CT).

## 2023-03-05 NOTE — Assessment & Plan Note (Signed)
Saw Dr Aundria Rud.  Recommended f/u chest CT.  Cost is an issue. Wants to hold.

## 2023-03-05 NOTE — Assessment & Plan Note (Signed)
Followed by hematology.  Stable.  

## 2023-03-05 NOTE — Assessment & Plan Note (Signed)
Being followed by hematology.  Felt to be reactive.  

## 2023-03-05 NOTE — Assessment & Plan Note (Signed)
Followed by hematology. Iron supplementation.   

## 2023-03-05 NOTE — Assessment & Plan Note (Signed)
Continue plendil and triam/hctz.  Blood pressure doing well.  Follow pressures.  Follow metabolic panel.  

## 2023-03-05 NOTE — Assessment & Plan Note (Signed)
On protonix.  No upper symptoms reported.   

## 2023-03-08 ENCOUNTER — Telehealth: Payer: Self-pay | Admitting: Internal Medicine

## 2023-03-08 NOTE — Telephone Encounter (Signed)
Lft pt vm to call ofc to sch CT. thanks 

## 2023-03-22 ENCOUNTER — Ambulatory Visit: Payer: BC Managed Care – PPO | Admitting: Dermatology

## 2023-03-22 VITALS — BP 121/76

## 2023-03-22 DIAGNOSIS — Z7189 Other specified counseling: Secondary | ICD-10-CM

## 2023-03-22 DIAGNOSIS — L80 Vitiligo: Secondary | ICD-10-CM | POA: Diagnosis not present

## 2023-03-22 DIAGNOSIS — Z79899 Other long term (current) drug therapy: Secondary | ICD-10-CM

## 2023-03-22 MED ORDER — OPZELURA 1.5 % EX CREA: 1.0000 | TOPICAL_CREAM | Freq: Two times a day (BID) | CUTANEOUS | 6 refills | Status: DC

## 2023-03-22 NOTE — Patient Instructions (Addendum)
Vitiligo is a chronic autoimmune condition which causes loss of skin pigment and is commonly seen on the face and may also involve areas of trauma like hands, elbows, knees, and ankles. There is no cure and it is difficult to treat.  Treatments include topical steroids and other topical anti-inflammatory ointments/creams and topical and oral Jak inhibitors.  Sometimes narrow band UV light therapy or Xtrac laser is helpful, both of which require twice weekly treatments for at least 3-6 months.  Antioxidant vitamins, such as Vitamins C,E,D, Folic Acid, B12, and Alpha Lipoic Acid may be added to enhance treatment. Heliocare may also enhance treatment results.   Start Opzelura cream 2 times a day to areas of vitiligo  Due to recent changes in healthcare laws, you may see results of your pathology and/or laboratory studies on MyChart before the doctors have had a chance to review them. We understand that in some cases there may be results that are confusing or concerning to you. Please understand that not all results are received at the same time and often the doctors may need to interpret multiple results in order to provide you with the best plan of care or course of treatment. Therefore, we ask that you please give [place] 2 business days to thoroughly review all your results before contacting the office for clarification. Should we see a critical lab result, you will be contacted sooner.   If You Need Anything After Your Visit  If you have any questions or concerns for your doctor, please call our main line at [phone] and press option 4 to reach your doctor's medical assistant. If no one answers, please leave a voicemail as directed and we will return your call as soon as possible. Messages left after 4 pm will be answered the following business day.   You may also send [place] a message via MyChart. We typically respond to MyChart messages within 1-2 business days.  For prescription refills, please ask your  pharmacy to contact our office. Our fax number is [phone].  If you have an urgent issue when the clinic is closed that cannot wait until the next business day, you can page your doctor at the number below.    Please note that while we do our best to be available for urgent issues outside of office hours, we are not available 24/7.   If you have an urgent issue and are unable to reach [place], you may choose to seek medical care at your doctor's office, retail clinic, urgent care center, or emergency room.  If you have a medical emergency, please immediately call 911 or go to the emergency department.  Pager Numbers  - Dr. Gwen Pounds: [phone]  - Dr. Neale Burly: [phone]  - Dr. Roseanne Reno: [phone]  In the event of inclement weather, please call our main line at [phone] for an update on the status of any delays or closures.  Dermatology Medication Tips: Please keep the boxes that topical medications come in in order to help keep track of the instructions about where and how to use these. Pharmacies typically print the medication instructions only on the boxes and not directly on the medication tubes.   If your medication is too expensive, please contact our office at [phone] option 4 or send [place] a message through MyChart.   We are unable to tell what your co-pay for medications will be in advance as this is different depending on your insurance coverage. However, we may be able to find a substitute medication  at lower cost or fill out paperwork to get insurance to cover a needed medication.   If a prior authorization is required to get your medication covered by your insurance company, please allow [place] 1-2 business days to complete this process.  Drug prices often vary depending on where the prescription is filled and some pharmacies may offer cheaper prices.  The website www.goodrx.com contains coupons for medications through different pharmacies. The prices here do not  account for what the cost may be with help from insurance (it may be cheaper with your insurance), but the website can give you the price if you did not use any insurance.  - You can print the associated coupon and take it with your prescription to the pharmacy.  - You may also stop by our office during regular business hours and pick up a GoodRx coupon card.  - If you need your prescription sent electronically to a different pharmacy, notify our office through [facility] or by phone at [phone] option 4.     Si Usted Necesita Algo Despus de Su Visita  Tambin puede enviarnos un mensaje a travs de Clinical cytogeneticist. Por lo general respondemos a los mensajes de MyChart en el transcurso de 1 a 2 das hbiles.  Para renovar recetas, por favor pida a su farmacia que se ponga en contacto con nuestra oficina. Annie Sable de fax es [place] [phone].  Si tiene un asunto urgente cuando la clnica est cerrada y que no puede esperar hasta el siguiente da hbil, puede llamar/localizar a su doctor(a) al nmero que aparece a continuacin.   Por favor, tenga en cuenta que aunque hacemos todo lo posible para estar disponibles para asuntos urgentes fuera del horario de [place], no estamos disponibles las 24 horas del da, los 7 [address]  [address] de la [place].   Si tiene un problema urgente y no puede comunicarse con nosotros, puede optar por buscar atencin mdica  en el consultorio de su doctor(a), en una clnica privada, en un centro de atencin urgente o en una sala de emergencias.  Si tiene Engineer, drilling, por favor llame inmediatamente al 911 o vaya a la sala de emergencias.  Nmeros de bper  - Dr. Gwen Pounds: [phone]  - Dra. Moye: [phone]  - Dra. Roseanne Reno: [phone]  En caso de inclemencias del [place], por favor llame a Lacy Duverney principal al [phone] para una actualizacin sobre el [place] de cualquier retraso o cierre.  Consejos para la medicacin en dermatologa: Por  favor, guarde las cajas en las que vienen los medicamentos de uso tpico para ayudarle a seguir las instrucciones sobre dnde y cmo usarlos. Las farmacias generalmente imprimen las instrucciones del medicamento slo en las cajas y no directamente en los tubos del [place].   Si su medicamento es muy caro, por favor, pngase en contacto con Rolm Gala llamando al [phone] y presione la opcin 4 o envenos un mensaje a travs de Clinical cytogeneticist.   No podemos decirle cul ser su copago por los medicamentos por adelantado ya que esto es diferente dependiendo de la cobertura de su seguro. Sin embargo, es posible que podamos encontrar un medicamento sustituto a Audiological scientist un formulario para que el seguro cubra el medicamento que se considera necesario.   Si se requiere una autorizacin previa para que su compaa de seguros [place] su medicamento, por favor permtanos de 1 a 2 das hbiles para completar [address]  Los precios de los medicamentos varan con frecuencia dependiendo del Environmental consultant de dnde  se surte la receta y alguna farmacias pueden ofrecer precios ms baratos.  El sitio web www.goodrx.com tiene cupones para medicamentos de Health and safety inspector. Los precios aqu no tienen en cuenta lo que podra costar con la ayuda del seguro (puede ser ms barato con su seguro), pero el sitio web puede darle el precio si no utiliz Tourist information centre manager.  - Puede imprimir el cupn correspondiente y llevarlo con su receta a la farmacia.  - Tambin puede pasar por nuestra oficina durante el horario de atencin regular y Education officer, museum una tarjeta de cupones de GoodRx.  - Si necesita que su receta se enve electrnicamente a una farmacia diferente, informe a nuestra oficina a travs de MyChart de [place] o por telfono llamando al [phone] y presione la opcin 4.

## 2023-03-22 NOTE — Progress Notes (Signed)
   New Patient Visit   Subjective  Kathleen Gould is a 57 y.o. female who presents for the following: Vitiligo?, R wrist, chest, L elbow, ~60m, no treatment.   New patient referral from Dr. Dale Dublin  The following portions of the chart were reviewed this encounter and updated as appropriate: medications, allergies, medical history  Review of Systems:  No other skin or systemic complaints except as noted in HPI or Assessment and Plan.  Objective  Well appearing patient in no apparent distress; mood and affect are within normal limits. A focused examination was performed of the following areas: R arm, chest, L arm, face, neck, axilla  Relevant exam findings are noted in the Assessment and Plan.   Assessment & Plan   VITILIGO R wrist, L elbow, chest Exam: depigmented patches on R wrist, L elbow, chest R volar wrist ~5.0 x 5.0cm L elbow ~2.5 x 2.0cm R chest ~3.0cm Midline chest ~1.0cm  Labs show TSH from June 2023 was normal. Recommend repeat TSH soon.  Vitiligo is a chronic autoimmune condition which causes loss of skin pigment and is commonly seen on the face and may also involve areas of trauma like hands, elbows, knees, and ankles. There is no cure and it is difficult to treat.  Treatments include topical steroids and other topical anti-inflammatory ointments/creams and topical and oral Jak inhibitors.  Sometimes narrow band UV light therapy or Xtrac laser is helpful, both of which require twice weekly treatments for at least 3-6 months.  Antioxidant vitamins, such as Vitamins A,C,E,D, Folic Acid and B12 may be added to enhance treatment. Heliocare may also enhance treatment results.  Treatment Plan: Start Opzelura cream bid to aa R wrist, chest, L elbow, sample x 1 Lot [id number] exp 12/27/23   Return in about 6 months (around 09/22/2023) for vitiligo.  I, Ardis Rowan, RMA, am acting as scribe for Armida Sans, MD .  Documentation: I have reviewed the above  documentation for accuracy and completeness, and I agree with the above.  Armida Sans, MD

## 2023-03-26 ENCOUNTER — Encounter: Payer: Self-pay | Admitting: Dermatology

## 2023-04-03 ENCOUNTER — Inpatient Hospital Stay: Payer: BC Managed Care – PPO | Attending: Internal Medicine

## 2023-04-03 DIAGNOSIS — D472 Monoclonal gammopathy: Secondary | ICD-10-CM | POA: Diagnosis present

## 2023-04-03 DIAGNOSIS — L80 Vitiligo: Secondary | ICD-10-CM | POA: Insufficient documentation

## 2023-04-03 DIAGNOSIS — R911 Solitary pulmonary nodule: Secondary | ICD-10-CM | POA: Diagnosis not present

## 2023-04-03 DIAGNOSIS — E876 Hypokalemia: Secondary | ICD-10-CM | POA: Diagnosis not present

## 2023-04-03 LAB — CBC WITH DIFFERENTIAL (CANCER CENTER ONLY)
Abs Immature Granulocytes: 0.01 10*3/uL (ref 0.00–0.07)
Basophils Absolute: 0 10*3/uL (ref 0.0–0.1)
Basophils Relative: 1 %
Eosinophils Absolute: 0.3 10*3/uL (ref 0.0–0.5)
Eosinophils Relative: 5 %
HCT: 39.7 % (ref 36.0–46.0)
Hemoglobin: 13.2 g/dL (ref 12.0–15.0)
Immature Granulocytes: 0 %
Lymphocytes Relative: 35 %
Lymphs Abs: 2.1 10*3/uL (ref 0.7–4.0)
MCH: 28.9 pg (ref 26.0–34.0)
MCHC: 33.2 g/dL (ref 30.0–36.0)
MCV: 87.1 fL (ref 80.0–100.0)
Monocytes Absolute: 0.3 10*3/uL (ref 0.1–1.0)
Monocytes Relative: 6 %
Neutro Abs: 3.1 10*3/uL (ref 1.7–7.7)
Neutrophils Relative %: 53 %
Platelet Count: 379 10*3/uL (ref 150–400)
RBC: 4.56 MIL/uL (ref 3.87–5.11)
RDW: 13.5 % (ref 11.5–15.5)
WBC Count: 5.9 10*3/uL (ref 4.0–10.5)
nRBC: 0 % (ref 0.0–0.2)

## 2023-04-03 LAB — CMP (CANCER CENTER ONLY)
ALT: 18 U/L (ref 0–44)
AST: 18 U/L (ref 15–41)
Albumin: 4.2 g/dL (ref 3.5–5.0)
Alkaline Phosphatase: 66 U/L (ref 38–126)
Anion gap: 8 (ref 5–15)
BUN: 10 mg/dL (ref 6–20)
CO2: 24 mmol/L (ref 22–32)
Calcium: 9.3 mg/dL (ref 8.9–10.3)
Chloride: 104 mmol/L (ref 98–111)
Creatinine: 0.89 mg/dL (ref 0.44–1.00)
GFR, Estimated: >60 mL/min (ref >60)
Glucose, Bld: 121 mg/dL — ABNORMAL HIGH (ref 70–99)
Potassium: 3.6 mmol/L (ref 3.5–5.1)
Sodium: 136 mmol/L (ref 135–145)
Total Bilirubin: 0.5 mg/dL (ref 0.3–1.2)
Total Protein: 7.4 g/dL (ref 6.5–8.1)

## 2023-04-03 LAB — IRON AND TIBC
Iron: 100 ug/dL (ref 28–170)
Saturation Ratios: 32 % — ABNORMAL HIGH (ref 10.4–31.8)
TIBC: 312 ug/dL (ref 250–450)
UIBC: 212 ug/dL

## 2023-04-03 LAB — FERRITIN: Ferritin: 127 ng/mL (ref 11–307)

## 2023-04-06 LAB — MULTIPLE MYELOMA PANEL, SERUM
Albumin SerPl Elph-Mcnc: 3.8 g/dL (ref 2.9–4.4)
Albumin/Glob SerPl: 1.3 (ref 0.7–1.7)
Alpha 1: 0.2 g/dL (ref 0.0–0.4)
Alpha2 Glob SerPl Elph-Mcnc: 0.6 g/dL (ref 0.4–1.0)
B-Globulin SerPl Elph-Mcnc: 1.1 g/dL (ref 0.7–1.3)
Gamma Glob SerPl Elph-Mcnc: 1.1 g/dL (ref 0.4–1.8)
Globulin, Total: 3 g/dL (ref 2.2–3.9)
IgA: 275 mg/dL (ref 87–352)
IgG (Immunoglobin G), Serum: 1268 mg/dL (ref 586–1602)
IgM (Immunoglobulin M), Srm: 115 mg/dL (ref 26–217)
M Protein SerPl Elph-Mcnc: 0.3 g/dL — ABNORMAL HIGH
Total Protein ELP: 6.8 g/dL (ref 6.0–8.5)

## 2023-04-21 ENCOUNTER — Ambulatory Visit: Payer: BC Managed Care – PPO | Admitting: Internal Medicine

## 2023-04-26 ENCOUNTER — Ambulatory Visit: Payer: BC Managed Care – PPO | Admitting: Internal Medicine

## 2023-04-28 ENCOUNTER — Inpatient Hospital Stay: Payer: BC Managed Care – PPO | Admitting: Internal Medicine

## 2023-04-28 ENCOUNTER — Encounter: Payer: Self-pay | Admitting: Internal Medicine

## 2023-04-28 DIAGNOSIS — D472 Monoclonal gammopathy: Secondary | ICD-10-CM | POA: Diagnosis not present

## 2023-04-28 NOTE — Progress Notes (Signed)
[facility] OFFICE PROGRESS NOTE  Patient Care Team: Dale Van Horn, MD as PCP - General (Internal Medicine) Stanton Kidney, MD as Consulting Physician (Gastroenterology) Abe People as Physician Assistant Earna Coder, MD as Consulting Physician (Oncology)   Cancer Staging  No matching staging information was found for the patient.   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 Serum LDH, Cr, CBC, Flow Cytometry, Skeletal survey all unremarkable.   # RML lung nodule 4mm [June 2018; incidental]  Oncology History   No history exists.     INTERVAL HISTORY: Patient ambulating-independently.  Alone.  Kathleen Gould 57 y.o.  female pleasant patient above history of MGUS; and iron deficiency is here for follow-up.   Patient denies any tingling and numbness. Denies any weight loss or unusual bone pain. No nausea no vomiting no weight loss.  Patient denies any unusual shortness of breath or cough.  Review of Systems  Constitutional:  Negative for chills, diaphoresis, fever, malaise/fatigue and weight loss.  HENT:  Negative for nosebleeds and sore throat.   Eyes:  Negative for double vision.  Respiratory:  Negative for cough, hemoptysis, sputum production, shortness of breath and wheezing.   Cardiovascular:  Negative for chest pain, palpitations, orthopnea and leg swelling.  Gastrointestinal:  Negative for abdominal pain, blood in stool, constipation, diarrhea, heartburn, melena, nausea and vomiting.  Genitourinary:  Negative for dysuria, frequency and urgency.  Musculoskeletal:  Positive for joint pain. Negative for back pain.  Skin: Negative.  Negative for itching and rash.  Neurological:  Negative for dizziness, tingling, focal weakness, weakness and headaches.  Endo/Heme/Allergies:  Does not bruise/bleed easily.   Psychiatric/Behavioral:  Negative for depression. The patient is not nervous/anxious and does not have insomnia.       PAST MEDICAL HISTORY :  Past Medical History:  Diagnosis Date   Arthritis    Endometriosis    Environmental allergies    GERD (gastroesophageal reflux disease)    Hypercholesterolemia    Hypertension    Uterine fibroid     PAST SURGICAL HISTORY :   Past Surgical History:  Procedure Laterality Date   ABDOMINAL HYSTERECTOMY     CESAREAN SECTION     x2   OOPHORECTOMY     S/P left secondary to cyst   THYROGLOSSAL DUCT CYST N/A 05/27/2015   Procedure: THYROGLOSSAL DUCT CYST EXCISION ;  Surgeon: Geanie Logan, MD;  Location: [facility] ORS;  Service: ENT;  Laterality: N/A;    FAMILY HISTORY :   Family History  Problem Relation Age of Onset   Heart attack Father 26   Heart disease Father        bypass surgery   Hypertension Father    Hypercholesterolemia Father    Kidney disease Father    Diabetes Mother    Diabetes Other        aunt   Breast cancer Neg Hx     SOCIAL HISTORY:   Social History   Tobacco Use   Smoking status: Never   Smokeless tobacco: Never  Vaping Use   Vaping status: Never Used  Substance Use Topics   Alcohol use: No    Alcohol/week: 0.0 standard drinks of alcohol   Drug use: No    ALLERGIES:  is allergic to cefdinir, levofloxacin, and simvastatin.  MEDICATIONS:  Current Outpatient Medications  Medication Sig Dispense Refill   albuterol (VENTOLIN HFA) 108 (90  Base) MCG/ACT inhaler Inhale 2 puffs into the lungs every 4 (four) hours as needed. 18 g 1   atorvastatin (LIPITOR) 20 MG tablet Take 1 tablet (20 mg total) by mouth daily. 90 tablet 3   felodipine (PLENDIL) 5 MG 24 hr tablet Take 1 tablet (5 mg total) by mouth daily. 90 tablet 1   fluticasone (FLONASE) 50 MCG/ACT nasal spray SPRAY 2 SPRAYS INTO EACH NOSTRIL EVERY DAY 48 mL 0   pantoprazole (PROTONIX) 40 MG tablet Take 1 tablet (40 mg total) by mouth daily. 90 tablet 1    Ruxolitinib Phosphate (OPZELURA) 1.5 % CREA Apply 1 Application topically 2 (two) times daily. bid to aa arms, chest for vitiligo 60 g 6   triamterene-hydrochlorothiazide (MAXZIDE-25) 37.5-25 MG tablet Take 0.5 tablets by mouth daily. 45 tablet 1   No current facility-administered medications for this visit.    PHYSICAL EXAMINATION: ECOG PERFORMANCE STATUS: 0 - Asymptomatic  BP 132/81 (BP Location: Left Arm, Patient Position: Sitting, Cuff Size: Normal)   Pulse 81   Temp 97.7 F (36.5 C) (Tympanic)   Ht 5' 5.5" (1.664 m)   Wt 208 lb 9.6 oz (94.6 kg)   LMP 11/06/2000   SpO2 100%   BMI 34.18 kg/m   Filed Weights   04/28/23 0836  Weight: 208 lb 9.6 oz (94.6 kg)    Physical Exam Vitals and nursing note reviewed.  HENT:     Head: Normocephalic and atraumatic.     Mouth/Throat:     Pharynx: Oropharynx is clear.  Eyes:     Extraocular Movements: Extraocular movements intact.     Pupils: Pupils are equal, round, and reactive to light.  Cardiovascular:     Rate and Rhythm: Normal rate and regular rhythm.  Pulmonary:     Comments: Decreased breath sounds bilaterally.  Abdominal:     Palpations: Abdomen is soft.  Musculoskeletal:        General: Normal range of motion.     Cervical back: Normal range of motion.  Skin:    General: Skin is warm.  Neurological:     General: No focal deficit present.     Mental Status: She is alert and oriented to person, place, and time.  Psychiatric:        Behavior: Behavior normal.        Judgment: Judgment normal.      LABORATORY DATA:  I have reviewed the data as listed    Component Value Date/Time   NA 136 04/03/2023 1452   K 3.6 04/03/2023 1452   K 3.9 05/27/2015 0615   CL 104 04/03/2023 1452   CO2 24 04/03/2023 1452   GLUCOSE 121 (H) 04/03/2023 1452   BUN 10 04/03/2023 1452   CREATININE 0.89 04/03/2023 1452   CREATININE 0.81 04/21/2014 1629   CALCIUM 9.3 04/03/2023 1452   CALCIUM 9.3 04/21/2014 1629   PROT 7.4  04/03/2023 1452   ALBUMIN 4.2 04/03/2023 1452   AST 18 04/03/2023 1452   ALT 18 04/03/2023 1452   ALKPHOS 66 04/03/2023 1452   BILITOT 0.5 04/03/2023 1452   GFRNONAA >60 04/03/2023 1452   GFRNONAA >60 04/21/2014 1629   GFRAA >60 01/08/2020 1547   GFRAA >60 04/21/2014 1629    No results found for: SPEP, UPEP  Lab Results  Component Value Date   WBC 5.9 04/03/2023   NEUTROABS 3.1 04/03/2023   HGB 13.2 04/03/2023   HCT 39.7 04/03/2023   MCV 87.1 04/03/2023   PLT 379 04/03/2023  Chemistry      Component Value Date/Time   NA 136 04/03/2023 1452   K 3.6 04/03/2023 1452   K 3.9 05/27/2015 0615   CL 104 04/03/2023 1452   CO2 24 04/03/2023 1452   BUN 10 04/03/2023 1452   CREATININE 0.89 04/03/2023 1452   CREATININE 0.81 04/21/2014 1629      Component Value Date/Time   CALCIUM 9.3 04/03/2023 1452   CALCIUM 9.3 04/21/2014 1629   ALKPHOS 66 04/03/2023 1452   AST 18 04/03/2023 1452   ALT 18 04/03/2023 1452   BILITOT 0.5 04/03/2023 1452      RADIOGRAPHIC STUDIES: I have personally reviewed the radiological images as listed and agreed with the findings in the report. No results found.   ASSESSMENT & PLAN:  MGUS (monoclonal gammopathy of unknown significance) Asymptomatic: [since 2018] Patient's M protein in March 2022 0.4g/dL normal kappa Lambda light chain ratio. CBC CMP today are normal. AUG 2024- 0.3 gm/dl; today Myeloma panel/ lambda light chain monoclonal protein= wnl;   #I again had a long conversation with the patient regarding the pathophysiology of MGUS/small risk of turning into multiple myeloma about 1 %/year. Continue annual surveillance.   #Mild hypokalemia potassium 3.3-likely secondary to hydrochlorothiazide.  Dietary intervention discussed.  # Vitiligo- [Dr.Kowalski]- s/p opselera.   # Iron def- ? NOV 2023- ferritin-10; iron sat- colonoscopy- 2021-[facility] [Dr.Sakai]; no re: EGD-OFF iron- I sat 32%; ferritin > 100. Monitor for now.   # DISPOSITION: #  follow up in 12  months- MD; 2 week prior- labs- cbc/cmp;MM panel; k/l light chains; iron studies/ferritin-- Dr.B    Orders Placed This Encounter  Procedures   CBC with Differential (Cancer Center Only)    Standing Status:   Future    Standing Expiration Date:   04/27/2024   CMP (Cancer Center only)    Standing Status:   Future    Standing Expiration Date:   04/27/2024   Kappa/lambda light chains    Standing Status:   Future    Standing Expiration Date:   04/27/2024   Multiple Myeloma Panel (SPEP&IFE w/QIG)    Standing Status:   Future    Standing Expiration Date:   04/27/2024   Iron and TIBC    Standing Status:   Future    Standing Expiration Date:   04/27/2024   Ferritin    Standing Status:   Future    Standing Expiration Date:   04/27/2024   All questions were answered. The patient knows to call the clinic with any problems, questions or concerns.      Earna Coder, MD 04/28/2023 9:30 AM

## 2023-04-28 NOTE — Progress Notes (Signed)
Has some trouble sleeping, no sleep aid.  Newly dx with vitiligo.  No questions/concerns today.

## 2023-04-28 NOTE — Assessment & Plan Note (Addendum)
Asymptomatic: [since 2018] Patient's M protein in March 2022 0.4g/dL normal kappa Lambda light chain ratio. CBC CMP today are normal. AUG 2024- 0.3 gm/dl; today Myeloma panel/ lambda light chain monoclonal protein= wnl;   #I again had a long conversation with the patient regarding the pathophysiology of MGUS/small risk of turning into multiple myeloma about 1 %/year. Continue annual surveillance.   #Mild hypokalemia potassium 3.3-likely secondary to hydrochlorothiazide.  Dietary intervention discussed.  # Vitiligo- [Dr.Kowalski]- s/p opselera.   # Iron def- ? NOV 2023- ferritin-10; iron sat- colonoscopy- 2021-[facility] [Dr.Sakai]; no re: EGD-OFF iron- I sat 32%; ferritin > 100. Monitor for now.   # DISPOSITION: # follow up in 12  months- MD; 2 week prior- labs- cbc/cmp;MM panel; k/l light chains; iron studies/ferritin-- Dr.B

## 2023-05-24 ENCOUNTER — Ambulatory Visit: Payer: BC Managed Care – PPO | Admitting: Student in an Organized Health Care Education/Training Program

## 2023-07-05 ENCOUNTER — Encounter: Payer: BC Managed Care – PPO | Admitting: Internal Medicine

## 2023-07-06 ENCOUNTER — Ambulatory Visit (INDEPENDENT_AMBULATORY_CARE_PROVIDER_SITE_OTHER): Payer: BC Managed Care – PPO | Admitting: Internal Medicine

## 2023-07-06 VITALS — BP 120/70 | HR 92 | Temp 98.0°F | Resp 16 | Ht 65.5 in | Wt 210.0 lb

## 2023-07-06 DIAGNOSIS — D473 Essential (hemorrhagic) thrombocythemia: Secondary | ICD-10-CM

## 2023-07-06 DIAGNOSIS — I10 Essential (primary) hypertension: Secondary | ICD-10-CM

## 2023-07-06 DIAGNOSIS — E7800 Pure hypercholesterolemia, unspecified: Secondary | ICD-10-CM | POA: Diagnosis not present

## 2023-07-06 DIAGNOSIS — Z0000 Encounter for general adult medical examination without abnormal findings: Secondary | ICD-10-CM | POA: Diagnosis not present

## 2023-07-06 DIAGNOSIS — D472 Monoclonal gammopathy: Secondary | ICD-10-CM

## 2023-07-06 DIAGNOSIS — F439 Reaction to severe stress, unspecified: Secondary | ICD-10-CM | POA: Diagnosis not present

## 2023-07-06 DIAGNOSIS — Z23 Encounter for immunization: Secondary | ICD-10-CM

## 2023-07-06 DIAGNOSIS — Z136 Encounter for screening for cardiovascular disorders: Secondary | ICD-10-CM

## 2023-07-06 DIAGNOSIS — L80 Vitiligo: Secondary | ICD-10-CM | POA: Diagnosis not present

## 2023-07-06 DIAGNOSIS — R911 Solitary pulmonary nodule: Secondary | ICD-10-CM

## 2023-07-06 DIAGNOSIS — K219 Gastro-esophageal reflux disease without esophagitis: Secondary | ICD-10-CM

## 2023-07-06 NOTE — Progress Notes (Signed)
Subjective:    Patient ID: Kathleen Gould, female    DOB: 1966-07-19, 57 y.o.   MRN: [id number]  Patient here for  Chief Complaint  Patient presents with   Annual Exam    HPI Here for a physical exam. New job. Working at [employer]. Appears to be doing well.  Appears to be handling stress well.  Tries to stay active.  No chest pain or sob reported.  Going to PT - shoulder pain.  Continues exercise.     Past Medical History:  Diagnosis Date   Arthritis    Endometriosis    Environmental allergies    GERD (gastroesophageal reflux disease)    Hypercholesterolemia    Hypertension    Uterine fibroid    Past Surgical History:  Procedure Laterality Date   ABDOMINAL HYSTERECTOMY     CESAREAN SECTION     x2   OOPHORECTOMY     S/P left secondary to cyst   THYROGLOSSAL DUCT CYST N/A 05/27/2015   Procedure: THYROGLOSSAL DUCT CYST EXCISION ;  Surgeon: Geanie Logan, MD;  Location: [facility] ORS;  Service: ENT;  Laterality: N/A;   Family History  Problem Relation Age of Onset   Heart attack Father 48   Heart disease Father        bypass surgery   Hypertension Father    Hypercholesterolemia Father    Kidney disease Father    Diabetes Mother    Diabetes Other        aunt   Breast cancer Neg Hx    Social History   Socioeconomic History   Marital status: Married    Spouse name: Not on file   Number of children: 2   Years of education: Not on file   Highest education level: Not on file  Occupational History   Not on file  Tobacco Use   Smoking status: Never   Smokeless tobacco: Never  Vaping Use   Vaping status: Never Used  Substance and Sexual Activity   Alcohol use: No    Alcohol/week: 0.0 standard drinks of alcohol   Drug use: No   Sexual activity: Not Currently  Other Topics Concern   Not on file  Social History Narrative   Widowed    Social Determinants of Health   Financial Resource Strain: Not on file  Food Insecurity: Not on file   Transportation Needs: Not on file  Physical Activity: Not on file  Stress: Not on file  Social Connections: Not on file     Review of Systems  Constitutional:  Negative for appetite change and unexpected weight change.  HENT:  Negative for congestion, sinus pressure and sore throat.   Eyes:  Negative for pain and visual disturbance.  Respiratory:  Negative for cough, chest tightness and shortness of breath.   Cardiovascular:  Negative for chest pain and palpitations.  Gastrointestinal:  Negative for abdominal pain, diarrhea, nausea and vomiting.  Genitourinary:  Negative for difficulty urinating and dysuria.  Musculoskeletal:  Negative for joint swelling and myalgias.  Skin:  Negative for color change and rash.  Neurological:  Negative for dizziness and headaches.  Hematological:  Negative for adenopathy. Does not bruise/bleed easily.  Psychiatric/Behavioral:  Negative for agitation and dysphoric mood.        Objective:     BP 120/70   Pulse 92   Temp 98 F (36.7 C)   Resp 16   Ht 5' 5.5" (1.664 m)   Wt 210 lb (95.3 kg)  LMP 11/06/2000   SpO2 98%   BMI 34.41 kg/m  Wt Readings from Last 3 Encounters:  07/06/23 210 lb (95.3 kg)  04/28/23 208 lb 9.6 oz (94.6 kg)  02/28/23 204 lb (92.5 kg)    Physical Exam Vitals reviewed.  Constitutional:      General: She is not in acute distress.    Appearance: Normal appearance. She is well-developed.  HENT:     Head: Normocephalic and atraumatic.     Right Ear: External ear normal.     Left Ear: External ear normal.  Eyes:     General: No scleral icterus.       Right eye: No discharge.        Left eye: No discharge.     Conjunctiva/sclera: Conjunctivae normal.  Neck:     Thyroid: No thyromegaly.  Cardiovascular:     Rate and Rhythm: Normal rate and regular rhythm.  Pulmonary:     Effort: No tachypnea, accessory muscle usage or respiratory distress.     Breath sounds: Normal breath sounds. No decreased breath sounds  or wheezing.  Chest:  Breasts:    Right: No inverted nipple, mass, nipple discharge or tenderness (no axillary adenopathy).     Left: No inverted nipple, mass, nipple discharge or tenderness (no axilarry adenopathy).  Abdominal:     General: Bowel sounds are normal.     Palpations: Abdomen is soft.     Tenderness: There is no abdominal tenderness.  Musculoskeletal:        General: No swelling or tenderness.     Cervical back: Neck supple.  Lymphadenopathy:     Cervical: No cervical adenopathy.  Skin:    Findings: No erythema or rash.  Neurological:     Mental Status: She is alert and oriented to person, place, and time.  Psychiatric:        Mood and Affect: Mood normal.        Behavior: Behavior normal.      Outpatient Encounter Medications as of 07/06/2023  Medication Sig   albuterol (VENTOLIN HFA) 108 (90 Base) MCG/ACT inhaler Inhale 2 puffs into the lungs every 4 (four) hours as needed.   atorvastatin (LIPITOR) 20 MG tablet Take 1 tablet (20 mg total) by mouth daily.   felodipine (PLENDIL) 5 MG 24 hr tablet Take 1 tablet (5 mg total) by mouth daily.   fluticasone (FLONASE) 50 MCG/ACT nasal spray SPRAY 2 SPRAYS INTO EACH NOSTRIL EVERY DAY   pantoprazole (PROTONIX) 40 MG tablet Take 1 tablet (40 mg total) by mouth daily.   Ruxolitinib Phosphate (OPZELURA) 1.5 % CREA Apply 1 Application topically 2 (two) times daily. bid to aa arms, chest for vitiligo   triamterene-hydrochlorothiazide (MAXZIDE-25) 37.5-25 MG tablet Take 0.5 tablets by mouth daily.   No facility-administered encounter medications on file as of 07/06/2023.     Lab Results  Component Value Date   WBC 5.9 04/03/2023   HGB 13.2 04/03/2023   HCT 39.7 04/03/2023   PLT 379 04/03/2023   GLUCOSE 121 (H) 04/03/2023   CHOL 217 (H) 10/27/2022   TRIG 76.0 10/27/2022   HDL 80.90 10/27/2022   LDLDIRECT 164.5 07/24/2013   LDLCALC 121 (H) 10/27/2022   ALT 18 04/03/2023   AST 18 04/03/2023   NA 136 04/03/2023   K 3.6  04/03/2023   CL 104 04/03/2023   CREATININE 0.89 04/03/2023   BUN 10 04/03/2023   CO2 24 04/03/2023   TSH 1.12 10/27/2022   INR 1.0 04/21/2014  MM 3D SCREEN BREAST BILATERAL  Result Date: 02/27/2023 CLINICAL DATA:  Screening. EXAM: DIGITAL SCREENING BILATERAL MAMMOGRAM WITH TOMOSYNTHESIS AND CAD TECHNIQUE: Bilateral screening digital craniocaudal and mediolateral oblique mammograms were obtained. Bilateral screening digital breast tomosynthesis was performed. The images were evaluated with computer-aided detection. COMPARISON:  Previous exam(s). ACR Breast Density Category b: There are scattered areas of fibroglandular density. FINDINGS: There are no findings suspicious for malignancy. IMPRESSION: No mammographic evidence of malignancy. A result letter of this screening mammogram will be mailed directly to the patient. RECOMMENDATION: Screening mammogram in one year. (Code:SM-B-01Y) BI-RADS CATEGORY  1: Negative. Electronically Signed   By: Amie Portland M.D.   On: 02/27/2023 12:23       Assessment & Plan:  Routine general medical examination at a health care facility  Health care maintenance Assessment & Plan: Physical today 07/06/23.  Mammogram 02/23/23 - Birads I.  Colonoscopy 07/2020 - (Dr Tonna Boehringer) - recommended f/u in 10 years.  PAP 03/23/21 - negative with negative HPV.    Need for influenza vaccination -     Flu vaccine trivalent PF, 6mos and older(Flulaval,Afluria,Fluarix,Fluzone)  Vitiligo Assessment & Plan: Improving with treatment.    Stress Assessment & Plan: Overall appears to be handling things relatively well.  Follow.     Pure hypercholesterolemia Assessment & Plan: On lipitor.  Low cholesterol diet and exercise.  Follow lipid panel and liver function tests.      MGUS (monoclonal gammopathy of unknown significance) Assessment & Plan: Followed by hematology.  Stable.     Lung nodule, solitary Assessment & Plan: Saw Dr Aundria Rud.  Recommended f/u chest CT.  She  is interested in calcium score.     Essential hypertension, benign Assessment & Plan: Continue plendil and triam/hctz.  Blood pressure doing well.  Follow pressures.  Follow metabolic panel.    Essential (hemorrhagic) thrombocythemia (HCC) Assessment & Plan: Being followed by hematology.  Felt to be reactive.    Gastroesophageal reflux disease, unspecified whether esophagitis present Assessment & Plan: On protonix.  No upper symptoms reported.     Encounter for screening for coronary artery disease Assessment & Plan: Discussed with her today regarding screening give family history and risk factors.  Agreeable.  Obtain calcium score (CT).       Dale [place], MD

## 2023-07-06 NOTE — Patient Instructions (Signed)
Calcium score - scheduling - call [phone] press option 4 and then 2 .

## 2023-07-06 NOTE — Assessment & Plan Note (Addendum)
Physical today 07/06/23.  Mammogram 02/23/23 - Birads I.  Colonoscopy 07/2020 - (Dr Tonna Boehringer) - recommended f/u in 10 years.  PAP 03/23/21 - negative with negative HPV.

## 2023-07-07 DIAGNOSIS — Z23 Encounter for immunization: Secondary | ICD-10-CM | POA: Diagnosis not present

## 2023-07-08 ENCOUNTER — Encounter: Payer: Self-pay | Admitting: Internal Medicine

## 2023-07-08 NOTE — Assessment & Plan Note (Signed)
Continue plendil and triam/hctz.  Blood pressure doing well.  Follow pressures.  Follow metabolic panel.  

## 2023-07-08 NOTE — Assessment & Plan Note (Signed)
Being followed by hematology.  Felt to be reactive.

## 2023-07-08 NOTE — Assessment & Plan Note (Signed)
Saw Dr Aundria Rud.  Recommended f/u chest CT.  She is interested in calcium score.

## 2023-07-08 NOTE — Assessment & Plan Note (Signed)
On lipitor.  Low cholesterol diet and exercise.  Follow lipid panel and liver function tests.   

## 2023-07-08 NOTE — Assessment & Plan Note (Signed)
Improving with treatment.  ?

## 2023-07-08 NOTE — Assessment & Plan Note (Signed)
Overall appears to be handling things relatively well.  Follow.   

## 2023-07-08 NOTE — Assessment & Plan Note (Signed)
Discussed with her today regarding screening give family history and risk factors.  Agreeable.  Obtain calcium score (CT).

## 2023-07-08 NOTE — Assessment & Plan Note (Signed)
Followed by hematology. Stable.  

## 2023-07-08 NOTE — Assessment & Plan Note (Signed)
On protonix.  No upper symptoms reported.   

## 2023-07-18 ENCOUNTER — Telehealth: Payer: Self-pay | Admitting: Internal Medicine

## 2023-07-18 DIAGNOSIS — E7800 Pure hypercholesterolemia, unspecified: Secondary | ICD-10-CM

## 2023-07-18 NOTE — Telephone Encounter (Signed)
Patient need lab orders.

## 2023-07-21 NOTE — Addendum Note (Signed)
Addended by: Rita Ohara D on: 07/21/2023 08:53 AM   Modules accepted: Orders

## 2023-07-24 ENCOUNTER — Other Ambulatory Visit (INDEPENDENT_AMBULATORY_CARE_PROVIDER_SITE_OTHER): Payer: BC Managed Care – PPO

## 2023-07-24 DIAGNOSIS — E7800 Pure hypercholesterolemia, unspecified: Secondary | ICD-10-CM | POA: Diagnosis not present

## 2023-07-24 LAB — LIPID PANEL
Cholesterol: 227 mg/dL — ABNORMAL HIGH (ref 0–200)
HDL: 66.8 mg/dL (ref >39.00)
LDL Cholesterol: 146 mg/dL — ABNORMAL HIGH (ref 0–99)
NonHDL: 159.92
Total CHOL/HDL Ratio: 3
Triglycerides: 70 mg/dL (ref 0.0–149.0)
VLDL: 14 mg/dL (ref 0.0–40.0)

## 2023-07-24 LAB — BASIC METABOLIC PANEL
BUN: 8 mg/dL (ref 6–23)
CO2: 28 meq/L (ref 19–32)
Calcium: 9.5 mg/dL (ref 8.4–10.5)
Chloride: 105 meq/L (ref 96–112)
Creatinine, Ser: 0.69 mg/dL (ref 0.40–1.20)
GFR: 95.16 mL/min (ref >60.00)
Glucose, Bld: 93 mg/dL (ref 70–99)
Potassium: 3.9 meq/L (ref 3.5–5.1)
Sodium: 140 meq/L (ref 135–145)

## 2023-07-24 LAB — TSH: TSH: 1.68 u[IU]/mL (ref 0.35–5.50)

## 2023-07-25 ENCOUNTER — Ambulatory Visit
Admission: RE | Admit: 2023-07-25 | Discharge: 2023-07-25 | Disposition: A | Discharge status: Home / Self Care | Payer: BC Managed Care – PPO | Source: Ambulatory Visit | Attending: Internal Medicine | Admitting: Internal Medicine

## 2023-07-25 DIAGNOSIS — Z136 Encounter for screening for cardiovascular disorders: Secondary | ICD-10-CM | POA: Insufficient documentation

## 2023-08-17 ENCOUNTER — Other Ambulatory Visit: Payer: Self-pay | Admitting: Internal Medicine

## 2023-08-28 ENCOUNTER — Encounter: Payer: Self-pay | Admitting: Internal Medicine

## 2023-09-21 ENCOUNTER — Ambulatory Visit: Payer: BC Managed Care – PPO | Admitting: Dermatology

## 2023-09-28 ENCOUNTER — Ambulatory Visit: Payer: 59 | Admitting: Dermatology

## 2023-09-28 ENCOUNTER — Encounter: Payer: Self-pay | Admitting: Dermatology

## 2023-09-28 ENCOUNTER — Encounter: Payer: Self-pay | Admitting: Internal Medicine

## 2023-09-28 DIAGNOSIS — L80 Vitiligo: Secondary | ICD-10-CM

## 2023-09-28 MED ORDER — OPZELURA 1.5 % EX CREA: 1.0000 | TOPICAL_CREAM | Freq: Two times a day (BID) | CUTANEOUS | 6 refills | Status: DC

## 2023-09-28 NOTE — Progress Notes (Signed)
   Follow-Up Visit   Subjective  Kathleen Gould is a 58 y.o. female who presents for the following: vitiligo follow up of the L elbow, chest, and R wrists pt currently using Opzelura cream BID and has seen improvement.   The following portions of the chart were reviewed this encounter and updated as appropriate: medications, allergies, medical history  Review of Systems:  No other skin or systemic complaints except as noted in HPI or Assessment and Plan.  Objective  Well appearing patient in no apparent distress; mood and affect are within normal limits.  A focused examination was performed of the following areas: The face, elbows, chest, and wrists  Relevant physical exam findings are noted in the assessment and plan.    Assessment & Plan   VITILIGO   VITILIGO - reviewed TSH, WNL         Exam: depigmented patches on the L elbow, chest, and R wrist, all improved with repigmentation  Vitiligo is a chronic autoimmune condition which causes loss of skin pigment and is commonly seen on the face and may also involve areas of trauma like hands, elbows, knees, and ankles. There is no cure and it is difficult to treat.  Treatments include topical steroids and other topical anti-inflammatory ointments/creams and topical and oral Jak inhibitors.  Sometimes narrow band UV light therapy or Xtrac laser is helpful, both of which require twice weekly treatments for at least 3-6 months.  Antioxidant vitamins, such as Vitamins C,E,D, Folic Acid, B12, and alpha lipoic acid may be added to enhance treatment. Heliocare may also enhance treatment results.   Treatment Plan: Continue Opzelura cream BID.  Return in about 6 months (around 03/27/2024) for vitiligo follow up.  Maylene Roes, CMA, am acting as scribe for Elie Goody, MD .   Documentation: I have reviewed the above documentation for accuracy and completeness, and I agree with the above.  Elie Goody, MD

## 2023-09-28 NOTE — Patient Instructions (Signed)

## 2023-11-03 ENCOUNTER — Ambulatory Visit: Payer: BC Managed Care – PPO | Admitting: Internal Medicine

## 2023-11-24 ENCOUNTER — Other Ambulatory Visit: Payer: Self-pay | Admitting: Internal Medicine

## 2023-12-16 ENCOUNTER — Other Ambulatory Visit: Payer: Self-pay | Admitting: Internal Medicine

## 2023-12-28 ENCOUNTER — Ambulatory Visit: Payer: BC Managed Care – PPO | Admitting: Dermatology

## 2024-01-09 ENCOUNTER — Encounter: Payer: Self-pay | Admitting: Internal Medicine

## 2024-01-09 ENCOUNTER — Ambulatory Visit: Admitting: Internal Medicine

## 2024-01-09 VITALS — BP 128/76 | HR 85 | Temp 98.9°F | Ht 65.5 in | Wt 221.2 lb

## 2024-01-09 DIAGNOSIS — E7800 Pure hypercholesterolemia, unspecified: Secondary | ICD-10-CM

## 2024-01-09 DIAGNOSIS — Z1231 Encounter for screening mammogram for malignant neoplasm of breast: Secondary | ICD-10-CM | POA: Diagnosis not present

## 2024-01-09 DIAGNOSIS — I10 Essential (primary) hypertension: Secondary | ICD-10-CM | POA: Diagnosis not present

## 2024-01-09 DIAGNOSIS — D472 Monoclonal gammopathy: Secondary | ICD-10-CM

## 2024-01-09 DIAGNOSIS — L80 Vitiligo: Secondary | ICD-10-CM | POA: Diagnosis not present

## 2024-01-09 DIAGNOSIS — F439 Reaction to severe stress, unspecified: Secondary | ICD-10-CM

## 2024-01-09 DIAGNOSIS — D473 Essential (hemorrhagic) thrombocythemia: Secondary | ICD-10-CM

## 2024-01-09 NOTE — Progress Notes (Signed)
 Subjective:    Patient ID: Kathleen Gould, female    DOB: Nov 10, 1965, 58 y.o.   MRN: [id number]  Patient here for  Chief Complaint  Patient presents with   Medical Management of Chronic Issues    4 month follow-up    HPI Here for a scheduled follow up - follow up regarding hypercholesterolemia and hypertension. Working at [employer]. Seeing dermatology for f/u vitiligo. Has improved. Continues on opzelura  cream. Was seen acute care - right ankle injury. Was given prednisone  and hydrocodone . Instructed to ice. Placed in "boot". Is better. Overall appears to be handling stress relatively well. Work stress. No chest pain or sob reported. No abdominal pain or bowel change reported.    Past Medical History:  Diagnosis Date   Arthritis    Endometriosis    Environmental allergies    GERD (gastroesophageal reflux disease)    Hypercholesterolemia    Hypertension    Uterine fibroid    Past Surgical History:  Procedure Laterality Date   ABDOMINAL HYSTERECTOMY     CESAREAN SECTION     x2   OOPHORECTOMY     S/P left secondary to cyst   THYROGLOSSAL DUCT CYST N/A 05/27/2015   Procedure: THYROGLOSSAL DUCT CYST EXCISION ;  Surgeon: Von Grumbling, MD;  Location: [facility] ORS;  Service: ENT;  Laterality: N/A;   Family History  Problem Relation Age of Onset   Heart attack Father 54   Heart disease Father        bypass surgery   Hypertension Father    Hypercholesterolemia Father    Kidney disease Father    Diabetes Mother    Diabetes Other        aunt   Breast cancer Neg Hx    Social History   Socioeconomic History   Marital status: Married    Spouse name: Not on file   Number of children: 2   Years of education: Not on file   Highest education level: Not on file  Occupational History   Not on file  Tobacco Use   Smoking status: Never   Smokeless tobacco: Never  Vaping Use   Vaping status: Never Used  Substance and Sexual Activity   Alcohol use: No     Alcohol/week: 0.0 standard drinks of alcohol   Drug use: No   Sexual activity: Not Currently  Other Topics Concern   Not on file  Social History Narrative   Widowed    Social Drivers of Corporate investment banker Strain: Not on file  Food Insecurity: Not on file  Transportation Needs: Not on file  Physical Activity: Not on file  Stress: Not on file  Social Connections: Not on file     Review of Systems  Constitutional:  Negative for appetite change and unexpected weight change.  HENT:  Negative for congestion and sinus pressure.   Respiratory:  Negative for cough, chest tightness and shortness of breath.   Cardiovascular:  Negative for chest pain, palpitations and leg swelling.  Gastrointestinal:  Negative for abdominal pain, diarrhea, nausea and vomiting.  Genitourinary:  Negative for difficulty urinating and dysuria.  Musculoskeletal:  Negative for joint swelling and myalgias.  Skin:  Negative for rash and wound.  Neurological:  Negative for dizziness and headaches.  Psychiatric/Behavioral:  Negative for agitation and dysphoric mood.        Objective:     BP 128/76   Pulse 85   Temp 98.9 F (37.2 C) (Oral)   Ht  5' 5.5" (1.664 m)   Wt 221 lb 3.2 oz (100.3 kg)   LMP 11/06/2000   SpO2 97%   BMI 36.25 kg/m  Wt Readings from Last 3 Encounters:  01/09/24 221 lb 3.2 oz (100.3 kg)  07/06/23 210 lb (95.3 kg)  04/28/23 208 lb 9.6 oz (94.6 kg)    Physical Exam Vitals reviewed.  Constitutional:      General: She is not in acute distress.    Appearance: Normal appearance.  HENT:     Head: Normocephalic and atraumatic.     Right Ear: External ear normal.     Left Ear: External ear normal.     Mouth/Throat:     Pharynx: No oropharyngeal exudate or posterior oropharyngeal erythema.  Eyes:     General: No scleral icterus.       Right eye: No discharge.        Left eye: No discharge.     Conjunctiva/sclera: Conjunctivae normal.  Neck:     Thyroid : No thyromegaly.   Cardiovascular:     Rate and Rhythm: Normal rate and regular rhythm.  Pulmonary:     Effort: No respiratory distress.     Breath sounds: Normal breath sounds. No wheezing.  Abdominal:     General: Bowel sounds are normal.     Palpations: Abdomen is soft.     Tenderness: There is no abdominal tenderness.  Musculoskeletal:        General: No swelling or tenderness.     Cervical back: Neck supple. No tenderness.  Lymphadenopathy:     Cervical: No cervical adenopathy.  Skin:    Findings: No erythema or rash.  Neurological:     Mental Status: She is alert.  Psychiatric:        Mood and Affect: Mood normal.        Behavior: Behavior normal.         Outpatient Encounter Medications as of 01/09/2024  Medication Sig   albuterol  (VENTOLIN  HFA) 108 (90 Base) MCG/ACT inhaler Inhale 2 puffs into the lungs every 4 (four) hours as needed.   atorvastatin  (LIPITOR) 20 MG tablet Take 1 tablet (20 mg total) by mouth daily.   felodipine  (PLENDIL ) 5 MG 24 hr tablet TAKE 1 TABLET (5 MG TOTAL) BY MOUTH DAILY.   fluticasone  (FLONASE ) 50 MCG/ACT nasal spray SPRAY 2 SPRAYS INTO EACH NOSTRIL EVERY DAY   pantoprazole  (PROTONIX ) 40 MG tablet TAKE 1 TABLET BY MOUTH EVERY DAY   Ruxolitinib Phosphate  (OPZELURA ) 1.5 % CREA Apply 1 Application topically 2 (two) times daily. bid to aa arms, chest for vitiligo   triamterene -hydrochlorothiazide (MAXZIDE-25) 37.5-25 MG tablet TAKE 1/2 TABLET BY MOUTH DAILY   No facility-administered encounter medications on file as of 01/09/2024.     Lab Results  Component Value Date   WBC 5.9 04/03/2023   HGB 13.2 04/03/2023   HCT 39.7 04/03/2023   PLT 379 04/03/2023   GLUCOSE 93 07/24/2023   CHOL 227 (H) 07/24/2023   TRIG 70.0 07/24/2023   HDL 66.80 07/24/2023   LDLDIRECT 164.5 07/24/2013   LDLCALC 146 (H) 07/24/2023   ALT 18 04/03/2023   AST 18 04/03/2023   NA 140 07/24/2023   K 3.9 07/24/2023   CL 105 07/24/2023   CREATININE 0.69 07/24/2023   BUN 8 07/24/2023    CO2 28 07/24/2023   TSH 1.68 07/24/2023   INR 1.0 04/21/2014    CT CARDIAC SCORING Addendum Date: 08/07/2023 ADDENDUM REPORT: 08/07/2023 21:08 EXAM: OVER-READ INTERPRETATION  CT CHEST  The following report is an over-read performed by radiologist Dr. Cyndia Drape [facility] on 08/07/2023. This over-read does not include interpretation of cardiac or coronary anatomy or pathology. The coronary calcium  score interpretation by the cardiologist is attached. COMPARISON:  02/15/2017. FINDINGS: Cardiovascular:  See findings discussed in the body of the report. Mediastinum/Nodes: No suspicious adenopathy identified. Imaged mediastinal structures are unremarkable. Lungs/Pleura: Imaged lungs are clear. No pleural effusion or pneumothorax. Upper Abdomen: No acute abnormality. Musculoskeletal: No chest wall abnormality. No acute osseous findings. IMPRESSION: No acute extracardiac incidental findings. Electronically Signed   By: Sydell Eva M.D.   On: 08/07/2023 21:08   Result Date: 08/07/2023 CLINICAL DATA:  Risk stratification EXAM: Coronary Calcium  Score TECHNIQUE: The patient was scanned on a Siemens Somatom scanner. Axial non-contrast 3 mm slices were carried out through the heart. The data set was analyzed on a dedicated work station and scored using the Agatson method. FINDINGS: Non-cardiac: See separate report from [facility]. Ascending Aorta: Normal size Pericardium: Normal Coronary arteries: Normal origin of left and right coronary arteries. Distribution of arterial calcifications if present, as noted below; LM 0 LAD 40.3 LCx 0 RCA 0 Total 40.3 IMPRESSION AND RECOMMENDATION: 1. Coronary calcium  score of 40.3. This was 84th percentile for age and sex matched control. 2. CAC 1-99 in LAD. CAC-DRS A1/N1. 3. Consider statin due to percentile >75. 4. Continue heart healthy lifestyle and risk factor modification. Electronically Signed: By: Constancia Delton M.D. On: 07/25/2023 10:37        Assessment & Plan:  Essential hypertension, benign -     Basic metabolic panel with GFR; Future  Pure hypercholesterolemia Assessment & Plan: Continue lipitor. Low cholesterol diet and exercise. Follow iipid panel.   Orders: -     Hepatic function panel; Future -     Lipid panel; Future  Encounter for screening mammogram for malignant neoplasm of breast -     3D Screening Mammogram, Left and Right; Future  Vitiligo Assessment & Plan: Has improved. Seeing dermatology. Continues on opzelura  cream.   Stress Assessment & Plan: Overall appears to be handling things relatively well. Notify if feels needs further intervention.    MGUS (monoclonal gammopathy of unknown significance) Assessment & Plan: Followed by hematology. Stable.    Essential (hemorrhagic) thrombocythemia (HCC) Assessment & Plan: Being followed by hematology. Felt to be reactive.       Dellar Fenton, MD

## 2024-01-15 ENCOUNTER — Encounter: Payer: Self-pay | Admitting: Internal Medicine

## 2024-01-15 NOTE — Assessment & Plan Note (Signed)
 Continue lipitor. Low cholesterol diet and exercise. Follow iipid panel.

## 2024-01-15 NOTE — Assessment & Plan Note (Signed)
Being followed by hematology.  Felt to be reactive.

## 2024-01-15 NOTE — Assessment & Plan Note (Signed)
Followed by hematology. Stable.  

## 2024-01-15 NOTE — Assessment & Plan Note (Signed)
 Has improved. Seeing dermatology. Continues on opzelura  cream.

## 2024-01-15 NOTE — Assessment & Plan Note (Signed)
 Overall appears to be handling things relatively well. Notify if feels needs further intervention.

## 2024-01-30 ENCOUNTER — Other Ambulatory Visit (INDEPENDENT_AMBULATORY_CARE_PROVIDER_SITE_OTHER)

## 2024-01-30 DIAGNOSIS — I10 Essential (primary) hypertension: Secondary | ICD-10-CM | POA: Diagnosis not present

## 2024-01-30 DIAGNOSIS — E7800 Pure hypercholesterolemia, unspecified: Secondary | ICD-10-CM

## 2024-01-30 LAB — LIPID PANEL
Cholesterol: 220 mg/dL — ABNORMAL HIGH (ref 0–200)
HDL: 80.2 mg/dL (ref >39.00)
LDL Cholesterol: 127 mg/dL — ABNORMAL HIGH (ref 0–99)
NonHDL: 140.01
Total CHOL/HDL Ratio: 3
Triglycerides: 66 mg/dL (ref 0.0–149.0)
VLDL: 13.2 mg/dL (ref 0.0–40.0)

## 2024-01-30 LAB — HEPATIC FUNCTION PANEL
ALT: 21 U/L (ref 0–35)
AST: 17 U/L (ref 0–37)
Albumin: 4.3 g/dL (ref 3.5–5.2)
Alkaline Phosphatase: 70 U/L (ref 39–117)
Bilirubin, Direct: 0.2 mg/dL (ref 0.0–0.3)
Total Bilirubin: 1 mg/dL (ref 0.2–1.2)
Total Protein: 7.7 g/dL (ref 6.0–8.3)

## 2024-01-30 LAB — BASIC METABOLIC PANEL WITH GFR
BUN: 9 mg/dL (ref 6–23)
CO2: 31 meq/L (ref 19–32)
Calcium: 10 mg/dL (ref 8.4–10.5)
Chloride: 101 meq/L (ref 96–112)
Creatinine, Ser: 0.72 mg/dL (ref 0.40–1.20)
GFR: 91.34 mL/min (ref >60.00)
Glucose, Bld: 94 mg/dL (ref 70–99)
Potassium: 3.9 meq/L (ref 3.5–5.1)
Sodium: 138 meq/L (ref 135–145)

## 2024-02-01 ENCOUNTER — Ambulatory Visit: Payer: Self-pay | Admitting: Internal Medicine

## 2024-02-26 ENCOUNTER — Encounter

## 2024-03-12 ENCOUNTER — Ambulatory Visit
Admission: RE | Admit: 2024-03-12 | Discharge: 2024-03-12 | Disposition: A | Discharge status: Home / Self Care | Source: Ambulatory Visit | Attending: Internal Medicine | Admitting: Internal Medicine

## 2024-03-12 DIAGNOSIS — Z1231 Encounter for screening mammogram for malignant neoplasm of breast: Secondary | ICD-10-CM | POA: Diagnosis present

## 2024-03-16 ENCOUNTER — Other Ambulatory Visit: Payer: Self-pay | Admitting: Internal Medicine

## 2024-03-17 ENCOUNTER — Other Ambulatory Visit: Payer: Self-pay | Admitting: Internal Medicine

## 2024-03-26 ENCOUNTER — Ambulatory Visit: Payer: 59 | Admitting: Dermatology

## 2024-03-26 ENCOUNTER — Encounter: Payer: Self-pay | Admitting: Dermatology

## 2024-03-26 DIAGNOSIS — Z79899 Other long term (current) drug therapy: Secondary | ICD-10-CM

## 2024-03-26 DIAGNOSIS — S80862A Insect bite (nonvenomous), left lower leg, initial encounter: Secondary | ICD-10-CM | POA: Diagnosis not present

## 2024-03-26 DIAGNOSIS — S80861A Insect bite (nonvenomous), right lower leg, initial encounter: Secondary | ICD-10-CM

## 2024-03-26 DIAGNOSIS — L80 Vitiligo: Secondary | ICD-10-CM

## 2024-03-26 DIAGNOSIS — S90861A Insect bite (nonvenomous), right foot, initial encounter: Secondary | ICD-10-CM

## 2024-03-26 DIAGNOSIS — Z7189 Other specified counseling: Secondary | ICD-10-CM

## 2024-03-26 DIAGNOSIS — W57XXXA Bitten or stung by nonvenomous insect and other nonvenomous arthropods, initial encounter: Secondary | ICD-10-CM

## 2024-03-26 DIAGNOSIS — S90862A Insect bite (nonvenomous), left foot, initial encounter: Secondary | ICD-10-CM

## 2024-03-26 MED ORDER — OPZELURA 1.5 % EX CREA: 1.0000 | TOPICAL_CREAM | Freq: Two times a day (BID) | CUTANEOUS | 6 refills | Status: AC

## 2024-03-26 NOTE — Patient Instructions (Signed)

## 2024-03-26 NOTE — Progress Notes (Signed)
   Follow-Up Visit   Subjective  Kathleen Gould is a 58 y.o. female who presents for the following: vitiligo follow up of the L elbow, chest, and R wrist, currently using Opzelura  cream BID, and has cleared all other areas except the R wrist.   The following portions of the chart were reviewed this encounter and updated as appropriate: medications, allergies, medical history  Review of Systems:  No other skin or systemic complaints except as noted in HPI or Assessment and Plan.  Objective  Well appearing patient in no apparent distress; mood and affect are within normal limits.  A focused examination was performed of the following areas: The face, elbows, chest, and wrists  Relevant physical exam findings are noted in the assessment and plan.    Assessment & Plan   VITILIGO   Related Medications Ruxolitinib Phosphate  (OPZELURA ) 1.5 % CREA Apply 1 Application topically 2 (two) times daily. bid to aa arms, chest for vitiligo COUNSELING AND COORDINATION OF CARE   MEDICATION MANAGEMENT   INSECT BITE, INITIAL ENCOUNTER   VITILIGO - reviewed TSH 11/24, WNL. Improving with opzelura     Exam: Depigmented patches R wrist with some repigmentation. Chest and L elbow patches re-pigmented.   Vitiligo is a chronic autoimmune condition which causes loss of skin pigment and is commonly seen on the face and may also involve areas of trauma like hands, elbows, knees, and ankles. There is no cure and it is difficult to treat.  Treatments include topical steroids and other topical anti-inflammatory ointments/creams and topical and oral Jak inhibitors.  Sometimes narrow band UV light therapy or Xtrac laser is helpful, both of which require twice weekly treatments for at least 3-6 months.  Antioxidant vitamins, such as Vitamins C,E,D, Folic Acid, B12, and alpha lipoic acid may be added to enhance treatment. Heliocare may also enhance treatment results.   Treatment Plan: Continue Opzelura   cream BID. Recheck thyroid  levels at PCP appointment 05/16/24  Insect bite reaction  Exam: Lower legs and feet erythematous papules and pustules.  Plan: Samples given of Imployz, apply to aa's BID until calmed. Topical steroids (such as triamcinolone, fluocinolone, fluocinonide, mometasone, clobetasol, halobetasol, betamethasone, hydrocortisone) can cause thinning and lightening of the skin if they are used for too long in the same area. Your physician has selected the right strength medicine for your problem and area affected on the body. Please use your medication only as directed by your physician to prevent side effects.   Return in about 1 year (around 03/26/2025) for vitiligo appointment and Opzelura  refills.  Kathleen Gould, Kathleen Gould, am acting as scribe for Boneta Sharps, MD .   Documentation: I have reviewed the above documentation for accuracy and completeness, and I agree with the above.  Boneta Sharps, MD

## 2024-04-08 ENCOUNTER — Other Ambulatory Visit: Payer: Self-pay | Admitting: Internal Medicine

## 2024-04-26 ENCOUNTER — Inpatient Hospital Stay: Payer: BC Managed Care – PPO | Attending: Internal Medicine

## 2024-04-26 DIAGNOSIS — D472 Monoclonal gammopathy: Secondary | ICD-10-CM | POA: Diagnosis present

## 2024-04-26 LAB — CMP (CANCER CENTER ONLY)
ALT: 20 U/L (ref 0–44)
AST: 20 U/L (ref 15–41)
Albumin: 3.9 g/dL (ref 3.5–5.0)
Alkaline Phosphatase: 72 U/L (ref 38–126)
Anion gap: 9 (ref 5–15)
BUN: 13 mg/dL (ref 6–20)
CO2: 26 mmol/L (ref 22–32)
Calcium: 9.8 mg/dL (ref 8.9–10.3)
Chloride: 104 mmol/L (ref 98–111)
Creatinine: 0.72 mg/dL (ref 0.44–1.00)
GFR, Estimated: >60 mL/min (ref >60)
Glucose, Bld: 110 mg/dL — ABNORMAL HIGH (ref 70–99)
Potassium: 3.9 mmol/L (ref 3.5–5.1)
Sodium: 139 mmol/L (ref 135–145)
Total Bilirubin: 0.3 mg/dL (ref 0.0–1.2)
Total Protein: 7.6 g/dL (ref 6.5–8.1)

## 2024-04-26 LAB — CBC WITH DIFFERENTIAL (CANCER CENTER ONLY)
Abs Immature Granulocytes: 0.02 K/uL (ref 0.00–0.07)
Basophils Absolute: 0 K/uL (ref 0.0–0.1)
Basophils Relative: 0 %
Eosinophils Absolute: 0.3 K/uL (ref 0.0–0.5)
Eosinophils Relative: 4 %
HCT: 39.6 % (ref 36.0–46.0)
Hemoglobin: 13 g/dL (ref 12.0–15.0)
Immature Granulocytes: 0 %
Lymphocytes Relative: 40 %
Lymphs Abs: 2.9 K/uL (ref 0.7–4.0)
MCH: 28.6 pg (ref 26.0–34.0)
MCHC: 32.8 g/dL (ref 30.0–36.0)
MCV: 87 fL (ref 80.0–100.0)
Monocytes Absolute: 0.5 K/uL (ref 0.1–1.0)
Monocytes Relative: 6 %
Neutro Abs: 3.6 K/uL (ref 1.7–7.7)
Neutrophils Relative %: 50 %
Platelet Count: 449 K/uL — ABNORMAL HIGH (ref 150–400)
RBC: 4.55 MIL/uL (ref 3.87–5.11)
RDW: 13.8 % (ref 11.5–15.5)
WBC Count: 7.2 K/uL (ref 4.0–10.5)
nRBC: 0 % (ref 0.0–0.2)

## 2024-04-26 LAB — FERRITIN: Ferritin: 71 ng/mL (ref 11–307)

## 2024-04-26 LAB — IRON AND TIBC
Iron: 57 ug/dL (ref 28–170)
Saturation Ratios: 18 % (ref 10.4–31.8)
TIBC: 325 ug/dL (ref 250–450)
UIBC: 268 ug/dL

## 2024-04-30 LAB — KAPPA/LAMBDA LIGHT CHAINS
Kappa free light chain: 18.7 mg/L (ref 3.3–19.4)
Kappa, lambda light chain ratio: 1.41 (ref 0.26–1.65)
Lambda free light chains: 13.3 mg/L (ref 5.7–26.3)

## 2024-05-01 LAB — MULTIPLE MYELOMA PANEL, SERUM
Albumin SerPl Elph-Mcnc: 3.8 g/dL (ref 2.9–4.4)
Albumin/Glob SerPl: 1.1 (ref 0.7–1.7)
Alpha 1: 0.3 g/dL (ref 0.0–0.4)
Alpha2 Glob SerPl Elph-Mcnc: 0.8 g/dL (ref 0.4–1.0)
B-Globulin SerPl Elph-Mcnc: 1.3 g/dL (ref 0.7–1.3)
Gamma Glob SerPl Elph-Mcnc: 1.2 g/dL (ref 0.4–1.8)
Globulin, Total: 3.5 g/dL (ref 2.2–3.9)
IgA: 282 mg/dL (ref 87–352)
IgG (Immunoglobin G), Serum: 1239 mg/dL (ref 586–1602)
IgM (Immunoglobulin M), Srm: 112 mg/dL (ref 26–217)
M Protein SerPl Elph-Mcnc: 0.4 g/dL — ABNORMAL HIGH
Total Protein ELP: 7.3 g/dL (ref 6.0–8.5)

## 2024-05-10 ENCOUNTER — Inpatient Hospital Stay: Attending: Internal Medicine | Admitting: Internal Medicine

## 2024-05-10 ENCOUNTER — Encounter: Payer: Self-pay | Admitting: Internal Medicine

## 2024-05-10 ENCOUNTER — Inpatient Hospital Stay: Payer: BC Managed Care – PPO | Admitting: Internal Medicine

## 2024-05-10 VITALS — BP 113/55 | HR 87 | Temp 98.2°F | Resp 16 | Ht 65.5 in | Wt 218.9 lb

## 2024-05-10 DIAGNOSIS — D472 Monoclonal gammopathy: Secondary | ICD-10-CM | POA: Diagnosis present

## 2024-05-10 DIAGNOSIS — D75839 Thrombocytosis, unspecified: Secondary | ICD-10-CM | POA: Diagnosis not present

## 2024-05-10 NOTE — Progress Notes (Signed)
 Concerned with her platelet levels.

## 2024-05-10 NOTE — Progress Notes (Signed)
 [facility] OFFICE PROGRESS NOTE  Patient Care Team: Glendia Shad, MD as PCP - General (Internal Medicine) Aundria Ladell POUR, MD as Consulting Physician (Gastroenterology) Kip Rosaline JAYSON DEVONNA as Physician Assistant Rennie Kathleen SAUNDERS, MD as Consulting Physician (Oncology)   Cancer Staging  No matching staging information was found for the patient.   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 Serum LDH, Cr, CBC, Flow Cytometry, Skeletal survey all unremarkable.   # RML lung nodule 4mm [June 2018; incidental]  Oncology History   No history exists.     INTERVAL HISTORY: Patient ambulating-independently.  Alone.  Kathleen Gould 58 y.o.  female pleasant patient above history of MGUS; and iron  deficiency is here for follow-up.   Patient denies any tingling and numbness. Denies any weight loss or unusual bone pain. No nausea no vomiting no weight loss.  Patient denies any unusual shortness of breath or cough.  Review of Systems  Constitutional:  Negative for chills, diaphoresis, fever, malaise/fatigue and weight loss.  HENT:  Negative for nosebleeds and sore throat.   Eyes:  Negative for double vision.  Respiratory:  Negative for cough, hemoptysis, sputum production, shortness of breath and wheezing.   Cardiovascular:  Negative for chest pain, palpitations, orthopnea and leg swelling.  Gastrointestinal:  Negative for abdominal pain, blood in stool, constipation, diarrhea, heartburn, melena, nausea and vomiting.  Genitourinary:  Negative for dysuria, frequency and urgency.  Musculoskeletal:  Positive for joint pain. Negative for back pain.  Skin: Negative.  Negative for itching and rash.  Neurological:  Negative for dizziness, tingling, focal weakness, weakness and headaches.  Endo/Heme/Allergies:  Does not bruise/bleed easily.   Psychiatric/Behavioral:  Negative for depression. The patient is not nervous/anxious and does not have insomnia.       PAST MEDICAL HISTORY :  Past Medical History:  Diagnosis Date   Arthritis    Endometriosis    Environmental allergies    GERD (gastroesophageal reflux disease)    Hypercholesterolemia    Hypertension    Uterine fibroid     PAST SURGICAL HISTORY :   Past Surgical History:  Procedure Laterality Date   ABDOMINAL HYSTERECTOMY     CESAREAN SECTION     x2   OOPHORECTOMY     S/P left secondary to cyst   THYROGLOSSAL DUCT CYST N/A 05/27/2015   Procedure: THYROGLOSSAL DUCT CYST EXCISION ;  Surgeon: Deward Dolly, MD;  Location: [facility] ORS;  Service: ENT;  Laterality: N/A;    FAMILY HISTORY :   Family History  Problem Relation Age of Onset   Heart attack Father 65   Heart disease Father        bypass surgery   Hypertension Father    Hypercholesterolemia Father    Kidney disease Father    Diabetes Mother    Diabetes Other        aunt   Breast cancer Neg Hx     SOCIAL HISTORY:   Social History   Tobacco Use   Smoking status: Never   Smokeless tobacco: Never  Vaping Use   Vaping status: Never Used  Substance Use Topics   Alcohol use: No    Alcohol/week: 0.0 standard drinks of alcohol   Drug use: No    ALLERGIES:  is allergic to cefdinir, levofloxacin, and simvastatin.  MEDICATIONS:  Current Outpatient Medications  Medication Sig Dispense Refill   albuterol  (VENTOLIN  HFA) 108 (90  Base) MCG/ACT inhaler Inhale 2 puffs into the lungs every 4 (four) hours as needed. 18 g 1   atorvastatin  (LIPITOR) 20 MG tablet TAKE 1 TABLET BY MOUTH EVERY DAY 90 tablet 3   felodipine  (PLENDIL ) 5 MG 24 hr tablet TAKE 1 TABLET (5 MG TOTAL) BY MOUTH DAILY. 90 tablet 0   fluticasone  (FLONASE ) 50 MCG/ACT nasal spray SPRAY 2 SPRAYS INTO EACH NOSTRIL EVERY DAY 48 mL 0   pantoprazole  (PROTONIX ) 40 MG tablet TAKE 1 TABLET BY MOUTH EVERY DAY 90 tablet 1   Ruxolitinib Phosphate   (OPZELURA ) 1.5 % CREA Apply 1 Application topically 2 (two) times daily. bid to aa arms, chest for vitiligo 60 g 6   triamterene -hydrochlorothiazide (MAXZIDE-25) 37.5-25 MG tablet TAKE 1/2 TABLET BY MOUTH DAILY 45 tablet 1   No current facility-administered medications for this visit.    PHYSICAL EXAMINATION: ECOG PERFORMANCE STATUS: 0 - Asymptomatic  BP (!) 113/55 (BP Location: Left Arm, Patient Position: Sitting, Cuff Size: Large)   Pulse 87   Temp 98.2 F (36.8 C) (Tympanic)   Resp 16   Ht 5' 5.5 (1.664 m)   Wt 218 lb 14.4 oz (99.3 kg)   LMP 11/06/2000   SpO2 98%   BMI 35.87 kg/m   Filed Weights   05/10/24 1458  Weight: 218 lb 14.4 oz (99.3 kg)    Physical Exam Vitals and nursing note reviewed.  HENT:     Head: Normocephalic and atraumatic.     Mouth/Throat:     Pharynx: Oropharynx is clear.  Eyes:     Extraocular Movements: Extraocular movements intact.     Pupils: Pupils are equal, round, and reactive to light.  Cardiovascular:     Rate and Rhythm: Normal rate and regular rhythm.  Pulmonary:     Comments: Decreased breath sounds bilaterally.  Abdominal:     Palpations: Abdomen is soft.  Musculoskeletal:        General: Normal range of motion.     Cervical back: Normal range of motion.  Skin:    General: Skin is warm.  Neurological:     General: No focal deficit present.     Mental Status: She is alert and oriented to person, place, and time.  Psychiatric:        Behavior: Behavior normal.        Judgment: Judgment normal.      LABORATORY DATA:  I have reviewed the data as listed    Component Value Date/Time   NA 139 04/26/2024 1526   K 3.9 04/26/2024 1526   K 3.9 05/27/2015 0615   CL 104 04/26/2024 1526   CO2 26 04/26/2024 1526   GLUCOSE 110 (H) 04/26/2024 1526   BUN 13 04/26/2024 1526   CREATININE 0.72 04/26/2024 1526   CREATININE 0.81 04/21/2014 1629   CALCIUM  9.8 04/26/2024 1526   CALCIUM  9.3 04/21/2014 1629   PROT 7.6 04/26/2024 1526    ALBUMIN 3.9 04/26/2024 1526   AST 20 04/26/2024 1526   ALT 20 04/26/2024 1526   ALKPHOS 72 04/26/2024 1526   BILITOT 0.3 04/26/2024 1526   GFRNONAA >60 04/26/2024 1526   GFRNONAA >60 04/21/2014 1629   GFRAA >60 01/08/2020 1547   GFRAA >60 04/21/2014 1629    No results found for: SPEP, UPEP  Lab Results  Component Value Date   WBC 7.2 04/26/2024   NEUTROABS 3.6 04/26/2024   HGB 13.0 04/26/2024   HCT 39.6 04/26/2024   MCV 87.0 04/26/2024   PLT 449 (H) 04/26/2024  Chemistry      Component Value Date/Time   NA 139 04/26/2024 1526   K 3.9 04/26/2024 1526   K 3.9 05/27/2015 0615   CL 104 04/26/2024 1526   CO2 26 04/26/2024 1526   BUN 13 04/26/2024 1526   CREATININE 0.72 04/26/2024 1526   CREATININE 0.81 04/21/2014 1629      Component Value Date/Time   CALCIUM  9.8 04/26/2024 1526   CALCIUM  9.3 04/21/2014 1629   ALKPHOS 72 04/26/2024 1526   AST 20 04/26/2024 1526   ALT 20 04/26/2024 1526   BILITOT 0.3 04/26/2024 1526      RADIOGRAPHIC STUDIES: I have personally reviewed the radiological images as listed and agreed with the findings in the report. No results found.   ASSESSMENT & PLAN:  MGUS (monoclonal gammopathy of unknown significance) Asymptomatic: [since 2018] Patient's M protein in March 2022 0.4g/dL normal kappa Lambda light chain ratio. CBC CMP today are normal. AUG 2024- 0.3 gm/dl; today Myeloma panel/ lambda light chain monoclonal protein= wnl;   #I again had a long conversation with the patient regarding the pathophysiology of MGUS/small risk of turning into multiple myeloma about 1 %/year. Continue annual surveillance.   # chronic intermittent thrombocytosis- will order MPN work up next visit- ordered.   # Vitiligo- [Dr.Kowalski]- s/p opselera.   # Iron  def- ? NOV 2023- ferritin-10; iron  sat- colonoscopy- 2021-[facility] [Dr.Sakai]; no re: EGD-OFF iron - I sat 32%; ferritin > 100. Monitor for now.   # DISPOSITION: # ordered JAK-2/CALR/MPL- time them for  next visit in 12 months # follow up in 12  months- MD; 2 week prior- labs- cbc/cmp;MM panel; k/l light chains; iron  studies/ferritin-- Dr.B    Orders Placed This Encounter  Procedures   JAK2 genotypr    Standing Status:   Future    Expected Date:   04/25/2025    Expiration Date:   05/10/2025   Calreticulin (CALR) Mutation Analysis    Standing Status:   Future    Expected Date:   04/25/2025    Expiration Date:   05/10/2025   MPL mutation analysis    Standing Status:   Future    Expected Date:   04/25/2025    Expiration Date:   05/10/2025   CBC with Differential (Cancer Center Only)    Standing Status:   Future    Expected Date:   04/25/2025    Expiration Date:   05/10/2025   CMP (Cancer Center only)    Standing Status:   Future    Expected Date:   04/25/2025    Expiration Date:   05/10/2025   Multiple Myeloma Panel (SPEP&IFE w/QIG)    Standing Status:   Future    Expected Date:   04/25/2025    Expiration Date:   05/10/2025   Kappa/lambda light chains    Standing Status:   Future    Expected Date:   04/25/2025    Expiration Date:   05/10/2025   Iron  and TIBC    Standing Status:   Future    Expected Date:   04/25/2025    Expiration Date:   05/10/2025   Ferritin    Standing Status:   Future    Expected Date:   04/25/2025    Expiration Date:   05/10/2025   All questions were answered. The patient knows to call the clinic with any problems, questions or concerns.      Kathleen JONELLE Joe, MD 05/10/2024 3:38 PM

## 2024-05-10 NOTE — Assessment & Plan Note (Addendum)
 Asymptomatic: [since 2018] Patient's M protein in March 2022 0.4g/dL normal kappa Lambda light chain ratio. CBC CMP today are normal. AUG 2024- 0.3 gm/dl; today Myeloma panel/ lambda light chain monoclonal protein= wnl;   #I again had a long conversation with the patient regarding the pathophysiology of MGUS/small risk of turning into multiple myeloma about 1 %/year. Continue annual surveillance.   # chronic intermittent thrombocytosis- will order MPN work up next visit- ordered.   # Vitiligo- [Dr.Kowalski]- s/p opselera.   # Iron  def- ? NOV 2023- ferritin-10; iron  sat- colonoscopy- 2021-[facility] [Dr.Sakai]; no re: EGD-OFF iron - I sat 32%; ferritin > 100. Monitor for now.   # DISPOSITION: # ordered JAK-2/CALR/MPL- time them for next visit in 12 months # follow up in 12  months- MD; 2 week prior- labs- cbc/cmp;MM panel; k/l light chains; iron  studies/ferritin-- Dr.B

## 2024-05-16 ENCOUNTER — Ambulatory Visit: Admitting: Internal Medicine

## 2024-05-16 ENCOUNTER — Encounter: Payer: Self-pay | Admitting: Internal Medicine

## 2024-05-16 VITALS — BP 118/72 | HR 89 | Temp 97.8°F | Ht 65.0 in | Wt 218.0 lb

## 2024-05-16 DIAGNOSIS — E7800 Pure hypercholesterolemia, unspecified: Secondary | ICD-10-CM | POA: Diagnosis not present

## 2024-05-16 DIAGNOSIS — F439 Reaction to severe stress, unspecified: Secondary | ICD-10-CM | POA: Diagnosis not present

## 2024-05-16 DIAGNOSIS — D472 Monoclonal gammopathy: Secondary | ICD-10-CM | POA: Diagnosis not present

## 2024-05-16 DIAGNOSIS — M25562 Pain in left knee: Secondary | ICD-10-CM

## 2024-05-16 DIAGNOSIS — L80 Vitiligo: Secondary | ICD-10-CM

## 2024-05-16 DIAGNOSIS — I10 Essential (primary) hypertension: Secondary | ICD-10-CM

## 2024-05-16 DIAGNOSIS — R911 Solitary pulmonary nodule: Secondary | ICD-10-CM

## 2024-05-16 DIAGNOSIS — D473 Essential (hemorrhagic) thrombocythemia: Secondary | ICD-10-CM

## 2024-05-16 NOTE — Progress Notes (Signed)
 Subjective:    Patient ID: Kathleen Gould, female    DOB: 11/02/1965, 58 y.o.   MRN: [id number]  Patient here for  Chief Complaint  Patient presents with   Medical Management of Chronic Issues    4 mo f/u    HPI Here for a scheduled follow up -  follow up regarding hypercholesterolemia and hypertension. Working at [employer]. Seeing dermatology for f/u vitiligo. Has improved. Continues on opzelura  cream. Seen ortho - 04/20/24 - evaluation of left knee pain and swelling s/p injury. Had slipped. Xray - mild-moderate arthritis. Recommended modifying weightbearing. F/u with Dr Rennie - 05/10/24 - stable. Continue active surveillance - MGUS. No chest pain or sob reported. No abdominal pain or bowel change reported.    Past Medical History:  Diagnosis Date   Arthritis    Endometriosis    Environmental allergies    GERD (gastroesophageal reflux disease)    Hypercholesterolemia    Hypertension    Uterine fibroid    Past Surgical History:  Procedure Laterality Date   ABDOMINAL HYSTERECTOMY     CESAREAN SECTION     x2   OOPHORECTOMY     S/P left secondary to cyst   THYROGLOSSAL DUCT CYST N/A 05/27/2015   Procedure: THYROGLOSSAL DUCT CYST EXCISION ;  Surgeon: Deward Dolly, MD;  Location: [facility] ORS;  Service: ENT;  Laterality: N/A;   Family History  Problem Relation Age of Onset   Heart attack Father 32   Heart disease Father        bypass surgery   Hypertension Father    Hypercholesterolemia Father    Kidney disease Father    Diabetes Mother    Diabetes Other        aunt   Breast cancer Neg Hx    Social History   Socioeconomic History   Marital status: Married    Spouse name: Not on file   Number of children: 2   Years of education: Not on file   Highest education level: Not on file  Occupational History   Not on file  Tobacco Use   Smoking status: Never   Smokeless tobacco: Never  Vaping Use   Vaping status: Never Used  Substance and Sexual  Activity   Alcohol use: No    Alcohol/week: 0.0 standard drinks of alcohol   Drug use: No   Sexual activity: Not Currently  Other Topics Concern   Not on file  Social History Narrative   Widowed    Social Drivers of Corporate investment banker Strain: Not on file  Food Insecurity: Not on file  Transportation Needs: Not on file  Physical Activity: Not on file  Stress: Not on file  Social Connections: Not on file     Review of Systems  Constitutional:  Negative for appetite change and unexpected weight change.  HENT:  Negative for congestion and sinus pressure.   Respiratory:  Negative for cough, chest tightness and shortness of breath.   Cardiovascular:  Negative for chest pain, palpitations and leg swelling.  Gastrointestinal:  Negative for abdominal pain, diarrhea, nausea and vomiting.  Genitourinary:  Negative for difficulty urinating and dysuria.  Musculoskeletal:  Negative for joint swelling and myalgias.  Skin:  Negative for color change and rash.  Neurological:  Negative for dizziness and headaches.  Psychiatric/Behavioral:  Negative for agitation and dysphoric mood.        Objective:     BP 118/72   Pulse 89   Temp 97.8  F (36.6 C) (Oral)   Ht 5' 5 (1.651 m)   Wt 218 lb (98.9 kg)   LMP 11/06/2000   SpO2 98%   BMI 36.28 kg/m  Wt Readings from Last 3 Encounters:  05/16/24 218 lb (98.9 kg)  05/10/24 218 lb 14.4 oz (99.3 kg)  01/09/24 221 lb 3.2 oz (100.3 kg)    Physical Exam Vitals reviewed.  Constitutional:      General: She is not in acute distress.    Appearance: Normal appearance.  HENT:     Head: Normocephalic and atraumatic.     Right Ear: External ear normal.     Left Ear: External ear normal.     Mouth/Throat:     Pharynx: No oropharyngeal exudate or posterior oropharyngeal erythema.  Eyes:     General: No scleral icterus.       Right eye: No discharge.        Left eye: No discharge.     Conjunctiva/sclera: Conjunctivae normal.  Neck:      Thyroid : No thyromegaly.  Cardiovascular:     Rate and Rhythm: Normal rate and regular rhythm.  Pulmonary:     Effort: No respiratory distress.     Breath sounds: Normal breath sounds. No wheezing.  Abdominal:     General: Bowel sounds are normal.     Palpations: Abdomen is soft.     Tenderness: There is no abdominal tenderness.  Musculoskeletal:        General: No swelling or tenderness.     Cervical back: Neck supple. No tenderness.  Lymphadenopathy:     Cervical: No cervical adenopathy.  Skin:    Findings: No erythema or rash.  Neurological:     Mental Status: She is alert.  Psychiatric:        Mood and Affect: Mood normal.        Behavior: Behavior normal.         Outpatient Encounter Medications as of 05/16/2024  Medication Sig   albuterol  (VENTOLIN  HFA) 108 (90 Base) MCG/ACT inhaler Inhale 2 puffs into the lungs every 4 (four) hours as needed.   atorvastatin  (LIPITOR) 20 MG tablet TAKE 1 TABLET BY MOUTH EVERY DAY   felodipine  (PLENDIL ) 5 MG 24 hr tablet TAKE 1 TABLET (5 MG TOTAL) BY MOUTH DAILY.   fluticasone  (FLONASE ) 50 MCG/ACT nasal spray SPRAY 2 SPRAYS INTO EACH NOSTRIL EVERY DAY   pantoprazole  (PROTONIX ) 40 MG tablet TAKE 1 TABLET BY MOUTH EVERY DAY   Ruxolitinib Phosphate  (OPZELURA ) 1.5 % CREA Apply 1 Application topically 2 (two) times daily. bid to aa arms, chest for vitiligo   [DISCONTINUED] triamterene -hydrochlorothiazide (MAXZIDE-25) 37.5-25 MG tablet TAKE 1/2 TABLET BY MOUTH DAILY   No facility-administered encounter medications on file as of 05/16/2024.     Lab Results  Component Value Date   WBC 7.2 04/26/2024   HGB 13.0 04/26/2024   HCT 39.6 04/26/2024   PLT 449 (H) 04/26/2024   GLUCOSE 110 (H) 04/26/2024   CHOL 220 (H) 01/30/2024   TRIG 66.0 01/30/2024   HDL 80.20 01/30/2024   LDLDIRECT 164.5 07/24/2013   LDLCALC 127 (H) 01/30/2024   ALT 20 04/26/2024   AST 20 04/26/2024   NA 139 04/26/2024   K 3.9 04/26/2024   CL 104 04/26/2024    CREATININE 0.72 04/26/2024   BUN 13 04/26/2024   CO2 26 04/26/2024   TSH 1.68 07/24/2023   INR 1.0 04/21/2014    MM 3D SCREENING MAMMOGRAM BILATERAL BREAST Result Date: 03/15/2024 CLINICAL  DATA:  Screening. EXAM: DIGITAL SCREENING BILATERAL MAMMOGRAM WITH TOMOSYNTHESIS AND CAD TECHNIQUE: Bilateral screening digital craniocaudal and mediolateral oblique mammograms were obtained. Bilateral screening digital breast tomosynthesis was performed. The images were evaluated with computer-aided detection. COMPARISON:  Previous exam(s). ACR Breast Density Category b: There are scattered areas of fibroglandular density. FINDINGS: There are no findings suspicious for malignancy. IMPRESSION: No mammographic evidence of malignancy. A result letter of this screening mammogram will be mailed directly to the patient. RECOMMENDATION: Screening mammogram in one year. (Code:SM-B-01Y) BI-RADS CATEGORY  1: Negative. Electronically Signed   By: Dina  Arceo M.D.   On: 03/15/2024 14:37       Assessment & Plan:  Vitiligo Assessment & Plan: Has improved. Seeing dermatology. Continues on opzelura  cream.   Stress Assessment & Plan: Discussed. Overall appears to be handling things relatively well.    Pure hypercholesterolemia Assessment & Plan: Continue lipitor. Low cholesterol diet and exercise. Follow lipid panel.    Monoclonal gammopathy Assessment & Plan: F/u with Dr Rennie - 05/10/24 - stable. Continue active surveillance - MGUS.   Lung nodule, solitary Assessment & Plan: Saw Dr Isadora.  Recommended f/u chest CT.  She is interested in calcium  score.  CT calcium  score - imaged lungs are clear. Needs to confirm f/u with pulmonary.    Essential hypertension, benign Assessment & Plan: Continue plendil  and triam/hctz.  Blood pressure doing well.  Follow pressures.  Follow metabolic panel.    Essential (hemorrhagic) thrombocythemia (HCC) Assessment & Plan: Being followed by hematology. Felt to be  reactive.    Left knee pain, unspecified chronicity Assessment & Plan: Seen ortho - 04/20/24 - evaluation of left knee pain and swelling s/p injury. Had slipped. Xray - mild-moderate arthritis. Recommended modifying weightbearing. S/p steroid injection 04/2024.       Allena Hamilton, MD

## 2024-05-18 ENCOUNTER — Other Ambulatory Visit: Payer: Self-pay | Admitting: Internal Medicine

## 2024-05-23 ENCOUNTER — Encounter: Payer: Self-pay | Admitting: Internal Medicine

## 2024-05-23 DIAGNOSIS — M25562 Pain in left knee: Secondary | ICD-10-CM | POA: Insufficient documentation

## 2024-05-25 ENCOUNTER — Encounter: Payer: Self-pay | Admitting: Internal Medicine

## 2024-05-25 NOTE — Assessment & Plan Note (Signed)
Being followed by hematology.  Felt to be reactive.

## 2024-05-25 NOTE — Assessment & Plan Note (Signed)
Continue plendil and triam/hctz.  Blood pressure doing well.  Follow pressures.  Follow metabolic panel.  

## 2024-05-25 NOTE — Assessment & Plan Note (Signed)
 F/u with Dr Rennie - 05/10/24 - stable. Continue active surveillance - MGUS.

## 2024-05-25 NOTE — Assessment & Plan Note (Signed)
 Has improved. Seeing dermatology. Continues on opzelura  cream.

## 2024-05-25 NOTE — Assessment & Plan Note (Signed)
Continue lipitor.  Low cholesterol diet and exercise.  Follow lipid panel.

## 2024-05-25 NOTE — Assessment & Plan Note (Signed)
 Discussed.  Overall appears to be handling things relatively well.

## 2024-05-25 NOTE — Assessment & Plan Note (Signed)
 Saw Dr Isadora.  Recommended f/u chest CT.  She is interested in calcium  score.  CT calcium  score - imaged lungs are clear. Needs to confirm f/u with pulmonary.

## 2024-05-25 NOTE — Assessment & Plan Note (Signed)
 Seen ortho - 04/20/24 - evaluation of left knee pain and swelling s/p injury. Had slipped. Xray - mild-moderate arthritis. Recommended modifying weightbearing. S/p steroid injection 04/2024.

## 2024-06-14 ENCOUNTER — Other Ambulatory Visit: Payer: Self-pay | Admitting: Internal Medicine

## 2024-09-16 ENCOUNTER — Encounter: Payer: Self-pay | Admitting: Internal Medicine

## 2024-09-16 ENCOUNTER — Ambulatory Visit: Admitting: Internal Medicine

## 2024-09-16 VITALS — BP 122/68 | HR 95 | Temp 97.6°F | Ht 65.0 in | Wt 198.2 lb

## 2024-09-16 DIAGNOSIS — F439 Reaction to severe stress, unspecified: Secondary | ICD-10-CM

## 2024-09-16 DIAGNOSIS — D472 Monoclonal gammopathy: Secondary | ICD-10-CM | POA: Diagnosis not present

## 2024-09-16 DIAGNOSIS — L80 Vitiligo: Secondary | ICD-10-CM

## 2024-09-16 DIAGNOSIS — E7800 Pure hypercholesterolemia, unspecified: Secondary | ICD-10-CM | POA: Diagnosis not present

## 2024-09-16 DIAGNOSIS — D473 Essential (hemorrhagic) thrombocythemia: Secondary | ICD-10-CM | POA: Diagnosis not present

## 2024-09-16 DIAGNOSIS — D649 Anemia, unspecified: Secondary | ICD-10-CM | POA: Diagnosis not present

## 2024-09-16 DIAGNOSIS — K219 Gastro-esophageal reflux disease without esophagitis: Secondary | ICD-10-CM | POA: Diagnosis not present

## 2024-09-16 DIAGNOSIS — Z0000 Encounter for general adult medical examination without abnormal findings: Secondary | ICD-10-CM | POA: Diagnosis not present

## 2024-09-16 DIAGNOSIS — R911 Solitary pulmonary nodule: Secondary | ICD-10-CM | POA: Diagnosis not present

## 2024-09-16 DIAGNOSIS — I10 Essential (primary) hypertension: Secondary | ICD-10-CM | POA: Diagnosis not present

## 2024-09-16 DIAGNOSIS — Z1231 Encounter for screening mammogram for malignant neoplasm of breast: Secondary | ICD-10-CM | POA: Diagnosis not present

## 2024-09-16 DIAGNOSIS — Z124 Encounter for screening for malignant neoplasm of cervix: Secondary | ICD-10-CM

## 2024-09-16 MED ORDER — FELODIPINE ER 5 MG PO TB24: 5.0000 mg | ORAL_TABLET | Freq: Every day | ORAL | 1 refills | Status: AC

## 2024-09-16 MED ORDER — TRIAMTERENE-HCTZ 37.5-25 MG PO TABS: 0.5000 | ORAL_TABLET | Freq: Every day | ORAL | 1 refills | Status: AC

## 2024-09-16 MED ORDER — PANTOPRAZOLE SODIUM 40 MG PO TBEC: 40.0000 mg | DELAYED_RELEASE_TABLET | Freq: Every day | ORAL | 1 refills | Status: AC

## 2024-09-16 NOTE — Assessment & Plan Note (Addendum)
 Physical today 09/16/24.  Mammogram 03/12/24 - Birads I.  Colonoscopy 07/2020 - (Dr Tye) - recommended f/u in 10 years.  PAP 03/23/21 - negative with negative HPV. Wanted to hold.

## 2024-09-16 NOTE — Progress Notes (Signed)
 "  Subjective:    Patient ID: Kathleen Gould, female    DOB: February 06, 1966, 59 y.o.   MRN: [id number]  Patient here for  Chief Complaint  Patient presents with   Annual Exam    HPI Here for a physical exam. Evaluated 08/31/24 - pain and swelling left eyelid. Treated with warm compresses and erythromycin ointment. Evaluated 05/23/24 - Dr Sharrie - f/u left knee pain s/p fall. Initially seen [facility] - treated with meloxicam. Cortisone injection 05/23/24. Has been followed by dermatology for f/u vitiligo. Continues on opzelura  cream. Had f/u with Dr Rennie - 05/10/24 - continue active surveillance - [facility]US. Discussed her previous F/u Dr Isadora - seen 01/2023. Recommended f/u chest CT. Discussed overdue chest CT. Declines. States insurance would not cover. No chest pain. Breathing stable. No abdominal pain or cramping. No acid reflux. Discussed - protonix  20mg  every other day.    Past Medical History:  Diagnosis Date   Arthritis    Endometriosis    Environmental allergies    GERD (gastroesophageal reflux disease)    Hypercholesterolemia    Hypertension    Uterine fibroid    Past Surgical History:  Procedure Laterality Date   ABDOMINAL HYSTERECTOMY     CESAREAN SECTION     x2   OOPHORECTOMY     S/P left secondary to cyst   THYROGLOSSAL DUCT CYST N/A 05/27/2015   Procedure: THYROGLOSSAL DUCT CYST EXCISION ;  Surgeon: Deward Dolly, MD;  Location: [facility] ORS;  Service: ENT;  Laterality: N/A;   Family History  Problem Relation Age of Onset   Heart attack Father 75   Heart disease Father        bypass surgery   Hypertension Father    Hypercholesterolemia Father    Kidney disease Father    Diabetes Mother    Diabetes Other        aunt   Breast cancer Neg Hx    Social History   Socioeconomic History   Marital status: Married    Spouse name: Not on file   Number of children: 2   Years of education: Not on file   Highest education level: Not on file  Occupational History   Not on  file  Tobacco Use   Smoking status: Never   Smokeless tobacco: Never  Vaping Use   Vaping status: Never Used  Substance and Sexual Activity   Alcohol use: No    Alcohol/week: 0.0 standard drinks of alcohol   Drug use: No   Sexual activity: Not Currently  Other Topics Concern   Not on file  Social History Narrative   Widowed    Social Drivers of Health   Tobacco Use: Low Risk (09/21/2024)   Patient History    Smoking Tobacco Use: Never    Smokeless Tobacco Use: Never    Passive Exposure: Not on file  Financial Resource Strain: Not on file  Food Insecurity: Not on file  Transportation Needs: Not on file  Physical Activity: Not on file  Stress: Not on file  Social Connections: Not on file  Depression (PHQ2-9): Low Risk (09/16/2024)   Depression (PHQ2-9)    PHQ-2 Score: 2  Alcohol Screen: Not on file  Housing: Unknown (12/04/2023)   Received from [facility]   Epic    Unable to Pay for Housing in the Last Year: Not on file    Number of Times Moved in the Last Year: Not on file    At any time in the  past 12 months, were you homeless or living in a shelter (including now)?: No  Utilities: Not on file  Health Literacy: Not on file     Review of Systems  Constitutional:  Negative for appetite change and unexpected weight change.  HENT:  Negative for congestion, sinus pressure and sore throat.   Eyes:  Negative for pain and visual disturbance.  Respiratory:  Negative for cough, chest tightness and shortness of breath.   Cardiovascular:  Negative for chest pain, palpitations and leg swelling.  Gastrointestinal:  Negative for abdominal pain, diarrhea, nausea and vomiting.  Genitourinary:  Negative for difficulty urinating and dysuria.  Musculoskeletal:  Negative for joint swelling and myalgias.  Skin:  Negative for color change and rash.  Neurological:  Negative for dizziness and headaches.  Hematological:  Negative for adenopathy. Does not bruise/bleed  easily.  Psychiatric/Behavioral:  Negative for agitation and dysphoric mood.        Objective:     BP 122/68   Pulse 95   Temp 97.6 F (36.4 C) (Oral)   Ht 5' 5 (1.651 m)   Wt 198 lb 3.2 oz (89.9 kg)   LMP 11/06/2000   SpO2 100%   BMI 32.98 kg/m  Wt Readings from Last 3 Encounters:  09/16/24 198 lb 3.2 oz (89.9 kg)  05/16/24 218 lb (98.9 kg)  05/10/24 218 lb 14.4 oz (99.3 kg)    Physical Exam Vitals reviewed.  Constitutional:      General: She is not in acute distress.    Appearance: Normal appearance. She is well-developed.  HENT:     Head: Normocephalic and atraumatic.     Right Ear: External ear normal.     Left Ear: External ear normal.     Mouth/Throat:     Pharynx: No oropharyngeal exudate or posterior oropharyngeal erythema.  Eyes:     General: No scleral icterus.       Right eye: No discharge.        Left eye: No discharge.     Conjunctiva/sclera: Conjunctivae normal.  Neck:     Thyroid : No thyromegaly.  Cardiovascular:     Rate and Rhythm: Normal rate and regular rhythm.  Pulmonary:     Effort: No tachypnea, accessory muscle usage or respiratory distress.     Breath sounds: Normal breath sounds. No decreased breath sounds or wheezing.  Chest:  Breasts:    Right: No inverted nipple, mass, nipple discharge or tenderness (no axillary adenopathy).     Left: No inverted nipple, mass, nipple discharge or tenderness (no axilarry adenopathy).  Abdominal:     General: Bowel sounds are normal.     Palpations: Abdomen is soft.     Tenderness: There is no abdominal tenderness.  Musculoskeletal:        General: No swelling or tenderness.     Cervical back: Neck supple.  Lymphadenopathy:     Cervical: No cervical adenopathy.  Skin:    Findings: No erythema or rash.  Neurological:     Mental Status: She is alert and oriented to person, place, and time.  Psychiatric:        Mood and Affect: Mood normal.        Behavior: Behavior normal.          Outpatient Encounter Medications as of 09/16/2024  Medication Sig   albuterol  (VENTOLIN  HFA) 108 (90 Base) MCG/ACT inhaler Inhale 2 puffs into the lungs every 4 (four) hours as needed.   atorvastatin  (LIPITOR) 20 MG tablet TAKE  1 TABLET BY MOUTH EVERY DAY   fluticasone  (FLONASE ) 50 MCG/ACT nasal spray SPRAY 2 SPRAYS INTO EACH NOSTRIL EVERY DAY   Ruxolitinib Phosphate  (OPZELURA ) 1.5 % CREA Apply 1 Application topically 2 (two) times daily. bid to aa arms, chest for vitiligo   felodipine  (PLENDIL ) 5 MG 24 hr tablet Take 1 tablet (5 mg total) by mouth daily.   pantoprazole  (PROTONIX ) 40 MG tablet Take 1 tablet (40 mg total) by mouth daily.   triamterene -hydrochlorothiazide (MAXZIDE-25) 37.5-25 MG tablet Take 0.5 tablets by mouth daily.   [DISCONTINUED] felodipine  (PLENDIL ) 5 MG 24 hr tablet TAKE 1 TABLET (5 MG TOTAL) BY MOUTH DAILY.   [DISCONTINUED] pantoprazole  (PROTONIX ) 40 MG tablet TAKE 1 TABLET BY MOUTH EVERY DAY   [DISCONTINUED] triamterene -hydrochlorothiazide (MAXZIDE-25) 37.5-25 MG tablet TAKE 1/2 TABLET BY MOUTH DAILY   No facility-administered encounter medications on file as of 09/16/2024.     Lab Results  Component Value Date   WBC 7.2 04/26/2024   HGB 13.0 04/26/2024   HCT 39.6 04/26/2024   PLT 449 (H) 04/26/2024   GLUCOSE 111 (H) 09/16/2024   CHOL 205 (H) 09/16/2024   TRIG 55.0 09/16/2024   HDL 70.80 09/16/2024   LDLDIRECT 164.5 07/24/2013   LDLCALC 123 (H) 09/16/2024   ALT 16 09/16/2024   AST 15 09/16/2024   NA 138 09/16/2024   K 3.5 09/16/2024   CL 100 09/16/2024   CREATININE 0.70 09/16/2024   BUN 9 09/16/2024   CO2 29 09/16/2024   TSH 1.68 07/24/2023   INR 1.0 04/21/2014    MM 3D SCREENING MAMMOGRAM BILATERAL BREAST Result Date: 03/15/2024 CLINICAL DATA:  Screening. EXAM: DIGITAL SCREENING BILATERAL MAMMOGRAM WITH TOMOSYNTHESIS AND CAD TECHNIQUE: Bilateral screening digital craniocaudal and mediolateral oblique mammograms were obtained. Bilateral screening  digital breast tomosynthesis was performed. The images were evaluated with computer-aided detection. COMPARISON:  Previous exam(s). ACR Breast Density Category b: There are scattered areas of fibroglandular density. FINDINGS: There are no findings suspicious for malignancy. IMPRESSION: No mammographic evidence of malignancy. A result letter of this screening mammogram will be mailed directly to the patient. RECOMMENDATION: Screening mammogram in one year. (Code:SM-B-01Y) BI-RADS CATEGORY  1: Negative. Electronically Signed   By: Dina  Arceo M.D.   On: 03/15/2024 14:37       Assessment & Plan:  Health care maintenance Assessment & Plan: Physical today 09/16/24.  Mammogram 03/12/24 - Birads I.  Colonoscopy 07/2020 - (Dr Tye) - recommended f/u in 10 years.  PAP 03/23/21 - negative with negative HPV. Wanted to hold.    Essential hypertension, benign Assessment & Plan: Continue plendil  and triam/hctz.  Blood pressure doing well.  Follow pressures.  Follow metabolic panel.   Orders: -     Basic metabolic panel with GFR  Pure hypercholesterolemia Assessment & Plan: Continue lipitor. Low cholesterol diet and exercise. Follow lipid panel.   Orders: -     Hepatic function panel -     Lipid panel  MGUS (monoclonal gammopathy of unknown significance)  Cervical cancer screening  Encounter for screening mammogram for malignant neoplasm of breast  Anemia, unspecified type Assessment & Plan: Followed by hematology.    Gastroesophageal reflux disease, unspecified whether esophagitis present Assessment & Plan: On protonix . Upper symptoms controlled. Discussed - protonix  20mg  every other day. Follow.    Essential (hemorrhagic) thrombocythemia (HCC) Assessment & Plan: Being followed by hematology. Felt to be reactive.    Lung nodule, solitary Assessment & Plan: Saw Dr Isadora.  Recommended f/u chest CT.  She  is interested in calcium  score.  CT calcium  score - imaged lungs are clear.  Discussed f/u chest CT today. She declines stating insurance will not cover.    Monoclonal gammopathy Assessment & Plan: F/u with Dr Rennie - 05/10/24 - stable. Continue active surveillance - MGUS.   Stress Assessment & Plan: Discussed. Overall appears to be doing well.    Vitiligo Assessment & Plan: Seeing dermatology. Continues on opzelura  cream.   Other orders -     Felodipine  ER; Take 1 tablet (5 mg total) by mouth daily.  Dispense: 90 tablet; Refill: 1 -     Pantoprazole  Sodium; Take 1 tablet (40 mg total) by mouth daily.  Dispense: 90 tablet; Refill: 1 -     Triamterene -HCTZ; Take 0.5 tablets by mouth daily.  Dispense: 45 tablet; Refill: 1     Allena Hamilton, MD "

## 2024-09-17 LAB — HEPATIC FUNCTION PANEL
ALT: 16 U/L (ref 3–35)
AST: 15 U/L (ref 5–37)
Albumin: 4.5 g/dL (ref 3.5–5.2)
Alkaline Phosphatase: 84 U/L (ref 39–117)
Bilirubin, Direct: 0.1 mg/dL (ref 0.1–0.3)
Total Bilirubin: 0.6 mg/dL (ref 0.2–1.2)
Total Protein: 7.9 g/dL (ref 6.0–8.3)

## 2024-09-17 LAB — BASIC METABOLIC PANEL WITH GFR
BUN: 9 mg/dL (ref 6–23)
CO2: 29 meq/L (ref 19–32)
Calcium: 10.1 mg/dL (ref 8.4–10.5)
Chloride: 100 meq/L (ref 96–112)
Creatinine, Ser: 0.7 mg/dL (ref 0.40–1.20)
GFR: 94.07 mL/min
Glucose, Bld: 111 mg/dL — ABNORMAL HIGH (ref 70–99)
Potassium: 3.5 meq/L (ref 3.5–5.1)
Sodium: 138 meq/L (ref 135–145)

## 2024-09-17 LAB — LIPID PANEL
Cholesterol: 205 mg/dL — ABNORMAL HIGH (ref 28–200)
HDL: 70.8 mg/dL
LDL Cholesterol: 123 mg/dL — ABNORMAL HIGH (ref 10–99)
NonHDL: 133.74
Total CHOL/HDL Ratio: 3
Triglycerides: 55 mg/dL (ref 10.0–149.0)
VLDL: 11 mg/dL (ref 0.0–40.0)

## 2024-09-18 ENCOUNTER — Ambulatory Visit: Payer: Self-pay | Admitting: Internal Medicine

## 2024-09-21 ENCOUNTER — Encounter: Payer: Self-pay | Admitting: Internal Medicine

## 2024-09-21 NOTE — Assessment & Plan Note (Signed)
 Saw Dr Isadora.  Recommended f/u chest CT.  She is interested in calcium  score.  CT calcium  score - imaged lungs are clear. Discussed f/u chest CT today. She declines stating insurance will not cover.

## 2024-09-21 NOTE — Assessment & Plan Note (Signed)
 On protonix . Upper symptoms controlled. Discussed - protonix  20mg  every other day. Follow.

## 2024-09-21 NOTE — Assessment & Plan Note (Signed)
 Followed by hematology

## 2024-09-21 NOTE — Assessment & Plan Note (Signed)
Continue plendil and triam/hctz.  Blood pressure doing well.  Follow pressures.  Follow metabolic panel.  

## 2024-09-21 NOTE — Assessment & Plan Note (Signed)
Being followed by hematology.  Felt to be reactive.

## 2024-09-21 NOTE — Assessment & Plan Note (Signed)
Continue lipitor.  Low cholesterol diet and exercise.  Follow lipid panel.

## 2024-09-21 NOTE — Assessment & Plan Note (Signed)
 Seeing dermatology. Continues on opzelura  cream.

## 2024-09-21 NOTE — Assessment & Plan Note (Signed)
 F/u with Dr Rennie - 05/10/24 - stable. Continue active surveillance - MGUS.

## 2024-09-21 NOTE — Assessment & Plan Note (Signed)
 Discussed. Overall appears to be doing well.

## 2025-02-04 ENCOUNTER — Ambulatory Visit: Admitting: Internal Medicine

## 2025-03-27 ENCOUNTER — Ambulatory Visit: Admitting: Dermatology

## 2025-04-25 ENCOUNTER — Other Ambulatory Visit

## 2025-05-12 ENCOUNTER — Ambulatory Visit: Admitting: Internal Medicine
